# Patient Record
Sex: Female | Born: 1935 | Race: White | Hispanic: No | Smoking: Never smoker
Health system: Southern US, Community
[De-identification: ages and names within clinical notes are randomized; demographics above are authoritative.]

## PROBLEM LIST (undated history)

## (undated) DIAGNOSIS — K219 Gastro-esophageal reflux disease without esophagitis: Secondary | ICD-10-CM

## (undated) DIAGNOSIS — R112 Nausea with vomiting, unspecified: Secondary | ICD-10-CM

## (undated) DIAGNOSIS — E039 Hypothyroidism, unspecified: Secondary | ICD-10-CM

## (undated) DIAGNOSIS — Z9889 Other specified postprocedural states: Secondary | ICD-10-CM

## (undated) DIAGNOSIS — D509 Iron deficiency anemia, unspecified: Secondary | ICD-10-CM

## (undated) DIAGNOSIS — N183 Chronic kidney disease, stage 3 unspecified: Secondary | ICD-10-CM

## (undated) DIAGNOSIS — J9601 Acute respiratory failure with hypoxia: Secondary | ICD-10-CM

## (undated) DIAGNOSIS — Z9289 Personal history of other medical treatment: Secondary | ICD-10-CM

## (undated) DIAGNOSIS — I10 Essential (primary) hypertension: Secondary | ICD-10-CM

## (undated) DIAGNOSIS — M109 Gout, unspecified: Secondary | ICD-10-CM

## (undated) DIAGNOSIS — M1990 Unspecified osteoarthritis, unspecified site: Secondary | ICD-10-CM

## (undated) DIAGNOSIS — C801 Malignant (primary) neoplasm, unspecified: Secondary | ICD-10-CM

## (undated) DIAGNOSIS — H9190 Unspecified hearing loss, unspecified ear: Secondary | ICD-10-CM

## (undated) DIAGNOSIS — I5030 Unspecified diastolic (congestive) heart failure: Secondary | ICD-10-CM

## (undated) DIAGNOSIS — F329 Major depressive disorder, single episode, unspecified: Secondary | ICD-10-CM

## (undated) DIAGNOSIS — F32A Depression, unspecified: Secondary | ICD-10-CM

## (undated) DIAGNOSIS — K589 Irritable bowel syndrome without diarrhea: Secondary | ICD-10-CM

## (undated) HISTORY — PX: CATARACT EXTRACTION: SUR2

## (undated) HISTORY — PX: JOINT REPLACEMENT: SHX530

## (undated) HISTORY — PX: TOTAL KNEE ARTHROPLASTY: SHX125

## (undated) HISTORY — PX: BACK SURGERY: SHX140

## (undated) HISTORY — PX: VAGINAL HYSTERECTOMY: SUR661

---
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
## 1958-10-16 HISTORY — PX: TUBAL LIGATION: SHX77

## 1978-10-16 HISTORY — PX: LUMBAR DISC SURGERY: SHX700

## 1988-10-15 HISTORY — PX: CARDIAC CATHETERIZATION: SHX172

## 1997-10-24 ENCOUNTER — Observation Stay (HOSPITAL_COMMUNITY): Admission: RE | Admit: 1997-10-24 | Discharge: 1997-10-24 | Payer: Self-pay | Admitting: General Surgery

## 1998-02-14 DIAGNOSIS — Z9289 Personal history of other medical treatment: Secondary | ICD-10-CM

## 1998-02-14 HISTORY — DX: Personal history of other medical treatment: Z92.89

## 1998-11-03 ENCOUNTER — Encounter: Payer: Self-pay | Admitting: Specialist

## 1998-11-10 ENCOUNTER — Inpatient Hospital Stay (HOSPITAL_COMMUNITY): Admission: RE | Admit: 1998-11-10 | Discharge: 1998-11-14 | Payer: Self-pay | Admitting: Specialist

## 1999-03-30 ENCOUNTER — Encounter: Payer: Self-pay | Admitting: Emergency Medicine

## 1999-03-30 ENCOUNTER — Emergency Department (HOSPITAL_COMMUNITY): Admission: EM | Admit: 1999-03-30 | Discharge: 1999-03-30 | Payer: Self-pay | Admitting: Emergency Medicine

## 1999-10-05 ENCOUNTER — Encounter: Admission: RE | Admit: 1999-10-05 | Discharge: 1999-10-05 | Payer: Self-pay | Admitting: Family Medicine

## 1999-10-05 ENCOUNTER — Encounter: Payer: Self-pay | Admitting: Family Medicine

## 2000-10-05 ENCOUNTER — Encounter: Admission: RE | Admit: 2000-10-05 | Discharge: 2000-10-05 | Payer: Self-pay | Admitting: Obstetrics & Gynecology

## 2000-10-05 ENCOUNTER — Encounter: Payer: Self-pay | Admitting: Obstetrics & Gynecology

## 2001-09-18 ENCOUNTER — Encounter: Admission: RE | Admit: 2001-09-18 | Discharge: 2001-09-18 | Payer: Self-pay | Admitting: Gastroenterology

## 2001-09-18 ENCOUNTER — Encounter: Payer: Self-pay | Admitting: Gastroenterology

## 2001-12-12 ENCOUNTER — Encounter: Payer: Self-pay | Admitting: Family Medicine

## 2001-12-12 ENCOUNTER — Encounter: Admission: RE | Admit: 2001-12-12 | Discharge: 2001-12-12 | Payer: Self-pay | Admitting: Family Medicine

## 2002-06-19 ENCOUNTER — Ambulatory Visit (HOSPITAL_COMMUNITY): Admission: RE | Admit: 2002-06-19 | Discharge: 2002-06-19 | Payer: Self-pay | Admitting: Gastroenterology

## 2003-03-21 ENCOUNTER — Encounter: Admission: RE | Admit: 2003-03-21 | Discharge: 2003-03-21 | Payer: Self-pay | Admitting: Family Medicine

## 2004-05-06 ENCOUNTER — Encounter: Admission: RE | Admit: 2004-05-06 | Discharge: 2004-05-06 | Payer: Self-pay | Admitting: Family Medicine

## 2004-11-29 ENCOUNTER — Encounter: Admission: RE | Admit: 2004-11-29 | Discharge: 2004-11-29 | Payer: Self-pay | Admitting: Nephrology

## 2005-02-14 HISTORY — PX: TOTAL KNEE ARTHROPLASTY: SHX125

## 2005-04-29 ENCOUNTER — Ambulatory Visit: Payer: Self-pay

## 2005-09-07 ENCOUNTER — Encounter: Admission: RE | Admit: 2005-09-07 | Discharge: 2005-09-07 | Payer: Self-pay | Admitting: Family Medicine

## 2005-11-29 ENCOUNTER — Inpatient Hospital Stay (HOSPITAL_COMMUNITY): Admission: RE | Admit: 2005-11-29 | Discharge: 2005-12-02 | Payer: Self-pay | Admitting: Orthopedic Surgery

## 2006-03-09 ENCOUNTER — Ambulatory Visit: Payer: Self-pay | Admitting: Cardiovascular Disease

## 2006-03-17 ENCOUNTER — Ambulatory Visit: Payer: Self-pay

## 2006-03-31 ENCOUNTER — Ambulatory Visit: Payer: Self-pay | Admitting: Cardiovascular Disease

## 2006-04-05 ENCOUNTER — Inpatient Hospital Stay (HOSPITAL_BASED_OUTPATIENT_CLINIC_OR_DEPARTMENT_OTHER): Admission: RE | Admit: 2006-04-05 | Discharge: 2006-04-05 | Payer: Self-pay | Admitting: Cardiovascular Disease

## 2006-04-05 ENCOUNTER — Ambulatory Visit: Payer: Self-pay | Admitting: Cardiovascular Disease

## 2006-04-13 ENCOUNTER — Ambulatory Visit: Payer: Self-pay | Admitting: Cardiovascular Disease

## 2006-04-13 LAB — CONVERTED CEMR LAB
CO2: 29 meq/L (ref 19–32)
Calcium: 9.7 mg/dL (ref 8.4–10.5)
GFR calc Af Amer: 44 mL/min
GFR calc non Af Amer: 36 mL/min
Glucose, Bld: 91 mg/dL (ref 70–99)
Potassium: 4.5 meq/L (ref 3.5–5.1)

## 2006-09-28 ENCOUNTER — Encounter: Admission: RE | Admit: 2006-09-28 | Discharge: 2006-09-28 | Payer: Self-pay | Admitting: Family Medicine

## 2007-10-24 ENCOUNTER — Encounter: Admission: RE | Admit: 2007-10-24 | Discharge: 2007-10-24 | Payer: Self-pay | Admitting: Family Medicine

## 2008-05-22 ENCOUNTER — Inpatient Hospital Stay (HOSPITAL_COMMUNITY): Admission: RE | Admit: 2008-05-22 | Discharge: 2008-05-23 | Payer: Self-pay | Admitting: Neurological Surgery

## 2008-11-11 ENCOUNTER — Encounter: Admission: RE | Admit: 2008-11-11 | Discharge: 2008-11-11 | Payer: Self-pay | Admitting: Family Medicine

## 2009-11-13 ENCOUNTER — Encounter: Admission: RE | Admit: 2009-11-13 | Discharge: 2009-11-13 | Payer: Self-pay | Admitting: Family Medicine

## 2010-05-26 LAB — BASIC METABOLIC PANEL
BUN: 16 mg/dL (ref 6–23)
Calcium: 10.3 mg/dL (ref 8.4–10.5)
Creatinine, Ser: 1.38 mg/dL — ABNORMAL HIGH (ref 0.4–1.2)
GFR calc non Af Amer: 38 mL/min — ABNORMAL LOW (ref >60)
Glucose, Bld: 106 mg/dL — ABNORMAL HIGH (ref 70–99)
Potassium: 4.2 mEq/L (ref 3.5–5.1)

## 2010-05-26 LAB — CBC
Platelets: 217 10*3/uL (ref 150–400)
RDW: 14.3 % (ref 11.5–15.5)
WBC: 9.6 10*3/uL (ref 4.0–10.5)

## 2010-06-29 NOTE — Op Note (Signed)
NAMETAMAYA, FOARD NO.:  [id number]   MEDICAL RECORD NO.:  [id number]          PATIENT TYPE:  INP   LOCATION:  [id number]                         FACILITY:  [facility]   PHYSICIAN:  Earleen Newport, M.D.  DATE OF BIRTH:  December 01, 1933   DATE OF PROCEDURE:  05/22/2008  DATE OF DISCHARGE:                               OPERATIVE REPORT   PREOPERATIVE DIAGNOSES:  Lumbar spondylosis and lumbar stenosis, L3-L4  with bilateral lumbar radiculopathy.   POSTOPERATIVE DIAGNOSES:  Lumbar spondylosis and lumbar stenosis, L3-L4  with bilateral lumbar radiculopathy.   PROCEDURES:  Bilateral laminotomies and foraminotomies L3-4 with  operating microscope microdissection technique.   SURGEON:  Earleen Newport, MD   FIRST ASSISTANT:  Marchia Meiers. Vertell Limber, MD   ANESTHESIA:  General endotracheal.   INDICATIONS:  Stacy Morris is a 77 year old individual, who has had  progressive difficulty with walking because of symptoms of neurogenic  claudication combined with lumbar radiculopathy.  She has evidence of  advanced spondylitic stenosis at L3-L4 with retrolisthesis of  approximately 3 mm and severe hypertrophy of her posterior elements  causing compromise in the central canal, but also in the exiting L4  nerve roots.  She is now being taken to the operating room to undergo  surgical decompression via bilateral laminotomies and foraminotomies.   PROCEDURE:  The patient was brought to the operating room supine on the  stretcher.  After smooth induction of general endotracheal anesthesia,  she was turned prone.  The back was prepped with alcohol and DuraPrep  and draped in sterile fashion.  A vertical incision was created over the  L3-L4 area, which was localized with a radiograph.  Dissection was  carried down to the lumbodorsal fascia, which was opened on either side  of midline.  Subperiosteal dissection was performed to reveal the  interlaminar space at L3-L4.  Ligamentous material was  removed from the  posterior arch of the spine and then a high-speed bur was used with a 5-  mm acorn bit to remove the inferior margin of lamina of L3 out to the  medial wall of facet.  Partial facetectomy was also performed.  A 2.8-mm  dissecting tool was then used to remove the superior medial portion of  facet of L4.  Yellow ligament was thickened and redundant in this area,  was taken up with 2- and 3-mm Kerrison punch.  Ultimately, the common  dural tube was identified.  Dissection was then carefully performed  cephalad under the arch of L3 to remove ligamentous material and some  bony hypertrophy and also to widen, partial facetectomy had been  performed.  Ultimately, a common dural tube and the takeoff the L4 nerve  root was decompressed, and the decompression removed also ligamentous  material that was compromising the L3 nerve root superiorly.  This was  first done on the left side.  A similar procedure was then carried out  on the right side where similar findings were noted.  Ultimately, the  common dural tube and both L3 and L4 nerve roots were bilaterally  decompressed.  Hemostasis from epidural veins  was checked with some  bipolar cautery.  The disk space itself was then examined.  There was  noted to be the retrolisthesis of L3-L4 with some bulging centrally of  the disk; however, there was no tear in the ligament.  At this point,  the area was irrigated with antibiotic irrigating solution proximally  0.5 mL of Marcaine was injected into the  posterior longitudinal ligament and then the lumbodorsal fascia was  closed with #1 Vicryl in interrupted fashion, 2-0 Vicryl was used on  subcutaneous tissues, 3-0 Vicryl subcuticularly.  A 20 mL of Marcaine  was injected into the paraspinous musculature also.      Earleen Newport, M.D.  Electronically Signed     HJE/MEDQ  D:  05/22/2008  T:  05/22/2008  Job:  [id number]

## 2010-07-02 NOTE — Assessment & Plan Note (Signed)
[facility] OFFICE NOTE   NALAYSIA, [name]                      MRN:          [id number]  DATE:03/09/2006                            DOB:          1933-04-19    Stacy Morris is a delightful 77 year old patient I have known for a long  time.  I have taken care of her husband, Stacy Morris, for quite some time.  He  has significant heart problems with cardiomyopathy, coronary disease,  congestive heart failure and aortic valve replacement.   The patient has had coronary risk factors which include hypertension,  family history for coronary disease.   In June 2007 while waiting for her husband to get a PIC line, she had a  presyncopal episode with some nausea.  Workup for this was negative, and  she has not had any recurrences.  There was a question of whether this  was vasovagal event.  In talking to the patient, she has had over the  last month or so a couple of episodes of resting chest and jaw pain.  She actually does have a cavity in the right side of her jaw which needs  filling.  There has also been a question of some discharge from her  right ear.  The jaw pain can occur at night.  It has woken her up on 2  occasions.   It is not classically exertional.   The patient has not had a previous stress test or cardiac workup.  There  is no history of coronary disease or valvular disease.   She is a nonsmoker and has not had any chronic lung problems.   REVIEW OF SYSTEMS:  There is a question of previous problems with her  kidneys from her childhood, however, we did an ultrasound of her kidneys  here in March 2007, and there was no evidence of renal artery stenosis.  They kidneys were of normal size with the right kidney being 9.3 cm and  the left being 10.  She does follow up with a nephrologist here in town.  Her creatinine is usually normal, although I think this Monday it was up  to 1.7.   Her blood pressure  has been fluctuating recently, and her  hydrochlorothiazide was added back.   FAMILY HISTORY:  Remarkable for her mother dying of a heart problem, but  at age 26.  Father had heart problems in his 105s including strokes.   MEDICATIONS:  1. Lisinopril 20 a day.  2. Atenolol 50 a day.  3. Aspirin a day.  4. Synthroid 50 mcg a day.  5. Cholestyramine.  6. Hydrochlorothiazide 12.5 a day.  7. Prilosec.  8. Os-Cal.   She is retired.  Her husband's health is improving, but still fragile.  They just celebrated their 53rd anniversary this past Tuesday.   She has had previous gallbladder surgery, hysterectomy and 2 knee  replacements.  Despite this, she actually walks fairly well.   EXAMINATION:  Blood pressure is 138/70.  Pulse is 70 and regular.  HEENT:  Normal.  LUNGS:  Clear.  Her carotids  are normal.  There is no bruits.  There is an S1 and S2.  Normal heart sounds.  ABDOMEN:  Benign.  She has had a hysterectomy.  She is status post bilateral knee replacement.  Pulses are intact with  no edema.   Her EKG is essentially normal.   IMPRESSION:  Atypical chest and jaw pain.  I encouraged her to go see  the dentist and possibly an ears, nose and throat doctor.  She probably  needs to be on some antibiotics.  The pain is atypical for angina in  that it is nonexertional.  However, due to her risk factors and age, she  should have a stress Myoview.  I think she can walk for this.   Her hypertension seems reasonably well controlled.  She will continue  her Lisinopril.  She will have to follow up with [facility] in regards to possible prerenal azotemia.  It may be that her  hydrochlorothiazide should be stopped.  Again, I do not know all of the  details about her kidney problems, but I suspect she has had the 24-hour  urine protein in the past.  She clearly does not have an atrophic  kidney, and at least her renal Duplex here in the office would suggest  that she can  continue her ACE inhibitor for blood pressure control.   As long as her stress Myoview is normal, I will see her back only when I  see her husband, Stacy Morris, and she can follow up with [place] regarding  her general medical care.   Again, I encouraged her strongly to see a dentist and get some of her  ears, nose and throat issues resolved.  Also, follow up with the  nephrologist in terms of her kidneys.     Wallis Bamberg. Johnsie Cancel, MD, [facility]  Electronically Signed    PCN/MedQ  DD: 03/09/2006  DT: 03/09/2006  Job #: [id number]   cc:   Carolann Littler, M.D.  Mechele Dawley, MD

## 2010-07-02 NOTE — Assessment & Plan Note (Signed)
[facility] OFFICE NOTE   SREENIDHI, MARALDO                      MRN:          [id number]  DATE:03/31/2006                            DOB:          Jul 02, 1933    Emmajo returns today for followup. She is the wife of one of my long-  term patients. I saw her as a consult on January 24. She has been having  chest pain with coronary risk factors including hypertension and family  history.   She had had a presyncopal episode in June and then has been having chest  pain and jaw pain.   Her Myoview unfortunately showed anterior apical wall ischemia although  this may represent asymmetric breast attenuation. I had a long  discussion with Stefannie today regarding further workup given her  symptoms, risk factors, and abnormal Myoview. I gave her an option of  trying to get a cardiac CT approve through [facility] or  having an invasive heart cath and after a 10 or 15 minute discussion  about the risks and different scenarios, she felt comfortable having an  invasive heart cath.   I did discuss all of the risks with her including bleeding, risk of  stroke, need for emergency surgery, dye reaction and bleeding. She  understands and actually has been on the waiting end of this multiple  times with her husband [place].   PHYSICAL EXAMINATION:  GENERAL:  She looks well.  VITAL SIGNS:  Blood pressure is 130/60, pulse 70 and regular.  HEENT:  Normal. Carotids are without bruit.  LUNGS:  Clear.  HEART:  There is an S1, S2 without murmur, rub, gallop or click.  ABDOMEN:  Benign.  EXTREMITIES:  Lower extremities intact pulses, no edema.   MEDICATIONS:  1. Synthroid 50 mcg a day.  2. Lisinopril 20 a day.  3. Prilosec 20 a day.  4. Atenolol 50 a day.  5. Cholestyramine.  6. Baby aspirin a day.  7. Os-Cal.  8. Hydrochlorothiazide 12.5 a day.  9. Ferrex.   She is allergic to CODEINE.   IMPRESSION:  Chest  and jaw pain with presyncopal episode, Myoview  suggesting anterior apical wall ischemia, continue aspirin and beta  blocker. The patient will hold her diuretic prior to  the cath. We will get a chest x-ray and screening labs today. We will  try to arrange her diagnostic catheter next week. Further  recommendations will be based on the results of this.     Wallis Bamberg. Johnsie Cancel, MD, [facility]  Electronically Signed    PCN/MedQ  DD: 03/31/2006  DT: 03/31/2006  Job #: [id number]

## 2010-07-02 NOTE — Op Note (Signed)
   Stacy Morris, Stacy Morris                         ACCOUNT NO.:  [id number]   MEDICAL RECORD NO.:  [id number]                   PATIENT TYPE:  AMB   LOCATION:  ENDO                                 FACILITY:  [facility]   PHYSICIAN:  Nelwyn Salisbury, M.D.               DATE OF BIRTH:  October 12, 1933   DATE OF PROCEDURE:  06/19/2002  DATE OF DISCHARGE:                                 OPERATIVE REPORT   PROCEDURE:  Colonoscopy.   ENDOSCOPIST:  Nelwyn Salisbury, M.D.   INSTRUMENT USED:  Olympus video colonoscope.   INDICATION FOR PROCEDURE:  A 77 year old white female undergoing screening  colonoscopy.  The patient has a history of diarrhea that resolved with  Questran.  Rule out colonic polyps, masses, etc.   PREPROCEDURE PREPARATION:  Informed consent was procured from the patient.  The patient was fasted for eight hours prior to the procedure and prepped  with a bottle of magnesium citrate and a gallon of GoLYTELY the night prior  to the procedure.   PREPROCEDURE PHYSICAL:  VITAL SIGNS:  The patient had stable vital signs.  NECK:  Supple.  CHEST:  Clear to auscultation.  S1, S2 regular.  ABDOMEN:  Soft with normal bowel sounds.   DESCRIPTION OF PROCEDURE:  The patient was placed in the left lateral  decubitus position and sedated with 80 mg of Demerol and 8 mg of Versed  intravenously.  Once the patient was adequately sedate and maintained on low-  flow oxygen and continuous cardiac monitoring, the Olympus video colonoscope  was advanced from the rectum to the cecum with slight difficulty.  The  patient had a very tortuous colon and had scattered diverticulosis in the  left colon.  No other abnormalities were noted.  No masses or polyps were  present.  Retroflexion in the rectum revealed no abnormalities.   IMPRESSION:  Scattered left colonic diverticulosis, otherwise normal  colonoscopy.   RECOMMENDATIONS:  1. Continue Questran.  2.     Repeat CRC screening in the next 10 years  unless the patient develops any     abnormal symptoms in the interim.  3. A high-fiber diet with 20-25 g of fiber in the diet and liberal fluid     intake.  4. Outpatient follow-up as needed.                                               Nelwyn Salisbury, M.D.    JNM/MEDQ  D:  06/19/2002  T:  06/20/2002  Job:  [id number]   cc:   Juanita Craver, M.D.  [address]  [place]  [place] [address]  Fax: [phone]

## 2010-07-02 NOTE — Assessment & Plan Note (Signed)
[place] OFFICE NOTE   Stacy Morris, Stacy Morris                      MRN:          [id number]  DATE:04/13/2006                            DOB:          08/14/1933    Stacy Morris returns today for followup.  I did a heart cath on the 20th.  She had normal coronary arteries, good LV function.   She continues to have borderline blood pressure control.  Prior to the  cath, her creatinine was 1.7.  I was little bit concerned about this.  I  think for the time being we will stop her hydrochlorothiazide and  increase her lisinopril to 40 a day.  Since her cath, she did have some  significant pain in her right groin, however, it seems to be improving.  There was no visible ecchymosis.   I told Theia that so long as she continues to improve, we could watch  her.  However, if her pain were to return, or if she were to develop any  new hematoma, she will need a CT of the abdomen and pelvis.  She may  very well have had a small retroperitoneal bleed.   PHYSICAL EXAMINATION:  The blood pressure is 149/77, pulse 60 and  regular.  HEENT:  Normal.  Carotid is normal without bruit.  LUNGS:  Clear.  There is an S1, S2, normal heart sounds.  ABDOMEN:  Benign.  The right groin cath site is actually well healed.  There is no  ecchymosis.  There is some skin irritation from the prep, but there is  no bruit.  Distal pulses are intact with no edema.   IMPRESSION:  Stable.  Previous chest pain with cath showing widely  patent arteries.  Hypertension, continue atenolol at current dose,  increase lisinopril to 40 mg a day, and hold diuretic.  She will have a  followup BMET today, given her previously elevated creatinine and dye  that she received during her cath.  I will see her back in 3 months.  She will call us if her leg symptoms worsen or if she has any difficulty  with urination.     Wallis Bamberg. Johnsie Cancel, MD, [facility]  Electronically  Signed    PCN/MedQ  DD: 04/13/2006  DT: 04/13/2006  Job #: [id number]

## 2010-07-02 NOTE — Discharge Summary (Signed)
Stacy Morris, Stacy Morris NO.:  [id number]   MEDICAL RECORD NO.:  [id number]          PATIENT TYPE:  INP   LOCATION:  [place]                         FACILITY:  [facility]   PHYSICIAN:  Pietro Cassis. Alvan Dame, M.D.  DATE OF BIRTH:  1933-09-26   DATE OF ADMISSION:  11/29/2005  DATE OF DISCHARGE:  12/02/2005                                 DISCHARGE SUMMARY   ADMISSION DIAGNOSES:  1. Osteoarthritis.  2. Status post knee joint replacement.  3. Hypertension.  4. Hypothyroidism.  5. Gastroesophageal reflux.  6. Hypertriglyceridemia.   DISCHARGE DIAGNOSES:  1. Osteoarthritis.  2. Status post knee joint replacement.  3. Hypertension.  4. Hypothyroidism.  5. Gastroesophageal reflux.  6. Hypertriglyceridemia.   PROCEDURE:  The patient was admitted to the hospital for a right knee  replacement, surgeon Pietro Cassis. Alvan Dame, M.D., first [place].  Delorse Lek, P.A.-C, second assistant [place] Collene Mares, PA-C.  Components used  #3 femoral component, 38 mm patella, #3 tibial tray, 12.5 tibial insert.   CONSULTATIONS:  None.   HISTORY OF PRESENT ILLNESS:  Ms. Hashemi is a pleasant, 77 year old female  with a history of a left total knee arthroplasty in 2000, and problems x4  years in her right knee.  The pain has been unbearable and radiates down  into her leg.  She has been unable to walk without the use of a walker.  All  conservative measures have failed and has elected to proceed with a right  knee replacement.   LABORATORY DATA AND X-RAY FINDINGS:  Preadmission labs with complete blood  count showing her hemoglobin to be 12.4, hematocrit 35.9.  On November 30, 2005, her white blood cell count was 11.5, red blood cell 3.01, hemoglobin  9.6, hematocrit 27.9.  On October 18, predischarge, hemoglobin 9.7,  hematocrit 28.1.  Preadmission white cell differential within normal limits.  Preadmission coagulations within normal limits.  Preadmission chemistry  showed her BUN and  creatinine to be 26 and 1.5 respectively.  Upon  discharge, her glucose was 118, BUN 27, creatinine 1.7.  On October 18, her  glucose was 131 and creatinine 1.5.  Preadmission urinalysis negative.  Preadmission blood bank testing was A positive.  Preadmission EKG showed  sinus bradycardia.   HOSPITAL COURSE:  The patient tolerated the procedure well.  She was  admitted to the orthopedic floor.  The patient remained neuromuscularly and  vascularly intact throughout the course of her stay.  Her wound and dressing  and was checked on postop day #1 and found to be clean, dry and intact.  No  Hemovac was placed during the time of surgery.  Dressing was changed on  postop day #2.  The wound was intact without any signs of erythema or  oozing.  On day #3, prior to discharge, her knee continued to be dry and  intact.  The pain was controlled well with Talwin and she does have codeine  allergies.  She experienced no respiratory distress or no severe calf pain  during the course of her stay.  After her first day, she had very little  pain.  She stated that she might want to be discharged on the second day.  On the second day, she found that she was feeling a little bit more pain  than postop day #1, and felt that she could stay an extra day in order to  stabilize and be able to handle the activities and ambulatory requirements.  Physical therapy assessment was done on postop day #1.  Evaluation found  prior to this that she was completely independent in her activities and that  she had difficulty with walking and completing bed transfers.  Physical  therapy did progress well during the week as on October 18, she was able to  ambulate 100 feet with a rolling walker with supervision and then 150 feet  with a rolling walker with supervision.  By October 19, she was able to walk  with a rolling walker without a knee immobilizer 120 feet.  She was able to  complete bed mobility transfers.  She was able  to ambulate satisfactory to  take care of activities of daily living.  She was also instructed on how to  go up and down stairs.  They did recommend frequency of at least seven times  a week for the first week.  Physical therapy was continued with physical  therapy three to five times a week afterwards.  After her course and stay  and the physical therapy and rehabilitation, the patient was ready to be  discharged home after postop day #3.   CONDITION ON DISCHARGE:  Discharged home and was stable and improved.   DIET:  Regular as tolerated by patient.   DISCHARGE MEDICATIONS:  1. Lisinopril 20 mg p.o. one q.a.m.  2. Levothyroxine 50 mcg p.o. one q.a.m.  3. Atenolol 50 mg one p.o. q.a.m.  4. Prilosec OTC 20 mg p.o. one q.a.m.  5. Hydrochlorothiazide 12.5 p.o. one q.a.m.  6. Viactiv OTC p.o. one q.a.m.  7. Talwin one tablet p.o. q.4 h. p.r.n. pain.  8. Lovenox 30 mg one subcu q.12 h. x11 days.  9. Robaxin 500 mg one to two p.o. q.4-6h h. p.r.n. muscle spasm pain.  10.Colace 100 mg one p.o. b.i.d. p.r.n. constipation.  11.Iron 325 mg p.o. three times a day x4 weeks.  12.Enteric coated aspirin 325 x3 weeks after completion of her Lovenox      medications.   ACTIVITY:  She should continue to utilizing rolling walker for the 11 days  postdischarge.  She should increase her weightbearing as tolerated.  Goals  of physical therapy will be to maximize the range of motion, minimize pain,  improve muscle strength, promote ambulation and encourage independence in  performing the activities of daily living.  After 4 weeks with these goals  in mind, she should be able to proceed to the use of cane and then after 4  more weeks, hopefully after a total of 6 weeks, she will proceed to walking  independently.   WOUND CARE:  Please keep wound dry.  Change dressing daily as needed.   FOLLOW UP:  The patient should follow up with Dr. Alvan Dame in 8-10 days at [phone], for suture removal and postop  check.  If her condition worsens, she  should give [place] a call.  If she develops any acute respiratory distress or  severe calf pain, she should call emergency services immediately.  It has  been a pleasure to work with Ms. Bennette.     ______________________________  Carlean Jews. Collene Mares, [place]  Pietro Cassis Alvan Dame, M.D.  Electronically Signed    BLM/MEDQ  D:  12/15/2005  T:  12/15/2005  Job:  [id number]

## 2010-07-02 NOTE — Op Note (Signed)
Stacy Morris, Stacy Morris               ACCOUNT NO.:  [id number]   MEDICAL RECORD NO.:  [id number]          PATIENT TYPE:  INP   LOCATION:  0001                         FACILITY:  [facility]   PHYSICIAN:  Pietro Cassis. Alvan Dame, M.D.  DATE OF BIRTH:  1933/05/16   DATE OF PROCEDURE:  11/29/2005  DATE OF DISCHARGE:                                 OPERATIVE REPORT   PREOPERATIVE DIAGNOSIS:  Osteoarthritis right knee.   POSTOPERATIVE DIAGNOSIS:  Osteoarthritis right knee.   OPERATION/PROCEDURE:  Right total knee replacement.  Components used was the  DePuy rotating platform, posterior stabilized knee system, size 3 femur,  size 3 tibia, 12.5 posterior stabilized insert, 38 patella button.   SURGEON:  Pietro Cassis. Alvan Dame, M.D.   ASSISTANTElodia Florence. Delorse Lek, P.A.-C.  Marland Kitchen L. Collene Mares, PA-C.   ANESTHESIA:  Dermal spinal.   SPECIMENS:  None.   DRAINS:  One.   TOURNIQUET TIME:  45 minutes at 250 mmHg.   COMPLICATIONS:  None.   INDICATIONS FOR PROCEDURE:  Stacy Morris is a pleasant 77 year old female who  presented to the office for evaluation of her right knee.  She had a history  of left total knee replacement in 2000.  Her problems began approximately  four years ago.  She has steadily progressed and has had results of  unbearable pain radiating down into the leg.  She has had decreased quality  of life due to her discomfort and failed conservative measures and wants to  proceed with right knee replacement.  We did review the risks and benefits  of this procedure and she had been through it seven years ago.  The left  knee at this point is stable and no the bothering her.  Consent obtained.   DESCRIPTION OF PROCEDURE:  The patient was brought to the operative theater.  Once adequate anesthesia and preoperative antibiotics with 2g of Ancef were  administered, the patient was positioned supine with the proximal thigh  tourniquet placed.  The right lower extremity was then prepped and draped in  the sterile fashion.   A midline incision was made followed by median parapatellar arthrotomy.  The  patella was subluxated, not everted.  Knee exposure was obtained, rotating  it.  Following debridement of osteophytes and soft tissues for exposure, the  intramedullary canal was opened, irrigated to prevent fat emboli.   The intramedullary device was passed in 5 degrees of valgus and 10 mm of  bone was resected off the distal femur.  I sized the femur to be a size 3.  The size 3 cutting box was placed and anterior, posterior and chamfer cuts  were made with no notching.  The patient's femoral trochlears were noted to  be somewhat deficient relative to the final component.   Following this, the tochlear box cut was made based off the lateral aspect  of the distal femur.  Tibial exposure was then carried out including  meniscectomy.  I then resected 10 mm of bone, measured off the proximal mid  portion of the lateral tibial plateau surface.  This amounted to about a 3  mm cut off the medial plateau.  Osteophytes were debrided.  Further meniscus  removed.  Following this debridement, I checked my gap.  I was able to get a  10 mm spacing block in.  At this point pins were removed and alignment was  checked.  We placed the 3 mm tibial tray on the top of the tibia, and pinned  its location in the medial part of the tibial tubercle, then checked  alignment.  The alignment passed through the center of the ankle and it was  perpendicular in both planes.  Given this, I went ahead and drilled and keel  punched this rotation.  Trial reduction was carried out 3 femurs and 3 tibia  with 10 mm insert.  The knee came out to full extension.  I went ahead and  kept the components in place and went to the patella.  Patella exposure was  obtained including debridement across the suprapatellar region and the quad  tendon.   I then made a precut measurement of 24 mm and resected down to 14 to 15 mm  in that  range and then placed a 38 patella button.  I drilled these holes.  The patella tracked out.  Application of any thumb pressure.  Given this the  final components were brought into the field with the insert held.  Following removal of the components, I did finish up the debridement in the  posterior of the knee, removed any loose fragments of bone that I could see  or feel.   The knee was then copiously irrigated with normal saline solution, pulse  lavage into the knee.  I then injected the knee with 50 mL of Marcaine with  epinephrine and 1 mL of Toradol.  Final components were brought to the  field.  Cement mixed.  The components were cemented into position with the  tibia first, then the femur, then the patella.  I used a 10 mm insert  initially. Once the cement had cured, I trialed with a 12.5 insert and found  the knee came out of full extension but was more stable with extension then  flexion.   The knee was irrigated and further cement debrided and the posterior aspect  of the knee, then the final 12.5 insert placed.  The knee was irrigated  again with pulse lavage.  Medium Hemovac drain was placed deep.  The  tourniquet was let down at 45 minutes.  I reapproximated the extension  mechanism in flexion with #1 Vicryl.  The remainder of the wound was closed  in layers with 2-0 Vicryl and 4-0 running Monocryl.  The patient was brought  to the recovery room in stable condition.      Pietro Cassis Alvan Dame, M.D.  Electronically Signed     MDO/MEDQ  D:  11/29/2005  T:  11/30/2005  Job:  [id number]

## 2010-07-02 NOTE — Cardiovascular Report (Signed)
[name], Stacy Morris               ACCOUNT NO.:  [id number]   MEDICAL RECORD NO.:  [id number]          PATIENT TYPE:  OIB   LOCATION:  1961                         FACILITY:  [facility]   PHYSICIAN:  Peter C. Johnsie Cancel, MD, FACCDATE OF BIRTH:  08-28-1933   DATE OF PROCEDURE:  04/05/2006  DATE OF DISCHARGE:  04/05/2006                            CARDIAC CATHETERIZATION   PROCEDURES:  Coronary arteriography.   INDICATIONS:  Chest pain.   RISK FACTORS:  An abnormal Myoview.   Cine catheterization was done with 4-French catheters from right femoral  artery.   Left main coronary was normal.   Left anterior descending artery was normal.   There is an intermediate branch which was normal.  First diagonal branch  was normal.  The left circumflex was normal and codominant.   Right coronary artery was codominant and normal.   RAO ventriculography: RAO ventriculography was normal. Ejection fraction  was 65%.  There was no gradient across the aortic valve and no MR.  Aortic pressure was 130/46; LV pressure was 130/12.   IMPRESSION:  The patient's chest pain would appear to be noncardiac in  etiology.  She has had a false positive Myoview. Her arteries look quite  good for her age and risk factors.  She will continue with her blood  pressure medication. Her diuretics were held prior to the procedure.  They will be held for two more days since her creatinine is 1.7.   We will have to check her creatinine next week if she has any urinary  symptoms.   Overall she tolerated the procedure well.      Wallis Bamberg. Johnsie Cancel, MD, [facility]  Electronically Signed     PCN/MEDQ  D:  04/05/2006  T:  04/05/2006  Job:  [id number]

## 2010-07-02 NOTE — H&P (Signed)
Stacy Morris, Stacy Morris               ACCOUNT NO.:  [id number]   MEDICAL RECORD NO.:  [id number]          PATIENT TYPE:  INP   LOCATION:  NA                           FACILITY:  [facility]   PHYSICIAN:  Pietro Cassis. Alvan Dame, M.D.  DATE OF BIRTH:  [birth year]   DATE OF ADMISSION:  11/29/2005  DATE OF DISCHARGE:                                HISTORY & PHYSICAL   CHIEF COMPLAINT:  Right knee pain.   HISTORY OF PRESENT ILLNESS:  This is a very pleasant 77 year old female with  a history of left total knee arthroplasty in 2000 and problems for four  years in her right knee.  The pain has been unbearable and radiates down  into her leg.  She has been unable to walk without the use of a walker.  All  conservative measures have failed and has elected to proceed forth with a  right knee replacement.   PAST SURGICAL HISTORY:  1. Left total knee arthroplasty in 2000.  2. Tonsillectomy.  3. Appendectomy.  4. Hysterectomy.  5. Cholecystectomy.   PAST MEDICAL HISTORY:  1. Hypertension.  2. Hypothyroidism.  3. Some resolves renal issues.   FAMILY HISTORY:  Noncontributory.   SOCIAL HISTORY:  He is nonsmoking, retired, married.   ALLERGIES:  CODEINE causes extreme nausea.   MEDICATIONS:  1. Synthroid.  2. Lisinopril.  3. Atenolol.  4. Tylenol.  5. Prilosec.  6. HCTZ.  7. Os-Cal.   REVIEW OF SYSTEMS:  In the past two weeks, there has been no new onset of  any chest pain, shortness of breath, abdominal pain, diarrhea, constipation,  increase in urinary frequency or urinary pain.  No changes in vision, no  headaches, no new muscle pains or joint pains.   PHYSICAL EXAMINATION:  Temperature was 98.6, pulse was 64, respirations 18,  blood pressure 148/78 right arm sitting.  GENERAL:  The patient was awake, alert, oriented, well-developed, well-  nourished 77 year old female in no acute distress.  NECK:  Supple.  No lymphadenopathy, no carotid bruits noted.  CHEST:  Lungs were clear to  auscultation bilaterally.  No rales, rhonchi or  wheezes.  BREASTS:  Deferred.  HEART:  Regular rate and rhythm.  No gallops, clicks, rubs or murmurs.  S1-  S2 distinct.  No splitting noted.  ABDOMEN:  Soft, nontender, and nondistended.  Bowel sounds positive all four  quadrants.  GENITOURINARY:  Deferred.  EXTREMITIES:  She has a noted antalgic gait, a varus deformity, diffuse  tenderness over the right knee.  A dorsalis pedis pulse which is intact.  No  signs of lower extremity edema.  SKIN:  No rashes, open sores or skin breakdown noted.  NEUROLOGICAL:  She had intact distal sensibilities.   LABORATORY DATA:  Pending.  [facility] preoperative on November 24, 2005.  X-rays:  She has had AP, standing and lateral of her right knee  done.   IMPRESSION:  Shows advanced degenerative changes of her right knee with  osteoarthritis.   PLAN:  Plan of action is proceed with a right total knee arthroplasty on  November 29, 2005  at 7:15 a.m. by Dr. Mauri Pole.  All risks and  complications were discussed.  Questions were encouraged and answered to the  satisfaction of the patient.     ______________________________  Carlean Jews. Collene Mares, [place]      Pietro Cassis. Alvan Dame, M.D.  Electronically Signed    BLM/MEDQ  D:  11/21/2005  T:  11/22/2005  Job:  [id number]   cc:   [place]

## 2010-08-02 ENCOUNTER — Other Ambulatory Visit: Payer: Self-pay | Admitting: Family Medicine

## 2010-08-02 DIAGNOSIS — N6459 Other signs and symptoms in breast: Secondary | ICD-10-CM

## 2010-08-05 ENCOUNTER — Ambulatory Visit
Admission: RE | Admit: 2010-08-05 | Discharge: 2010-08-05 | Disposition: A | Discharge status: Home / Self Care | Payer: Medicare Other | Source: Ambulatory Visit | Attending: Family Medicine | Admitting: Family Medicine

## 2010-08-05 DIAGNOSIS — N6459 Other signs and symptoms in breast: Secondary | ICD-10-CM

## 2010-10-07 ENCOUNTER — Other Ambulatory Visit: Payer: Self-pay | Admitting: Family Medicine

## 2010-10-07 DIAGNOSIS — Z1231 Encounter for screening mammogram for malignant neoplasm of breast: Secondary | ICD-10-CM

## 2010-11-15 ENCOUNTER — Ambulatory Visit
Admission: RE | Admit: 2010-11-15 | Discharge: 2010-11-15 | Disposition: A | Discharge status: Home / Self Care | Payer: Medicare Other | Source: Ambulatory Visit | Attending: Family Medicine | Admitting: Family Medicine

## 2010-11-15 DIAGNOSIS — Z1231 Encounter for screening mammogram for malignant neoplasm of breast: Secondary | ICD-10-CM

## 2011-12-01 ENCOUNTER — Other Ambulatory Visit: Payer: Self-pay | Admitting: Family Medicine

## 2011-12-01 DIAGNOSIS — Z1231 Encounter for screening mammogram for malignant neoplasm of breast: Secondary | ICD-10-CM

## 2012-01-04 ENCOUNTER — Ambulatory Visit
Admission: RE | Admit: 2012-01-04 | Discharge: 2012-01-04 | Disposition: A | Discharge status: Home / Self Care | Payer: Medicare Other | Source: Ambulatory Visit | Attending: Family Medicine | Admitting: Family Medicine

## 2012-01-04 DIAGNOSIS — Z1231 Encounter for screening mammogram for malignant neoplasm of breast: Secondary | ICD-10-CM

## 2013-02-25 ENCOUNTER — Other Ambulatory Visit: Payer: Self-pay

## 2013-02-25 DIAGNOSIS — Z1231 Encounter for screening mammogram for malignant neoplasm of breast: Secondary | ICD-10-CM

## 2013-02-28 ENCOUNTER — Ambulatory Visit
Admission: RE | Admit: 2013-02-28 | Discharge: 2013-02-28 | Disposition: A | Discharge status: Home / Self Care | Payer: Medicare Other | Source: Ambulatory Visit

## 2013-02-28 DIAGNOSIS — Z1231 Encounter for screening mammogram for malignant neoplasm of breast: Secondary | ICD-10-CM

## 2014-02-25 ENCOUNTER — Other Ambulatory Visit: Payer: Self-pay

## 2014-02-25 DIAGNOSIS — Z1231 Encounter for screening mammogram for malignant neoplasm of breast: Secondary | ICD-10-CM

## 2014-03-04 ENCOUNTER — Ambulatory Visit
Admission: RE | Admit: 2014-03-04 | Discharge: 2014-03-04 | Disposition: A | Discharge status: Home / Self Care | Payer: Medicare PPO | Source: Ambulatory Visit

## 2014-03-04 DIAGNOSIS — Z1231 Encounter for screening mammogram for malignant neoplasm of breast: Secondary | ICD-10-CM

## 2014-04-09 ENCOUNTER — Inpatient Hospital Stay (HOSPITAL_COMMUNITY)
Admission: EM | Admit: 2014-04-09 | Discharge: 2014-04-11 | DRG: 683 | Disposition: A | Discharge status: Home / Self Care | Payer: Medicare PPO | Attending: Internal Medicine | Admitting: Internal Medicine

## 2014-04-09 ENCOUNTER — Emergency Department (HOSPITAL_COMMUNITY): Payer: Medicare PPO

## 2014-04-09 ENCOUNTER — Encounter (HOSPITAL_COMMUNITY): Payer: Self-pay | Admitting: Emergency Medicine

## 2014-04-09 DIAGNOSIS — R197 Diarrhea, unspecified: Secondary | ICD-10-CM

## 2014-04-09 DIAGNOSIS — M1990 Unspecified osteoarthritis, unspecified site: Secondary | ICD-10-CM | POA: Diagnosis present

## 2014-04-09 DIAGNOSIS — D696 Thrombocytopenia, unspecified: Secondary | ICD-10-CM | POA: Diagnosis present

## 2014-04-09 DIAGNOSIS — N179 Acute kidney failure, unspecified: Secondary | ICD-10-CM | POA: Diagnosis not present

## 2014-04-09 DIAGNOSIS — Z7982 Long term (current) use of aspirin: Secondary | ICD-10-CM

## 2014-04-09 DIAGNOSIS — N390 Urinary tract infection, site not specified: Secondary | ICD-10-CM | POA: Diagnosis present

## 2014-04-09 DIAGNOSIS — N189 Chronic kidney disease, unspecified: Secondary | ICD-10-CM

## 2014-04-09 DIAGNOSIS — I129 Hypertensive chronic kidney disease with stage 1 through stage 4 chronic kidney disease, or unspecified chronic kidney disease: Secondary | ICD-10-CM | POA: Diagnosis present

## 2014-04-09 DIAGNOSIS — E039 Hypothyroidism, unspecified: Secondary | ICD-10-CM | POA: Diagnosis present

## 2014-04-09 DIAGNOSIS — Z9049 Acquired absence of other specified parts of digestive tract: Secondary | ICD-10-CM | POA: Diagnosis present

## 2014-04-09 DIAGNOSIS — Z7952 Long term (current) use of systemic steroids: Secondary | ICD-10-CM

## 2014-04-09 DIAGNOSIS — R1110 Vomiting, unspecified: Secondary | ICD-10-CM | POA: Diagnosis not present

## 2014-04-09 DIAGNOSIS — M069 Rheumatoid arthritis, unspecified: Secondary | ICD-10-CM | POA: Diagnosis present

## 2014-04-09 DIAGNOSIS — I10 Essential (primary) hypertension: Secondary | ICD-10-CM | POA: Diagnosis present

## 2014-04-09 DIAGNOSIS — D899 Disorder involving the immune mechanism, unspecified: Secondary | ICD-10-CM | POA: Diagnosis present

## 2014-04-09 DIAGNOSIS — E860 Dehydration: Secondary | ICD-10-CM | POA: Diagnosis present

## 2014-04-09 DIAGNOSIS — R739 Hyperglycemia, unspecified: Secondary | ICD-10-CM | POA: Diagnosis present

## 2014-04-09 DIAGNOSIS — N184 Chronic kidney disease, stage 4 (severe): Secondary | ICD-10-CM | POA: Diagnosis present

## 2014-04-09 HISTORY — DX: Gastro-esophageal reflux disease without esophagitis: K21.9

## 2014-04-09 HISTORY — DX: Chronic kidney disease, stage 3 unspecified: N18.30

## 2014-04-09 HISTORY — DX: Essential (primary) hypertension: I10

## 2014-04-09 HISTORY — DX: Chronic kidney disease, stage 3 (moderate): N18.3

## 2014-04-09 HISTORY — DX: Major depressive disorder, single episode, unspecified: F32.9

## 2014-04-09 HISTORY — DX: Unspecified osteoarthritis, unspecified site: M19.90

## 2014-04-09 HISTORY — DX: Hypothyroidism, unspecified: E03.9

## 2014-04-09 HISTORY — DX: Gout, unspecified: M10.9

## 2014-04-09 HISTORY — DX: Malignant (primary) neoplasm, unspecified: C80.1

## 2014-04-09 HISTORY — DX: Iron deficiency anemia, unspecified: D50.9

## 2014-04-09 HISTORY — DX: Depression, unspecified: F32.A

## 2014-04-09 HISTORY — DX: Personal history of other medical treatment: Z92.89

## 2014-04-09 LAB — CBC WITH DIFFERENTIAL/PLATELET
BASOS ABS: 0 10*3/uL (ref 0.0–0.1)
BASOS PCT: 0 % (ref 0–1)
EOS PCT: 1 % (ref 0–5)
Eosinophils Absolute: 0.1 10*3/uL (ref 0.0–0.7)
HCT: 38.6 % (ref 36.0–46.0)
Hemoglobin: 12.5 g/dL (ref 12.0–15.0)
LYMPHS PCT: 4 % — AB (ref 12–46)
Lymphs Abs: 0.4 10*3/uL — ABNORMAL LOW (ref 0.7–4.0)
MCH: 30.6 pg (ref 26.0–34.0)
MCHC: 32.4 g/dL (ref 30.0–36.0)
MCV: 94.4 fL (ref 78.0–100.0)
Monocytes Absolute: 0.3 10*3/uL (ref 0.1–1.0)
Monocytes Relative: 4 % (ref 3–12)
NEUTROS ABS: 7.3 10*3/uL (ref 1.7–7.7)
NEUTROS PCT: 91 % — AB (ref 43–77)
PLATELETS: 181 10*3/uL (ref 150–400)
RBC: 4.09 MIL/uL (ref 3.87–5.11)
RDW: 14.3 % (ref 11.5–15.5)
WBC: 8.1 10*3/uL (ref 4.0–10.5)

## 2014-04-09 LAB — URINALYSIS, ROUTINE W REFLEX MICROSCOPIC
Bilirubin Urine: NEGATIVE
Glucose, UA: NEGATIVE mg/dL
Hgb urine dipstick: NEGATIVE
KETONES UR: NEGATIVE mg/dL
NITRITE: NEGATIVE
PH: 5.5 (ref 5.0–8.0)
Protein, ur: NEGATIVE mg/dL
Specific Gravity, Urine: 1.017 (ref 1.005–1.030)
Urobilinogen, UA: 0.2 mg/dL (ref 0.0–1.0)

## 2014-04-09 LAB — COMPREHENSIVE METABOLIC PANEL
ALBUMIN: 3.6 g/dL (ref 3.5–5.2)
ALT: 14 U/L (ref 0–35)
ANION GAP: 13 (ref 5–15)
AST: 23 U/L (ref 0–37)
Alkaline Phosphatase: 78 U/L (ref 39–117)
BILIRUBIN TOTAL: 1.1 mg/dL (ref 0.3–1.2)
BUN: 34 mg/dL — AB (ref 6–23)
CHLORIDE: 104 mmol/L (ref 96–112)
CO2: 24 mmol/L (ref 19–32)
CREATININE: 2.37 mg/dL — AB (ref 0.50–1.10)
Calcium: 9.8 mg/dL (ref 8.4–10.5)
GFR calc Af Amer: 21 mL/min — ABNORMAL LOW (ref >90)
GFR calc non Af Amer: 19 mL/min — ABNORMAL LOW (ref >90)
Glucose, Bld: 151 mg/dL — ABNORMAL HIGH (ref 70–99)
POTASSIUM: 3.8 mmol/L (ref 3.5–5.1)
SODIUM: 141 mmol/L (ref 135–145)
TOTAL PROTEIN: 7.6 g/dL (ref 6.0–8.3)

## 2014-04-09 LAB — CBC
HEMATOCRIT: 32.2 % — AB (ref 36.0–46.0)
Hemoglobin: 10.4 g/dL — ABNORMAL LOW (ref 12.0–15.0)
MCH: 30.6 pg (ref 26.0–34.0)
MCHC: 32.3 g/dL (ref 30.0–36.0)
MCV: 94.7 fL (ref 78.0–100.0)
PLATELETS: 147 10*3/uL — AB (ref 150–400)
RBC: 3.4 MIL/uL — ABNORMAL LOW (ref 3.87–5.11)
RDW: 14.5 % (ref 11.5–15.5)
WBC: 5.4 10*3/uL (ref 4.0–10.5)

## 2014-04-09 LAB — CREATININE, SERUM
CREATININE: 2.19 mg/dL — AB (ref 0.50–1.10)
GFR calc Af Amer: 24 mL/min — ABNORMAL LOW (ref >90)
GFR calc non Af Amer: 20 mL/min — ABNORMAL LOW (ref >90)

## 2014-04-09 LAB — I-STAT TROPONIN, ED: Troponin i, poc: 0 ng/mL (ref 0.00–0.08)

## 2014-04-09 LAB — URINE MICROSCOPIC-ADD ON

## 2014-04-09 LAB — TSH: TSH: 12.385 u[IU]/mL — AB (ref 0.350–4.500)

## 2014-04-09 LAB — LIPASE, BLOOD: Lipase: 42 U/L (ref 11–59)

## 2014-04-09 MED ORDER — ACETAMINOPHEN 325 MG PO TABS
650.0000 mg | ORAL_TABLET | Freq: Four times a day (QID) | ORAL | Status: DC | PRN
  Administered 2014-04-09 (×2): 650 mg via ORAL
  Filled 2014-04-09 (×2): qty 2

## 2014-04-09 MED ORDER — ATENOLOL 50 MG PO TABS
50.0000 mg | ORAL_TABLET | Freq: Every day | ORAL | Status: DC
  Administered 2014-04-09: 50 mg via ORAL
  Filled 2014-04-09: qty 2

## 2014-04-09 MED ORDER — SODIUM CHLORIDE 0.9 % IV SOLN
INTRAVENOUS | Status: AC
  Administered 2014-04-09: 13:00:00 via INTRAVENOUS

## 2014-04-09 MED ORDER — ONDANSETRON HCL 4 MG PO TABS: 4.0000 mg | ORAL_TABLET | Freq: Four times a day (QID) | ORAL | Status: DC | PRN

## 2014-04-09 MED ORDER — LEVOTHYROXINE SODIUM 50 MCG PO TABS
50.0000 ug | ORAL_TABLET | Freq: Every day | ORAL | Status: DC
  Administered 2014-04-10 – 2014-04-11 (×2): 50 ug via ORAL
  Filled 2014-04-09 (×3): qty 1

## 2014-04-09 MED ORDER — HYDRALAZINE HCL 20 MG/ML IJ SOLN: 10.0000 mg | Freq: Four times a day (QID) | INTRAMUSCULAR | Status: DC | PRN

## 2014-04-09 MED ORDER — SODIUM CHLORIDE 0.9 % IV BOLUS (SEPSIS)
1000.0000 mL | Freq: Once | INTRAVENOUS | Status: AC
  Administered 2014-04-09: 1000 mL via INTRAVENOUS

## 2014-04-09 MED ORDER — ACETAMINOPHEN 650 MG RE SUPP: 650.0000 mg | Freq: Four times a day (QID) | RECTAL | Status: DC | PRN

## 2014-04-09 MED ORDER — ASPIRIN 81 MG PO CHEW
81.0000 mg | CHEWABLE_TABLET | Freq: Every day | ORAL | Status: DC
  Administered 2014-04-09 – 2014-04-11 (×3): 81 mg via ORAL
  Filled 2014-04-09 (×3): qty 1

## 2014-04-09 MED ORDER — PREDNISONE 5 MG PO TABS
5.0000 mg | ORAL_TABLET | Freq: Every day | ORAL | Status: DC
  Administered 2014-04-10 – 2014-04-11 (×2): 5 mg via ORAL
  Filled 2014-04-09 (×4): qty 1

## 2014-04-09 MED ORDER — CITALOPRAM HYDROBROMIDE 20 MG PO TABS
40.0000 mg | ORAL_TABLET | Freq: Every day | ORAL | Status: DC
  Administered 2014-04-09 – 2014-04-11 (×3): 40 mg via ORAL
  Filled 2014-04-09: qty 2
  Filled 2014-04-09: qty 4
  Filled 2014-04-09: qty 2

## 2014-04-09 MED ORDER — ALUM & MAG HYDROXIDE-SIMETH 200-200-20 MG/5ML PO SUSP
30.0000 mL | Freq: Four times a day (QID) | ORAL | Status: DC | PRN
  Administered 2014-04-11: 30 mL via ORAL
  Filled 2014-04-09: qty 30

## 2014-04-09 MED ORDER — POTASSIUM CHLORIDE IN NACL 20-0.9 MEQ/L-% IV SOLN
INTRAVENOUS | Status: DC
  Administered 2014-04-09 – 2014-04-10 (×3): via INTRAVENOUS
  Filled 2014-04-09 (×5): qty 1000

## 2014-04-09 MED ORDER — ALPRAZOLAM 0.5 MG PO TABS
0.5000 mg | ORAL_TABLET | Freq: Three times a day (TID) | ORAL | Status: DC | PRN
  Administered 2014-04-09: 0.5 mg via ORAL
  Filled 2014-04-09: qty 1

## 2014-04-09 MED ORDER — CHOLESTYRAMINE 4 GM/DOSE PO POWD
4.0000 g | Freq: Two times a day (BID) | ORAL | Status: DC
  Administered 2014-04-09 – 2014-04-10 (×3): 4 g via ORAL
  Filled 2014-04-09 (×21): qty 1

## 2014-04-09 MED ORDER — ONDANSETRON HCL 4 MG/2ML IJ SOLN: 4.0000 mg | Freq: Four times a day (QID) | INTRAMUSCULAR | Status: DC | PRN

## 2014-04-09 MED ORDER — HEPARIN SODIUM (PORCINE) 5000 UNIT/ML IJ SOLN
5000.0000 [IU] | Freq: Three times a day (TID) | INTRAMUSCULAR | Status: DC
  Administered 2014-04-09 – 2014-04-10 (×2): 5000 [IU] via SUBCUTANEOUS
  Filled 2014-04-09: qty 1

## 2014-04-09 MED ORDER — ATENOLOL 50 MG PO TABS
50.0000 mg | ORAL_TABLET | Freq: Every day | ORAL | Status: DC
  Administered 2014-04-10 – 2014-04-11 (×2): 50 mg via ORAL
  Filled 2014-04-09 (×2): qty 1

## 2014-04-09 MED ORDER — PROMETHAZINE HCL 25 MG/ML IJ SOLN
12.5000 mg | Freq: Once | INTRAMUSCULAR | Status: AC
  Administered 2014-04-09: 12.5 mg via INTRAVENOUS
  Filled 2014-04-09: qty 1

## 2014-04-09 MED ORDER — PANTOPRAZOLE SODIUM 40 MG PO TBEC
40.0000 mg | DELAYED_RELEASE_TABLET | Freq: Every day | ORAL | Status: DC
  Administered 2014-04-09 – 2014-04-11 (×3): 40 mg via ORAL
  Filled 2014-04-09 (×3): qty 1

## 2014-04-09 MED ORDER — ONDANSETRON HCL 4 MG/2ML IJ SOLN
4.0000 mg | Freq: Once | INTRAMUSCULAR | Status: AC
  Administered 2014-04-09: 4 mg via INTRAVENOUS
  Filled 2014-04-09: qty 2

## 2014-04-09 MED ORDER — ETANERCEPT 50 MG/ML ~~LOC~~ SOSY: 50.0000 mg | PREFILLED_SYRINGE | SUBCUTANEOUS | Status: DC

## 2014-04-09 MED FILL — Cholestyramine Powder Packets 4 GM: ORAL | Qty: 1 | Status: AC

## 2014-04-09 NOTE — H&P (Signed)
[facility] History and Physical                                                                                    Stacy Morris, is a 81 y.o. female  MRN: [id number]   DOB - 1933/09/04  Admit Date - 04/09/2014  Outpatient Primary MD for the patient is Stacy Morris  Nephrologist:  Dr. Dana Allan  With History of -  Past Medical History  Diagnosis Date  . Hypertension   . Arthritis       Past Surgical History  Procedure Laterality Date  . Appendectomy    . Tonsillectomy    . Back surgery      in for   Chief Complaint  Patient presents with  . Emesis  . Nausea     HPI  Stacy Morris  is a 81 y.o. female, with a past medical history of hypertension and arthritis (for which she is on chronic prednisone 5 mg) presents to the emergency department with intractable vomiting and diarrhea. Per her daughter her symptoms started on Friday and lasted through the weekend. She improved and was feeling fine Tuesday (2/23), but awoke early this morning with vomiting. She describes being awakened from sleep to vomit yellow fluid. She denies hematemesis, hematochezia, melena. She is unable to keep down any solid food.  In the emergency department she appears drained and fatigued. Her creatinine baseline is 1.5 is currently elevated to 2.37. BUN is 34.  Review of Systems   In addition to the HPI above,  No problems swallowing food or Liquids, No Chest pain, Cough or Shortness of Breath, No Blood in stool or Urine, No dysuria, No new skin rashes or bruises, No new joints pains-aches,  No recent weight gain or loss, A full 10 point Review of Systems was done, except as stated above, all other Review of Systems were negative.  Social History History  Substance Use Topics  . Smoking status: Never Smoker   . Smokeless tobacco: Not on file  . Alcohol Use: No    Family History No family history on file.  Her father died of cardiac disease. Her  mother lived to be 50 the family is uncertain of what she passed with.  Prior to Admission medications   Medication Sig Start Date End Date Taking? Authorizing Provider  allopurinol (ZYLOPRIM) 100 MG tablet Take 100 mg by mouth daily.   Yes Historical Provider, MD  ALPRAZolam Duanne Moron) 0.5 MG tablet Take 0.5 mg by mouth 3 (three) times daily as needed for anxiety.   Yes Historical Provider, MD  aspirin 81 MG tablet Take 81 mg by mouth daily.   Yes Historical Provider, MD  atenolol (TENORMIN) 50 MG tablet Take 50 mg by mouth daily.   Yes Historical Provider, MD  Calcium Carbonate-Vitamin D (CALCIUM + D PO) Take 1 tablet by mouth daily. Take 1 tablet (600 mg-200 units) daily.   Yes Historical Provider, MD  cholestyramine Lucrezia Starch) 4 GM/DOSE powder Take 4 g by mouth as needed.   Yes Historical Provider, MD  citalopram (CELEXA) 40 MG tablet Take 40 mg by mouth daily.   Yes Historical Provider,  MD  etanercept (ENBREL) 50 MG/ML injection Inject 50 mg into the skin once a week.   Yes Historical Provider, MD  levothyroxine (SYNTHROID, LEVOTHROID) 50 MCG tablet Take 50 mcg by mouth daily before breakfast.   Yes Historical Provider, MD  loperamide (IMODIUM) 2 MG capsule Take 4 mg by mouth as needed for diarrhea or loose stools.   Yes Historical Provider, MD  losartan (COZAAR) 100 MG tablet Take 100 mg by mouth daily.   Yes Historical Provider, MD  omeprazole (PRILOSEC) 20 MG capsule Take 20 mg by mouth daily.   Yes Historical Provider, MD  predniSONE (DELTASONE) 5 MG tablet Take 5 mg by mouth daily with breakfast.   Yes Historical Provider, MD    Allergies  Allergen Reactions  . Codeine Nausea And Vomiting    Physical Exam  Vitals  Blood pressure 143/55, pulse 81, temperature 98.9 F (37.2 C), temperature source Oral, resp. rate 17, SpO2 92 %.   General:  Well-developed, obese, female lying in bed in NAD, appears fatigued. 2 sons and 1 daughter at bedside  Psych:  Normal affect and insight,  Not Suicidal or Homicidal, Awake Alert, Oriented X 3.  Neuro:   No F.N deficits, ALL C.Nerves Intact, Strength 5/5 all 4 extremities, Sensation intact all 4 extremities.  ENT:  Ears and Eyes appear Normal, Conjunctivae clear, PER. Moist oral mucosa without erythema or exudates.  Neck:  Supple, No lymphadenopathy appreciated  Respiratory:  Symmetrical chest wall movement, Good air movement bilaterally, CTAB.  Cardiac:  RRR, No Murmurs, no LE edema noted, no JVD.    Abdomen:  Positive bowel sounds, Soft, Non tender, Non distended,  No masses appreciated  Skin:  No Cyanosis, Normal Skin Turgor, No Skin Rash or Bruise.  Extremities:  Able to move all 4. 5/5 strength in each,  no effusions.  Data Review  CBC  Recent Labs Lab 04/09/14 0744  WBC 8.1  HGB 12.5  HCT 38.6  PLT 181  MCV 94.4  MCH 30.6  MCHC 32.4  RDW 14.3  LYMPHSABS 0.4*  MONOABS 0.3  EOSABS 0.1  BASOSABS 0.0    Chemistries   Recent Labs Lab 04/09/14 0744  NA 141  K 3.8  CL 104  CO2 24  GLUCOSE 151*  BUN 34*  CREATININE 2.37*  CALCIUM 9.8  AST 23  ALT 14  ALKPHOS 78  BILITOT 1.1    Imaging results:   Dg Abd Acute W/chest  04/09/2014   CLINICAL DATA:  Several day history of nausea and vomiting  EXAM: ACUTE ABDOMEN SERIES (ABDOMEN 2 VIEW & CHEST 1 VIEW)  COMPARISON:  Chest radiograph May 16, 2008  FINDINGS: PA chest: There is no edema or consolidation. The heart size and pulmonary vascularity are normal. No adenopathy.  Supine and upright abdomen: There is no bowel dilatation or air-fluid level suggesting obstruction. No free air. There is moderate stool in the colon. There are surgical clips in the right upper quadrant region.  IMPRESSION: Bowel gas pattern unremarkable. No obstruction or free air. No edema or consolidation.   Electronically Signed   By: Lowella Grip III M.D.   On: 04/09/2014 09:23    My personal review of EKG: NSR, No ST changes noted.   Assessment & Plan  Principal  Problem:   Renal failure (ARF), acute on chronic Active Problems:   Arthritis   HTN (hypertension)   Vomiting and diarrhea  Acute renal failure Secondary to dehydration from vomiting and diarrhea. At this point patient  is not urinating. Will give gentle IV fluids. Hold ARB. Monitor Is and Os to ensure she begins urinating. PT evaluation and orthostatic vitals requested for tomorrow.  Vomiting and diarrhea Appears to be a viral illness. Abdominal x-ray negative for obstruction. Have requested C. difficile PCR, and GI pathogen panel. Enteric precautions. I am concerned that she is immunocompromised as she takes daily prednisone. Supportive care with IV fluids, Zofran, Mylanta. Continue patient's home dose of cholestyramine. Clear liquid diet Will not start antibiotics at this point  Hypertension Continue atenolol. Hold ARB. Will add when necessary hydralazine.  Arthritis Continue chronic prednisone 5 mg daily.  Hyperglycemia No history of diabetes. Possibly due to chronic prednisone. Checking hemoglobin A1c.  DVT Prophylaxis: heparin  AM Labs Ordered, also please review Full Orders  Family Communication:   Daughter and sons at bedside  Code Status:  Full code  Condition:  Guarded but stable  Time spent in minutes : [address]   Imogene Burn,  PA-C on 04/09/2014 at 11:39 AM  Between 7am to 7pm - Pager - [phone]  After 7pm go to [email or web address] - password [email or web address]  And look for the night coverage person covering me after hours  [facility]

## 2014-04-09 NOTE — ED Notes (Signed)
Patient comes from Home, Patient states she started projectile  vomiting at 0300. Patient received 4mg  of zofran with EMS.

## 2014-04-09 NOTE — ED Provider Notes (Signed)
CSN: [id number]     Arrival date & time 04/09/14  [id number] History   First MD Initiated Contact with Patient 04/09/14 [phone]     Chief Complaint  Patient presents with  . Emesis  . Nausea     (Consider location/radiation/quality/duration/timing/severity/associated sxs/prior Treatment) The history is provided by the patient.  Stacy Morris is a 81 y.o. female hx of HTN, here with nausea, vomiting. She woke up at 3:30 AM, was unable to keep anything down since then. She has perfuse vomiting. Denies any diarrhea or abdominal pain. Denies any fevers or chills or urinary symptoms. Denies sick contacts.    Past Medical History  Diagnosis Date  . Hypertension   . Arthritis    Past Surgical History  Procedure Laterality Date  . Appendectomy    . Tonsillectomy    . Back surgery     No family history on file. History  Substance Use Topics  . Smoking status: Never Smoker   . Smokeless tobacco: Not on file  . Alcohol Use: No   OB History    No data available     Review of Systems  Gastrointestinal: Positive for vomiting.  All other systems reviewed and are negative.     Allergies  Codeine  Home Medications   Prior to Admission medications   Medication Sig Start Date End Date Taking? Authorizing Provider  allopurinol (ZYLOPRIM) 100 MG tablet Take 100 mg by mouth daily.   Yes Historical Provider, MD  ALPRAZolam Duanne Moron) 0.5 MG tablet Take 0.5 mg by mouth 3 (three) times daily as needed for anxiety.   Yes Historical Provider, MD  aspirin 81 MG tablet Take 81 mg by mouth daily.   Yes Historical Provider, MD  atenolol (TENORMIN) 50 MG tablet Take 50 mg by mouth daily.   Yes Historical Provider, MD  Calcium Carbonate-Vitamin D (CALCIUM + D PO) Take 1 tablet by mouth daily. Take 1 tablet (600 mg-200 units) daily.   Yes Historical Provider, MD  cholestyramine Lucrezia Starch) 4 GM/DOSE powder Take 4 g by mouth as needed.   Yes Historical Provider, MD  citalopram (CELEXA) 40 MG tablet Take  40 mg by mouth daily.   Yes Historical Provider, MD  etanercept (ENBREL) 50 MG/ML injection Inject 50 mg into the skin once a week.   Yes Historical Provider, MD  levothyroxine (SYNTHROID, LEVOTHROID) 50 MCG tablet Take 50 mcg by mouth daily before breakfast.   Yes Historical Provider, MD  loperamide (IMODIUM) 2 MG capsule Take 4 mg by mouth as needed for diarrhea or loose stools.   Yes Historical Provider, MD  losartan (COZAAR) 100 MG tablet Take 100 mg by mouth daily.   Yes Historical Provider, MD  omeprazole (PRILOSEC) 20 MG capsule Take 20 mg by mouth daily.   Yes Historical Provider, MD  predniSONE (DELTASONE) 5 MG tablet Take 5 mg by mouth daily with breakfast.   Yes Historical Provider, MD   Pulse 75  Temp(Src) 98.9 F (37.2 C) (Oral)  Resp 19  SpO2 95% Physical Exam  Constitutional: She is oriented to person, place, and time.  Dehydrated   HENT:  Head: Normocephalic.  MM dry   Eyes: Conjunctivae are normal. Pupils are equal, round, and reactive to light.  Neck: Normal range of motion. Neck supple.  Cardiovascular: Regular rhythm and normal heart sounds.   Slightly tachy   Pulmonary/Chest: Effort normal and breath sounds normal. No respiratory distress. She has no wheezes. She has no rales.  Abdominal: Soft. Bowel sounds  are normal. She exhibits no distension. There is no tenderness. There is no rebound and no guarding.  Musculoskeletal: Normal range of motion. She exhibits no edema or tenderness.  Neurological: She is alert and oriented to person, place, and time. No cranial nerve deficit. Coordination normal.  Skin: Skin is warm and dry.  Psychiatric: She has a normal mood and affect. Her behavior is normal. Judgment and thought content normal.  Nursing note and vitals reviewed.   ED Course  Procedures (including critical care time) Labs Review Labs Reviewed  CBC WITH DIFFERENTIAL/PLATELET - Abnormal; Notable for the following:    Neutrophils Relative % 91 (*)     Lymphocytes Relative 4 (*)    Lymphs Abs 0.4 (*)    All other components within normal limits  COMPREHENSIVE METABOLIC PANEL - Abnormal; Notable for the following:    Glucose, Bld 151 (*)    BUN 34 (*)    Creatinine, Ser 2.37 (*)    GFR calc non Af Amer 19 (*)    GFR calc Af Amer 21 (*)    All other components within normal limits  LIPASE, BLOOD  URINALYSIS, ROUTINE W REFLEX MICROSCOPIC  I-STAT TROPOININ, ED    Imaging Review Dg Abd Acute W/chest  04/09/2014   CLINICAL DATA:  Several day history of nausea and vomiting  EXAM: ACUTE ABDOMEN SERIES (ABDOMEN 2 VIEW & CHEST 1 VIEW)  COMPARISON:  Chest radiograph May 16, 2008  FINDINGS: PA chest: There is no edema or consolidation. The heart size and pulmonary vascularity are normal. No adenopathy.  Supine and upright abdomen: There is no bowel dilatation or air-fluid level suggesting obstruction. No free air. There is moderate stool in the colon. There are surgical clips in the right upper quadrant region.  IMPRESSION: Bowel gas pattern unremarkable. No obstruction or free air. No edema or consolidation.   Electronically Signed   By: Lowella Grip III M.D.   On: 04/09/2014 09:23     EKG Interpretation   Date/Time:  Wednesday April 09 2014 07:45:21 EST Ventricular Rate:  75 PR Interval:  170 QRS Duration: 102 QT Interval:  427 QTC Calculation: 477 R Axis:   38 Text Interpretation:  Sinus rhythm Borderline T abnormalities, anterior  leads Baseline wander in lead(s) V6 No significant change since last  tracing Confirmed by Sierrah Luevano  MD, Grace Haggart ([id number]) on 04/09/2014 8:08:50 AM      MDM   Final diagnoses:  Vomiting    Stacy Morris is a 81 y.o. female here with vomiting. Likely gastro. Will get labs, hydrate. Will reassess.   10:54 AM Has acute renal failure. Hasn't been able to keep things down much. Initially didn't want to get admitted. But hasn't able to urinate and sedated after one dose of phenergan. Unable to urinate  after 2 L NS. Will admit for dehydration.     Wandra Arthurs, MD 04/09/14 1055

## 2014-04-10 ENCOUNTER — Encounter (HOSPITAL_COMMUNITY): Payer: Self-pay | Admitting: General Practice

## 2014-04-10 DIAGNOSIS — N390 Urinary tract infection, site not specified: Secondary | ICD-10-CM

## 2014-04-10 DIAGNOSIS — A419 Sepsis, unspecified organism: Secondary | ICD-10-CM

## 2014-04-10 DIAGNOSIS — M1990 Unspecified osteoarthritis, unspecified site: Secondary | ICD-10-CM

## 2014-04-10 LAB — URINE CULTURE

## 2014-04-10 LAB — HEMOGLOBIN A1C
HEMOGLOBIN A1C: 5.6 % (ref 4.8–5.6)
Mean Plasma Glucose: 114 mg/dL

## 2014-04-10 LAB — CLOSTRIDIUM DIFFICILE BY PCR: CDIFFPCR: NEGATIVE

## 2014-04-10 MED ORDER — CETYLPYRIDINIUM CHLORIDE 0.05 % MT LIQD: 7.0000 mL | Freq: Two times a day (BID) | OROMUCOSAL | Status: DC

## 2014-04-10 MED ORDER — ATENOLOL 50 MG PO TABS: 50.0000 mg | ORAL_TABLET | Freq: Every day | ORAL | Status: DC

## 2014-04-10 MED ORDER — CEFTRIAXONE SODIUM IN DEXTROSE 20 MG/ML IV SOLN
1.0000 g | INTRAVENOUS | Status: DC
  Administered 2014-04-10 – 2014-04-11 (×2): 1 g via INTRAVENOUS
  Filled 2014-04-10 (×3): qty 50

## 2014-04-10 MED ORDER — CHLORHEXIDINE GLUCONATE 0.12 % MT SOLN: 15.0000 mL | Freq: Two times a day (BID) | OROMUCOSAL | Status: DC

## 2014-04-10 MED FILL — Cholestyramine Powder Packets 4 GM: ORAL | Qty: 1 | Status: AC

## 2014-04-10 NOTE — Evaluation (Signed)
Physical Therapy Evaluation Patient Details Name: Stacy Morris MRN: [id number] DOB: [birth year] Today's Date: 04/10/2014   History of Present Illness  Patient is a 81 y/o female admitted with acute kidney injury likely secondary to dehydration from vomiting and diarrhea. PMH of HTN, depression and CKD.    Clinical Impression  Patient presents with mild balance deficits, generalized weakness and deconditioning from hospitalization. May benefit from AD (rollator) for ambulation due to falls history and fatigability. Tolerated ambulation with Min guard assist for safety with dyspnea. Would benefit from skilled PT to improve higher level balance, maximize independence and minimize fall risk prior to return home.    Follow Up Recommendations Supervision - Intermittent    Equipment Recommendations  None recommended by PT    Recommendations for Other Services OT consult     Precautions / Restrictions Precautions Precautions: Fall Restrictions Weight Bearing Restrictions: No      Mobility  Bed Mobility Overal bed mobility: Needs Assistance Bed Mobility: Supine to Sit;Sit to Supine     Supine to sit: Modified independent (Device/Increase time);HOB elevated Sit to supine: Modified independent (Device/Increase time);HOB elevated      Transfers Overall transfer level: Needs assistance Equipment used: None Transfers: Sit to/from Stand Sit to Stand: Supervision         General transfer comment: Supervision for safety. Stood from [employer], from toilet x1.  Ambulation/Gait Ambulation/Gait assistance: Min guard Ambulation Distance (Feet): 200 Feet Assistive device: None Gait Pattern/deviations: Step-through pattern;Decreased stride length;Narrow base of support   Gait velocity interpretation: Below normal speed for age/gender General Gait Details: Pt with mildly unsteady gait. Dyspnea present. No overt LOB. May benefit from AD for community ambulation esp due to fall  hx.  Stairs            Wheelchair Mobility    Modified Rankin (Stroke Patients Only)       Balance Overall balance assessment: Needs assistance Sitting-balance support: Feet supported;No upper extremity supported Sitting balance-Leahy Scale: Good Sitting balance - Comments: Able to perform pericare and reach outside BoS without LOB or difficulty.    Standing balance support: During functional activity Standing balance-Leahy Scale: Fair Standing balance comment: Cannot tolerate challenges to balance.                              Pertinent Vitals/Pain Pain Assessment: No/denies pain    Home Living Family/patient expects to be discharged to:: Private residence Living Arrangements: Alone Available Help at Discharge: Family;Available PRN/intermittently Type of Home: House Home Access: Stairs to enter Entrance Stairs-Rails: Right Entrance Stairs-Number of Steps: 2 Home Layout: One level Home Equipment: Walker - 2 wheels;Cane - single point;Bedside commode      Prior Function Level of Independence: Independent         Comments: Pt reports driving, mowing her lawn and cooking. Reports 1 fall every 2-3 months due to being "clumsy"     Hand Dominance        Extremity/Trunk Assessment   Upper Extremity Assessment: Defer to OT evaluation;Overall WFL for tasks assessed           Lower Extremity Assessment: Generalized weakness         Communication   Communication: No difficulties  Cognition Arousal/Alertness: Awake/alert Behavior During Therapy: WFL for tasks assessed/performed Overall Cognitive Status: Within Functional Limits for tasks assessed  General Comments      Exercises        Assessment/Plan    PT Assessment Patient needs continued PT services  PT Diagnosis Generalized weakness;Difficulty walking   PT Problem List Decreased strength;Cardiopulmonary status limiting activity;Decreased activity  tolerance;Decreased balance;Decreased mobility  PT Treatment Interventions Balance training;Patient/family education;Functional mobility training;Therapeutic activities;Therapeutic exercise;Stair training;Gait training;DME instruction   PT Goals (Current goals can be found in the Care Plan section) Acute Rehab PT Goals Patient Stated Goal: to return home PT Goal Formulation: With patient Time For Goal Achievement: 04/24/14 Potential to Achieve Goals: Good    Frequency Min 3X/week   Barriers to discharge Decreased caregiver support Pt lives alone    Co-evaluation               End of Session Equipment Utilized During Treatment: Gait belt Activity Tolerance: Patient limited by fatigue Patient left: in bed;with call bell/phone within reach;with bed alarm set           Time: 1550-1605 PT Time Calculation (min) (ACUTE ONLY): 15 min   Charges:   PT Evaluation $Initial PT Evaluation Tier I: 1 Procedure     PT G CodesCandy Sledge A 05/01/14, 4:17 PM Candy Sledge, PT, DPT [phone]

## 2014-04-10 NOTE — Progress Notes (Addendum)
Patient ID: Stacy Morris, female   DOB: 1933-12-13, 81 y.o.   MRN: [id number] [facility] PROGRESS NOTE  KRYSTALYNN RIDGEWAY [id number] DOB: January 08, 1934 DOA: 2014/05/02 PCP: [place]  Brief narrative:    81 y.o. female, with a past medical history of hypertension and arthritis (for which she is on chronic prednisone 5 mg) who presented to [facility] ED with complaints of intractable vomiting and diarrhea for past few days prior to this admission. Patient was hemodynamically stable on admission. She was found to have acutely elevated creatinine at 2.37, per patient report 1.4 seems to be her baseline. She was started on IV fluids and admitted for further evaluation and management of nausea, vomiting and diarrhea.   Assessment/Plan:    Principal Problem: Vomiting and diarrhea - Probably a viral illness, gastroenteritis. She did not have a fever on the admission but spiked fever this morning. - Stool for C. difficile is pending. GI pathogen panel is pending. - Continue to provide supportive care with IV fluids, analgesia as needed, antiemetics as needed.  Active Problems: Acute on chronic renal failure, chronic kidney disease stage IV - GFR on this admission 19. Creatinine is 2.37 but has trended down to 2.1 with IV fluids. Baseline creatinine is 1.4. - Continue to monitor renal function  Sepsis secondary to urinary tract infection - Please note sepsis criteria are not met on the admission but 1 day after the admission. Patient spiked fever this morning and vital signs this morning included slight tachycardia, tachypnea. Urinalysis on the admission showed small leukocytes. It would be reasonable to start empiric Rocephin until urine culture is back.  Essential Hypertension - Continue atenolol  Arthritis - Continue chronic prednisone 5 mg daily.  Hypothyroidism - Continue Synthroid 50 g daily  Thrombocytopenia - Unclear etiology. Will stop subcutaneous  heparin did use SCDs bilaterally instead. - No reports of bleeding.    DVT Prophylaxis  - Heparin subcutaneous ordered but will switch to SCDs because of thrombocytopenia.   Code Status: Full.  Family Communication:  plan of care discussed with the patient and her family at the bedside Disposition Plan: Home when creatinine improves closer to baseline of 1.4.  IV access:  Peripheral IV  Procedures and diagnostic studies:    Dg Abd Acute W/chest 2014/05/02  Bowel gas pattern unremarkable. No obstruction or free air. No edema or consolidation.     Medical Consultants:  None   Other Consultants:  None  IAnti-Infectives:   None    Leisa Lenz, MD  [facility] Pager [phone]  If 7PM-7AM, please contact night-coverage [email or web address] Password [email or web address] 04/10/2014, 10:30 AM   LOS: 1 day    HPI/Subjective: No acute overnight events.  Objective: Filed Vitals:   05-02-2014 1430 05/02/14 1512 05-02-14 May 02, 2140 04/10/14 0500  BP: 105/33 127/54 130/46 152/58  Pulse: 73 72 66 60  Temp:  98.9 F (37.2 C) 99.4 F (37.4 C) 98.2 F (36.8 C)  TempSrc:  Oral Oral Oral  Resp: 18 18 18 18   SpO2: 92% 97% 91% 96%    Intake/Output Summary (Last 24 hours) at 04/10/14 1030 Last data filed at 04/10/14 5053  Gross per 24 hour  Intake   1221 ml  Output      0 ml  Net   1221 ml    Exam:   General:  Pt is alert, follows commands appropriately, not in acute distress  Cardiovascular: Regular rate and rhythm, S1/S2 (+)  Respiratory: Clear to auscultation bilaterally, no wheezing,  no crackles, no rhonchi  Abdomen: Soft, non tender, non distended, bowel sounds present  Extremities: No edema, pulses DP and PT palpable bilaterally  Neuro: Grossly nonfocal  Data Reviewed: Basic Metabolic Panel:  Recent Labs Lab 04/09/14 0744 04/09/14 1214  NA 141  --   K 3.8  --   CL 104  --   CO2 24  --   GLUCOSE 151*  --   BUN 34*  --   CREATININE 2.37* 2.19*  CALCIUM 9.8  --     Liver Function Tests:  Recent Labs Lab 04/09/14 0744  AST 23  ALT 14  ALKPHOS 78  BILITOT 1.1  PROT 7.6  ALBUMIN 3.6    Recent Labs Lab 04/09/14 0744  LIPASE 42   No results for input(s): AMMONIA in the last 168 hours. CBC:  Recent Labs Lab 04/09/14 0744 04/09/14 1214  WBC 8.1 5.4  NEUTROABS 7.3  --   HGB 12.5 10.4*  HCT 38.6 32.2*  MCV 94.4 94.7  PLT 181 147*   Cardiac Enzymes: No results for input(s): CKTOTAL, CKMB, CKMBINDEX, TROPONINI in the last 168 hours. BNP: Invalid input(s): POCBNP CBG: No results for input(s): GLUCAP in the last 168 hours.  No results found for this or any previous visit (from the past 240 hour(s)).   Scheduled Meds: . aspirin  81 mg Oral Daily  . atenolol  50 mg Oral Daily  . cholestyramine  4 g Oral BID  . citalopram  40 mg Oral Daily  . heparin  5,000 Units Subcutaneous 3 times per day  . levothyroxine  50 mcg Oral QAC breakfast  . pantoprazole  40 mg Oral Daily  . predniSONE  5 mg Oral Q breakfast   Continuous Infusions: . 0.9 % NaCl with KCl 20 mEq / L 75 mL/hr at 04/10/14 [id number]

## 2014-04-11 LAB — BASIC METABOLIC PANEL
ANION GAP: 4 — AB (ref 5–15)
BUN: 20 mg/dL (ref 6–23)
CHLORIDE: 109 mmol/L (ref 96–112)
CO2: 24 mmol/L (ref 19–32)
Calcium: 8.9 mg/dL (ref 8.4–10.5)
Creatinine, Ser: 1.64 mg/dL — ABNORMAL HIGH (ref 0.50–1.10)
GFR calc non Af Amer: 29 mL/min — ABNORMAL LOW (ref >90)
GFR, EST AFRICAN AMERICAN: 33 mL/min — AB (ref >90)
Glucose, Bld: 102 mg/dL — ABNORMAL HIGH (ref 70–99)
POTASSIUM: 4.1 mmol/L (ref 3.5–5.1)
Sodium: 137 mmol/L (ref 135–145)

## 2014-04-11 MED ORDER — CHOLESTYRAMINE 4 G PO PACK
4.0000 g | PACK | Freq: Two times a day (BID) | ORAL | Status: DC
  Administered 2014-04-11: 4 g via ORAL
  Filled 2014-04-11 (×2): qty 1

## 2014-04-11 MED ORDER — ALUM & MAG HYDROXIDE-SIMETH 200-200-20 MG/5ML PO SUSP
30.0000 mL | ORAL | Status: AC
  Administered 2014-04-11: 30 mL via ORAL
  Filled 2014-04-11: qty 30

## 2014-04-11 MED ORDER — LOPERAMIDE HCL 2 MG PO CAPS
2.0000 mg | ORAL_CAPSULE | ORAL | Status: AC
  Administered 2014-04-11: 2 mg via ORAL
  Filled 2014-04-11: qty 1

## 2014-04-11 NOTE — Discharge Instructions (Signed)
Chronic Kidney Disease °Chronic kidney disease occurs when the kidneys are damaged over a long period. The kidneys are two organs that lie on either side of the spine between the middle of the back and the front of the abdomen. The kidneys:  °· Remove wastes and extra water from the blood.   °· Produce important hormones. These help keep bones strong, regulate blood pressure, and help create red blood cells.   °· Balance the fluids and chemicals in the blood and tissues. °A small amount of kidney damage may not cause problems, but a large amount of damage may make it difficult or impossible for the kidneys to work the way they should. If steps are not taken to slow down the kidney damage or stop it from getting worse, the kidneys may stop working permanently. Most of the time, chronic kidney disease does not go away. However, it can often be controlled, and those with the disease can usually live normal lives. °CAUSES  °The most common causes of chronic kidney disease are diabetes and high blood pressure (hypertension). Chronic kidney disease may also be caused by:  °· Diseases that cause the kidneys' filters to become inflamed.   °· Diseases that affect the immune system.   °· Genetic diseases.   °· Medicines that damage the kidneys, such as anti-inflammatory medicines.   °· Poisoning or exposure to toxic substances.   °· A reoccurring kidney or urinary infection.   °· A problem with urine flow. This may be caused by:   °¨ Cancer.   °¨ Kidney stones.   °¨ An enlarged prostate in males. °SIGNS AND SYMPTOMS  °Because the kidney damage in chronic kidney disease occurs slowly, symptoms develop slowly and may not be obvious until the kidney damage becomes severe. A person may have a kidney disease for years without showing any symptoms. Symptoms can include:  °· Swelling (edema) of the legs, ankles, or feet.   °· Tiredness (lethargy).   °· Nausea or vomiting.   °· Confusion.   °· Problems with urination, such as:    °¨ Decreased urine production.   °¨ Frequent urination, especially at night.   °¨ Frequent accidents in children who are potty trained.   °· Muscle twitches and cramps.   °· Shortness of breath.  °· Weakness.   °· Persistent itchiness.   °· Loss of appetite. °· Metallic taste in the mouth. °· Trouble sleeping. °· Slowed development in children. °· Short stature in children. °DIAGNOSIS  °Chronic kidney disease may be detected and diagnosed by tests, including blood, urine, imaging, or kidney biopsy tests.  °TREATMENT  °Most chronic kidney diseases cannot be cured. Treatment usually involves relieving symptoms and preventing or slowing the progression of the disease. Treatment may include:  °· A special diet. You may need to avoid alcohol and foods that are salty and high in potassium.   °· Medicines. These may:   °¨ Lower blood pressure.   °¨ Relieve anemia.   °¨ Relieve swelling.   °¨ Protect the bones. °HOME CARE INSTRUCTIONS  °· Follow your prescribed diet.   °· Take medicines only as directed by your health care provider. Do not take any new medicines (prescription, over-the-counter, or nutritional supplements) unless approved by your health care provider. Many medicines can worsen your kidney damage or need to have the dose adjusted.   °· Quit smoking if you smoke. Talk to your health care provider about a smoking cessation program.   °· Keep all follow-up visits as directed by your health care provider. °SEEK IMMEDIATE MEDICAL CARE IF: °· Your symptoms get worse or you develop new symptoms.   °· You develop symptoms of end-stage kidney disease. These   include:   °¨ Headaches.   °¨ Abnormally dark or light skin.   °¨ Numbness in the hands or feet.   °¨ Easy bruising.   °¨ Frequent hiccups.   °¨ Menstruation stops.   °· You have a fever.   °· You have decreased urine production.   °· You have pain or bleeding when urinating. °MAKE SURE YOU: °· Understand these instructions. °· Will watch your condition. °· Will  get help right away if you are not doing well or get worse. °FOR MORE INFORMATION  °· American Association of Kidney Patients: www.aakp.org °· National Kidney Foundation: www.kidney.org °· American Kid[employer] Fund: www.akfinc.org °· [employer] [facility]: www.lifeoptions.org and www.kidneyschool.org °Document Released: 11/10/2007 Document Revised: 06/17/2013 Document Reviewed: 09/30/2011 °ExitCare® Patient Information ©2015 [employer]. This information is not intended to replace advice given to you by your health care provider. Make sure you discuss any questions you have with your health care provider. ° °

## 2014-04-11 NOTE — Progress Notes (Signed)
Physical Therapy Treatment Patient Details Name: Stacy Morris MRN: [id number] DOB: 11-Nov-1933 Today's Date: 04/11/2014    History of Present Illness Patient is a 81 y/o female admitted with acute kidney injury likely secondary to dehydration from vomiting and diarrhea. PMH of HTN, depression and CKD.    PT Comments    Patient progressing slowly with mobility. Requires encouragement. Improved ambulation distance however continues to exhibit dyspnea and fatigue. Declines use of RW to improve balance at this time. Tolerated stair negotiation with Min guard assist for safety. Encourage ambulation a few times/day with supervision to improve mobility/endurannce. Tech notified. Pt limited session and ambulation today due to concerns for diarrhea. Will continue to follow acutely.   Follow Up Recommendations  Supervision - Intermittent     Equipment Recommendations  None recommended by PT    Recommendations for Other Services       Precautions / Restrictions Precautions Precautions: Fall Restrictions Weight Bearing Restrictions: No    Mobility  Bed Mobility Overal bed mobility: Needs Assistance Bed Mobility: Supine to Sit     Supine to sit: Modified independent (Device/Increase time);HOB elevated        Transfers Overall transfer level: Needs assistance Equipment used: None Transfers: Sit to/from Stand Sit to Stand: Supervision         General transfer comment: Supervision for safety. Stood from [employer], from toilet x1.  Ambulation/Gait Ambulation/Gait assistance: Supervision Ambulation Distance (Feet): 300 Feet Assistive device: None Gait Pattern/deviations: Step-through pattern;Decreased stride length;Narrow base of support   Gait velocity interpretation: Below normal speed for age/gender General Gait Details: Pt with steady gait when not performing higher level balance challenges. Staggering to the right with head turns/movements. Dyspnea present. No overt LOB.  Declined using AD.   Stairs Stairs: Yes Stairs assistance: Min guard Stair Management: Step to pattern;One rail Right Number of Stairs: 3 General stair comments: Min guard for safety. Cues for technique.  Wheelchair Mobility    Modified Rankin (Stroke Patients Only)       Balance Overall balance assessment: Needs assistance Sitting-balance support: Feet supported;No upper extremity supported Sitting balance-Leahy Scale: Good Sitting balance - Comments: Able to perform pericare and reach outside BoS without LOB or difficulty.    Standing balance support: During functional activity Standing balance-Leahy Scale: Fair Standing balance comment: Able to reach outside BoS and wash hands at sink, brush hair without difficulty.              High level balance activites: Head turns;Turns High Level Balance Comments: Tolerated above activities with LOB however pt able to recover by using rail.    Cognition Arousal/Alertness: Awake/alert Behavior During Therapy: WFL for tasks assessed/performed Overall Cognitive Status: Within Functional Limits for tasks assessed                      Exercises      General Comments        Pertinent Vitals/Pain Pain Assessment: No/denies pain    Home Living                      Prior Function            PT Goals (current goals can now be found in the care plan section) Progress towards PT goals: Progressing toward goals    Frequency  Min 3X/week    PT Plan Current plan remains appropriate    Co-evaluation  End of Session Equipment Utilized During Treatment: Gait belt Activity Tolerance: Patient limited by fatigue Patient left: in chair;with call bell/phone within reach;with family/visitor present     Time: 1000-1020 PT Time Calculation (min) (ACUTE ONLY): 20 min  Charges:  $Gait Training: 8-22 mins                    G CodesCandy Sledge A 2014/04/14, 10:30 AM Candy Sledge,  [place], DPT [phone]

## 2014-04-11 NOTE — Progress Notes (Signed)
DC home with sons,verbally understood DC instructions. No questions asked

## 2014-04-11 NOTE — Discharge Summary (Signed)
Physician Discharge Summary  Stacy Morris:[id number] DOB: April 05, 1933 DOA: 04/09/2014  PCP: Stephens Shire, MD  Admit date: 04/09/2014 Discharge date: 04/11/2014  Recommendations for Outpatient Follow-up:  1. Pt instructed to continue her meds as pre prior to admission 2. May use imodium as needed to control diarrhea  3. Pt instructed to follow up with kidney MD per sch appt to recheck kidney function.  Discharge Diagnoses:  Principal Problem:   Renal failure (ARF), acute on chronic Active Problems:   Arthritis   HTN (hypertension)   Vomiting and diarrhea   Acute on chronic renal failure    Discharge Condition: stable   Diet recommendation: as tolerated   History of present illness:  81 y.o. female, with a past medical history of hypertension and arthritis (for which she is on chronic prednisone 5 mg) who presented to [facility] ED with complaints of intractable vomiting and diarrhea for past few days prior to this admission. Patient was hemodynamically stable on admission. She was found to have acutely elevated creatinine at 2.37, per patient report 1.4 seems to be her baseline. She was started on IV fluids and admitted for further evaluation and management of nausea, vomiting and diarrhea.  Assessment/Plan:    Principal Problem: Vomiting and diarrhea - Probably a viral illness, gastroenteritis.  - Stool for C. difficile is negative. GI pathogen panel is negative. - pt insists on going home today, stable for discharge.   Active Problems: Acute on chronic renal failure, chronic kidney disease stage IV - GFR on this admission 19. Creatinine is 2.37 but has trended down to 1.6 with IV fluids. Baseline creatinine is 1.4.  Sepsis secondary to urinary tract infection - Please note that sepsis criteria are not met on the admission but 1 day after the admission. Patient spiked fever 04/10/2014. She also had slight tachycardia, tachypnea. Urinalysis on the admission showed small  leukocytes. We started empiric rocephin. She had it for 3 days so no need to continue beyond discharge.  - Urine culture multiple species none predominant   Essential Hypertension - Continue atenolol  Arthritis - Continue prednisone 5 mg daily. Chronic dose.  Hypothyroidism - Continue Synthroid 50 g daily  Thrombocytopenia - Unclear etiology. We stopped subcutaneous heparin. - No reports of bleeding.    DVT Prophylaxis  - Heparin subcutaneous ordered but made switch to SCDs because of thrombocytopenia.   Code Status: Full.  Family Communication: plan of care discussed with the patient and her family at the bedside   IV access:  Peripheral IV  Procedures and diagnostic studies:   Dg Abd Acute W/chest 04/09/2014 Bowel gas pattern unremarkable. No obstruction or free air. No edema or consolidation.   Medical Consultants:  None   Other Consultants:  None  IAnti-Infectives:   None     Signed:  Leisa Lenz, MD  [facility] 04/11/2014, 1:38 PM  Pager #: [phone]  Discharge Exam: Filed Vitals:   04/11/14 1310  BP: 158/72  Pulse: 63  Temp:   Resp: 18   Filed Vitals:   04/11/14 0623 04/11/14 0917 04/11/14 1054 04/11/14 1310  BP: 189/66 180/68 160/61 158/72  Pulse: 64 63 64 63  Temp: 97.9 F (36.6 C)     TempSrc: Oral     Resp: 16 18 18 18   Height:      Weight:      SpO2: 95%   97%    General: Pt is alert, follows commands appropriately, not in acute distress Cardiovascular: Regular rate and rhythm,  S1/S2 appreciated  Respiratory: no wheezing, no crackles, no rhonchi Abdominal: non distended, bowel sounds +, no guarding Extremities: no edema, no cyanosis, pulses palpable bilaterally DP and PT Neuro: Grossly nonfocal  Discharge Instructions  Discharge Instructions    Call MD for:  difficulty breathing, headache or visual disturbances    Complete by:  As directed      Call MD for:  persistant nausea and vomiting     Complete by:  As directed      Call MD for:  severe uncontrolled pain    Complete by:  As directed      Diet - low sodium heart healthy    Complete by:  As directed      Increase activity slowly    Complete by:  As directed             Medication List    STOP taking these medications        atenolol 50 MG tablet  Commonly known as:  TENORMIN      TAKE these medications        allopurinol 100 MG tablet  Commonly known as:  ZYLOPRIM  Take 100 mg by mouth daily.     ALPRAZolam 0.5 MG tablet  Commonly known as:  XANAX  Take 0.5 mg by mouth 3 (three) times daily as needed for anxiety.     aspirin 81 MG tablet  Take 81 mg by mouth daily.     CALCIUM + D PO  Take 1 tablet by mouth daily. Take 1 tablet (600 mg-200 units) daily.     cholestyramine 4 GM/DOSE powder  Commonly known as:  QUESTRAN  Take 4 g by mouth as needed.     citalopram 40 MG tablet  Commonly known as:  CELEXA  Take 40 mg by mouth daily.     etanercept 50 MG/ML injection  Commonly known as:  ENBREL  Inject 50 mg into the skin once a week.     levothyroxine 50 MCG tablet  Commonly known as:  SYNTHROID, LEVOTHROID  Take 50 mcg by mouth daily before breakfast.     loperamide 2 MG capsule  Commonly known as:  IMODIUM  Take 4 mg by mouth as needed for diarrhea or loose stools.     losartan 100 MG tablet  Commonly known as:  COZAAR  Take 100 mg by mouth daily.     omeprazole 20 MG capsule  Commonly known as:  PRILOSEC  Take 20 mg by mouth daily.     predniSONE 5 MG tablet  Commonly known as:  DELTASONE  Take 5 mg by mouth daily with breakfast.           Follow-up Information    Follow up with BURNETT,BRENT A, MD. Schedule an appointment as soon as possible for a visit in 1 week.   Specialty:  Family Medicine   Why:  Follow up appt after recent hospitalization   Contact information:   [address] [place] [address]        The results of significant diagnostics  from this hospitalization (including imaging, microbiology, ancillary and laboratory) are listed below for reference.    Significant Diagnostic Studies: Dg Abd Acute W/chest  04/09/2014   CLINICAL DATA:  Several day history of nausea and vomiting  EXAM: ACUTE ABDOMEN SERIES (ABDOMEN 2 VIEW & CHEST 1 VIEW)  COMPARISON:  Chest radiograph May 16, 2008  FINDINGS: PA chest: There is no edema or  consolidation. The heart size and pulmonary vascularity are normal. No adenopathy.  Supine and upright abdomen: There is no bowel dilatation or air-fluid level suggesting obstruction. No free air. There is moderate stool in the colon. There are surgical clips in the right upper quadrant region.  IMPRESSION: Bowel gas pattern unremarkable. No obstruction or free air. No edema or consolidation.   Electronically Signed   By: Lowella Grip III M.D.   On: 04/09/2014 09:23    Microbiology: Recent Results (from the past 240 hour(s))  Clostridium Difficile by PCR     Status: None   Collection Time: 04/09/14  8:29 PM  Result Value Ref Range Status   C difficile by pcr NEGATIVE NEGATIVE Final  Urine culture     Status: None   Collection Time: 04/09/14  8:30 PM  Result Value Ref Range Status   Specimen Description URINE, RANDOM  Final   Special Requests NONE  Final   Colony Count   Final    50,000 COLONIES/ML Performed at [facility]    Culture   Final    Multiple bacterial morphotypes present, none predominant. Suggest appropriate recollection if clinically indicated. Performed at [facility]    Report Status 04/10/2014 FINAL  Final     Labs: Basic Metabolic Panel:  Recent Labs Lab 04/09/14 0744 04/09/14 1214 04/11/14 1145  NA 141  --  137  K 3.8  --  4.1  CL 104  --  109  CO2 24  --  24  GLUCOSE 151*  --  102*  BUN 34*  --  20  CREATININE 2.37* 2.19* 1.64*  CALCIUM 9.8  --  8.9   Liver Function Tests:  Recent Labs Lab 04/09/14 0744  AST 23  ALT 14  ALKPHOS 78   BILITOT 1.1  PROT 7.6  ALBUMIN 3.6    Recent Labs Lab 04/09/14 0744  LIPASE 42   No results for input(s): AMMONIA in the last 168 hours. CBC:  Recent Labs Lab 04/09/14 0744 04/09/14 1214  WBC 8.1 5.4  NEUTROABS 7.3  --   HGB 12.5 10.4*  HCT 38.6 32.2*  MCV 94.4 94.7  PLT 181 147*   Cardiac Enzymes: No results for input(s): CKTOTAL, CKMB, CKMBINDEX, TROPONINI in the last 168 hours. BNP: BNP (last 3 results) No results for input(s): BNP in the last 8760 hours.  ProBNP (last 3 results) No results for input(s): PROBNP in the last 8760 hours.  CBG: No results for input(s): GLUCAP in the last 168 hours.  Time coordinating discharge: Over 30 minutes

## 2014-04-12 LAB — GI PATHOGEN PANEL BY PCR, STOOL
C difficile toxin A/B: NOT DETECTED
Campylobacter by PCR: NOT DETECTED
Cryptosporidium by PCR: NOT DETECTED
E COLI (STEC): NOT DETECTED
E COLI 0157 BY PCR: NOT DETECTED
E coli (ETEC) LT/ST: NOT DETECTED
G lamblia by PCR: NOT DETECTED
Rotavirus A by PCR: NOT DETECTED
Salmonella by PCR: NOT DETECTED
Shigella by PCR: NOT DETECTED

## 2014-04-14 MED FILL — Cholestyramine Powder Packets 4 GM: ORAL | Qty: 1 | Status: AC

## 2015-04-13 ENCOUNTER — Other Ambulatory Visit: Payer: Self-pay

## 2015-04-13 DIAGNOSIS — Z1231 Encounter for screening mammogram for malignant neoplasm of breast: Secondary | ICD-10-CM

## 2015-04-17 ENCOUNTER — Ambulatory Visit
Admission: RE | Admit: 2015-04-17 | Discharge: 2015-04-17 | Disposition: A | Discharge status: Home / Self Care | Payer: Medicare Other | Source: Ambulatory Visit

## 2015-04-17 DIAGNOSIS — Z1231 Encounter for screening mammogram for malignant neoplasm of breast: Secondary | ICD-10-CM

## 2015-10-27 ENCOUNTER — Other Ambulatory Visit: Payer: Self-pay | Admitting: Family Medicine

## 2015-10-27 DIAGNOSIS — R0989 Other specified symptoms and signs involving the circulatory and respiratory systems: Secondary | ICD-10-CM

## 2015-11-02 ENCOUNTER — Ambulatory Visit
Admission: RE | Admit: 2015-11-02 | Discharge: 2015-11-02 | Disposition: A | Discharge status: Home / Self Care | Payer: Medicare Other | Source: Ambulatory Visit | Attending: Family Medicine | Admitting: Family Medicine

## 2015-11-02 DIAGNOSIS — R0989 Other specified symptoms and signs involving the circulatory and respiratory systems: Secondary | ICD-10-CM

## 2016-05-09 ENCOUNTER — Other Ambulatory Visit: Payer: Self-pay | Admitting: Physician Assistant

## 2016-05-09 DIAGNOSIS — Z1231 Encounter for screening mammogram for malignant neoplasm of breast: Secondary | ICD-10-CM

## 2016-06-01 ENCOUNTER — Other Ambulatory Visit: Payer: Self-pay | Admitting: Physician Assistant

## 2016-06-01 ENCOUNTER — Ambulatory Visit
Admission: RE | Admit: 2016-06-01 | Discharge: 2016-06-01 | Disposition: A | Discharge status: Home / Self Care | Payer: Medicare Other | Source: Ambulatory Visit | Attending: Physician Assistant | Admitting: Physician Assistant

## 2016-06-01 DIAGNOSIS — Z1231 Encounter for screening mammogram for malignant neoplasm of breast: Secondary | ICD-10-CM

## 2016-12-18 ENCOUNTER — Other Ambulatory Visit: Payer: Self-pay

## 2016-12-18 ENCOUNTER — Emergency Department (HOSPITAL_COMMUNITY): Payer: Medicare Other

## 2016-12-18 ENCOUNTER — Inpatient Hospital Stay (HOSPITAL_COMMUNITY)
Admission: EM | Admit: 2016-12-18 | Discharge: 2016-12-23 | DRG: 481 | Disposition: A | Discharge status: Rehabilitation Facility | Payer: Medicare Other | Attending: Internal Medicine | Admitting: Internal Medicine

## 2016-12-18 ENCOUNTER — Encounter (HOSPITAL_COMMUNITY): Payer: Self-pay | Admitting: Emergency Medicine

## 2016-12-18 DIAGNOSIS — M109 Gout, unspecified: Secondary | ICD-10-CM | POA: Diagnosis present

## 2016-12-18 DIAGNOSIS — Z7952 Long term (current) use of systemic steroids: Secondary | ICD-10-CM

## 2016-12-18 DIAGNOSIS — D62 Acute posthemorrhagic anemia: Secondary | ICD-10-CM | POA: Diagnosis not present

## 2016-12-18 DIAGNOSIS — Z96653 Presence of artificial knee joint, bilateral: Secondary | ICD-10-CM | POA: Diagnosis present

## 2016-12-18 DIAGNOSIS — F329 Major depressive disorder, single episode, unspecified: Secondary | ICD-10-CM | POA: Diagnosis present

## 2016-12-18 DIAGNOSIS — M9712XA Periprosthetic fracture around internal prosthetic left knee joint, initial encounter: Principal | ICD-10-CM | POA: Diagnosis present

## 2016-12-18 DIAGNOSIS — W19XXXA Unspecified fall, initial encounter: Secondary | ICD-10-CM | POA: Diagnosis present

## 2016-12-18 DIAGNOSIS — N183 Chronic kidney disease, stage 3 unspecified: Secondary | ICD-10-CM | POA: Diagnosis present

## 2016-12-18 DIAGNOSIS — S72492A Other fracture of lower end of left femur, initial encounter for closed fracture: Secondary | ICD-10-CM | POA: Diagnosis not present

## 2016-12-18 DIAGNOSIS — Z79899 Other long term (current) drug therapy: Secondary | ICD-10-CM | POA: Diagnosis not present

## 2016-12-18 DIAGNOSIS — Z419 Encounter for procedure for purposes other than remedying health state, unspecified: Secondary | ICD-10-CM | POA: Diagnosis not present

## 2016-12-18 DIAGNOSIS — T148XXA Other injury of unspecified body region, initial encounter: Secondary | ICD-10-CM

## 2016-12-18 DIAGNOSIS — E039 Hypothyroidism, unspecified: Secondary | ICD-10-CM | POA: Diagnosis not present

## 2016-12-18 DIAGNOSIS — D72829 Elevated white blood cell count, unspecified: Secondary | ICD-10-CM | POA: Diagnosis not present

## 2016-12-18 DIAGNOSIS — N179 Acute kidney failure, unspecified: Secondary | ICD-10-CM | POA: Diagnosis present

## 2016-12-18 DIAGNOSIS — I129 Hypertensive chronic kidney disease with stage 1 through stage 4 chronic kidney disease, or unspecified chronic kidney disease: Secondary | ICD-10-CM | POA: Diagnosis present

## 2016-12-18 DIAGNOSIS — R8290 Unspecified abnormal findings in urine: Secondary | ICD-10-CM | POA: Diagnosis not present

## 2016-12-18 DIAGNOSIS — N184 Chronic kidney disease, stage 4 (severe): Secondary | ICD-10-CM | POA: Diagnosis not present

## 2016-12-18 DIAGNOSIS — W19XXXS Unspecified fall, sequela: Secondary | ICD-10-CM | POA: Diagnosis not present

## 2016-12-18 DIAGNOSIS — K219 Gastro-esophageal reflux disease without esophagitis: Secondary | ICD-10-CM | POA: Diagnosis present

## 2016-12-18 DIAGNOSIS — Z7982 Long term (current) use of aspirin: Secondary | ICD-10-CM

## 2016-12-18 DIAGNOSIS — Z8739 Personal history of other diseases of the musculoskeletal system and connective tissue: Secondary | ICD-10-CM | POA: Diagnosis not present

## 2016-12-18 DIAGNOSIS — D631 Anemia in chronic kidney disease: Secondary | ICD-10-CM | POA: Diagnosis present

## 2016-12-18 DIAGNOSIS — S72492S Other fracture of lower end of left femur, sequela: Secondary | ICD-10-CM | POA: Diagnosis not present

## 2016-12-18 DIAGNOSIS — K5903 Drug induced constipation: Secondary | ICD-10-CM

## 2016-12-18 DIAGNOSIS — M25562 Pain in left knee: Secondary | ICD-10-CM | POA: Diagnosis present

## 2016-12-18 DIAGNOSIS — M069 Rheumatoid arthritis, unspecified: Secondary | ICD-10-CM | POA: Diagnosis present

## 2016-12-18 DIAGNOSIS — I150 Renovascular hypertension: Secondary | ICD-10-CM | POA: Diagnosis not present

## 2016-12-18 DIAGNOSIS — R2689 Other abnormalities of gait and mobility: Secondary | ICD-10-CM | POA: Diagnosis not present

## 2016-12-18 DIAGNOSIS — I10 Essential (primary) hypertension: Secondary | ICD-10-CM | POA: Diagnosis present

## 2016-12-18 DIAGNOSIS — Z885 Allergy status to narcotic agent status: Secondary | ICD-10-CM | POA: Diagnosis not present

## 2016-12-18 DIAGNOSIS — W19XXXD Unspecified fall, subsequent encounter: Secondary | ICD-10-CM | POA: Diagnosis not present

## 2016-12-18 DIAGNOSIS — S72462S Displaced supracondylar fracture with intracondylar extension of lower end of left femur, sequela: Secondary | ICD-10-CM | POA: Diagnosis not present

## 2016-12-18 DIAGNOSIS — R0989 Other specified symptoms and signs involving the circulatory and respiratory systems: Secondary | ICD-10-CM | POA: Diagnosis not present

## 2016-12-18 DIAGNOSIS — Y9222 Religious institution as the place of occurrence of the external cause: Secondary | ICD-10-CM | POA: Diagnosis not present

## 2016-12-18 DIAGNOSIS — S72462A Displaced supracondylar fracture with intracondylar extension of lower end of left femur, initial encounter for closed fracture: Secondary | ICD-10-CM | POA: Diagnosis not present

## 2016-12-18 DIAGNOSIS — G8918 Other acute postprocedural pain: Secondary | ICD-10-CM | POA: Diagnosis not present

## 2016-12-18 DIAGNOSIS — S8290XS Unspecified fracture of unspecified lower leg, sequela: Secondary | ICD-10-CM | POA: Diagnosis not present

## 2016-12-18 LAB — CBC WITH DIFFERENTIAL/PLATELET
Basophils Absolute: 0 10*3/uL (ref 0.0–0.1)
Basophils Relative: 0 %
EOS ABS: 0 10*3/uL (ref 0.0–0.7)
Eosinophils Relative: 1 %
HEMATOCRIT: 31.9 % — AB (ref 36.0–46.0)
HEMOGLOBIN: 10.2 g/dL — AB (ref 12.0–15.0)
LYMPHS ABS: 1.7 10*3/uL (ref 0.7–4.0)
Lymphocytes Relative: 19 %
MCH: 31.6 pg (ref 26.0–34.0)
MCHC: 32 g/dL (ref 30.0–36.0)
MCV: 98.8 fL (ref 78.0–100.0)
Monocytes Absolute: 0.9 10*3/uL (ref 0.1–1.0)
Monocytes Relative: 10 %
NEUTROS ABS: 6.2 10*3/uL (ref 1.7–7.7)
NEUTROS PCT: 70 %
Platelets: 185 10*3/uL (ref 150–400)
RBC: 3.23 MIL/uL — AB (ref 3.87–5.11)
RDW: 13.6 % (ref 11.5–15.5)
WBC: 8.9 10*3/uL (ref 4.0–10.5)

## 2016-12-18 LAB — COMPREHENSIVE METABOLIC PANEL
ALT: 16 U/L (ref 14–54)
ANION GAP: 7 (ref 5–15)
AST: 19 U/L (ref 15–41)
Albumin: 3.5 g/dL (ref 3.5–5.0)
Alkaline Phosphatase: 71 U/L (ref 38–126)
BUN: 40 mg/dL — ABNORMAL HIGH (ref 6–20)
CHLORIDE: 106 mmol/L (ref 101–111)
CO2: 24 mmol/L (ref 22–32)
CREATININE: 2.27 mg/dL — AB (ref 0.44–1.00)
Calcium: 9.6 mg/dL (ref 8.9–10.3)
GFR, EST AFRICAN AMERICAN: 22 mL/min — AB (ref >60)
GFR, EST NON AFRICAN AMERICAN: 19 mL/min — AB (ref >60)
Glucose, Bld: 111 mg/dL — ABNORMAL HIGH (ref 65–99)
POTASSIUM: 5 mmol/L (ref 3.5–5.1)
SODIUM: 137 mmol/L (ref 135–145)
Total Bilirubin: 1.1 mg/dL (ref 0.3–1.2)
Total Protein: 6.8 g/dL (ref 6.5–8.1)

## 2016-12-18 MED ORDER — POVIDONE-IODINE 10 % EX SWAB: 2.0000 "application " | Freq: Once | CUTANEOUS | Status: DC

## 2016-12-18 MED ORDER — PREDNISONE 5 MG PO TABS
5.0000 mg | ORAL_TABLET | Freq: Every day | ORAL | Status: DC
  Administered 2016-12-19 – 2016-12-23 (×4): 5 mg via ORAL
  Filled 2016-12-18 (×4): qty 1

## 2016-12-18 MED ORDER — LEVOTHYROXINE SODIUM 50 MCG PO TABS
50.0000 ug | ORAL_TABLET | Freq: Every day | ORAL | Status: DC
  Administered 2016-12-19 – 2016-12-23 (×4): 50 ug via ORAL
  Filled 2016-12-18 (×4): qty 1

## 2016-12-18 MED ORDER — ALPRAZOLAM 0.5 MG PO TABS: 0.5000 mg | ORAL_TABLET | Freq: Three times a day (TID) | ORAL | Status: DC | PRN

## 2016-12-18 MED ORDER — ONDANSETRON 4 MG PO TBDP
4.0000 mg | ORAL_TABLET | Freq: Once | ORAL | Status: AC
  Administered 2016-12-18: 4 mg via ORAL
  Filled 2016-12-18: qty 1

## 2016-12-18 MED ORDER — CEFAZOLIN SODIUM-DEXTROSE 2-4 GM/100ML-% IV SOLN
2.0000 g | INTRAVENOUS | Status: DC
  Filled 2016-12-18: qty 100

## 2016-12-18 MED ORDER — CHOLESTYRAMINE 4 G PO PACK
4.0000 g | PACK | ORAL | Status: DC | PRN
  Filled 2016-12-18: qty 1

## 2016-12-18 MED ORDER — ENOXAPARIN SODIUM 30 MG/0.3ML ~~LOC~~ SOLN
30.0000 mg | SUBCUTANEOUS | Status: DC
  Administered 2016-12-18: 30 mg via SUBCUTANEOUS

## 2016-12-18 MED ORDER — LOSARTAN POTASSIUM 50 MG PO TABS
100.0000 mg | ORAL_TABLET | Freq: Every day | ORAL | Status: DC
  Administered 2016-12-19: 100 mg via ORAL
  Filled 2016-12-18: qty 2

## 2016-12-18 MED ORDER — HYDROMORPHONE HCL 1 MG/ML IJ SOLN: 0.5000 mg | INTRAMUSCULAR | Status: DC | PRN

## 2016-12-18 MED ORDER — SENNA 8.6 MG PO TABS
1.0000 | ORAL_TABLET | Freq: Two times a day (BID) | ORAL | Status: DC
  Administered 2016-12-21 – 2016-12-23 (×4): 8.6 mg via ORAL
  Filled 2016-12-18 (×5): qty 1

## 2016-12-18 MED ORDER — PANTOPRAZOLE SODIUM 40 MG PO TBEC
40.0000 mg | DELAYED_RELEASE_TABLET | Freq: Every day | ORAL | Status: DC
  Administered 2016-12-21 – 2016-12-23 (×3): 40 mg via ORAL
  Filled 2016-12-18 (×3): qty 1

## 2016-12-18 MED ORDER — BISACODYL 5 MG PO TBEC: 5.0000 mg | DELAYED_RELEASE_TABLET | Freq: Every day | ORAL | Status: DC | PRN

## 2016-12-18 MED ORDER — HYDROMORPHONE HCL 1 MG/ML IJ SOLN
0.5000 mg | INTRAMUSCULAR | Status: DC | PRN
  Administered 2016-12-18 – 2016-12-20 (×6): 0.5 mg via INTRAVENOUS
  Filled 2016-12-18 (×6): qty 1

## 2016-12-18 MED ORDER — ASPIRIN 81 MG PO CHEW
81.0000 mg | CHEWABLE_TABLET | Freq: Every day | ORAL | Status: DC
  Administered 2016-12-21 – 2016-12-23 (×3): 81 mg via ORAL
  Filled 2016-12-18 (×3): qty 1

## 2016-12-18 MED ORDER — CALCIUM CARBONATE-VITAMIN D 500-200 MG-UNIT PO TABS
1.0000 | ORAL_TABLET | Freq: Every day | ORAL | Status: DC
  Administered 2016-12-21 – 2016-12-23 (×3): 1 via ORAL
  Filled 2016-12-18 (×3): qty 1

## 2016-12-18 MED ORDER — LOPERAMIDE HCL 2 MG PO CAPS: 4.0000 mg | ORAL_CAPSULE | ORAL | Status: DC | PRN

## 2016-12-18 MED ORDER — OXYCODONE HCL 5 MG PO TABS
15.0000 mg | ORAL_TABLET | ORAL | Status: DC | PRN
  Administered 2016-12-21 – 2016-12-22 (×3): 15 mg via ORAL
  Filled 2016-12-18 (×4): qty 3

## 2016-12-18 MED ORDER — FENTANYL CITRATE (PF) 100 MCG/2ML IJ SOLN
25.0000 ug | Freq: Once | INTRAMUSCULAR | Status: AC
  Administered 2016-12-18: 25 ug via INTRAVENOUS
  Filled 2016-12-18: qty 2

## 2016-12-18 MED ORDER — ALLOPURINOL 100 MG PO TABS
100.0000 mg | ORAL_TABLET | Freq: Every day | ORAL | Status: DC
  Administered 2016-12-21 – 2016-12-23 (×3): 100 mg via ORAL
  Filled 2016-12-18 (×3): qty 1

## 2016-12-18 MED ORDER — OXYCODONE-ACETAMINOPHEN 5-325 MG PO TABS
1.0000 | ORAL_TABLET | Freq: Once | ORAL | Status: AC
  Administered 2016-12-18: 1 via ORAL
  Filled 2016-12-18: qty 1

## 2016-12-18 MED ORDER — CHLORHEXIDINE GLUCONATE 4 % EX LIQD
60.0000 mL | Freq: Once | CUTANEOUS | Status: AC
  Administered 2016-12-19: 4 via TOPICAL
  Filled 2016-12-18: qty 60

## 2016-12-18 MED ORDER — POLYETHYLENE GLYCOL 3350 17 G PO PACK: 17.0000 g | PACK | Freq: Every day | ORAL | Status: DC | PRN

## 2016-12-18 MED ORDER — CITALOPRAM HYDROBROMIDE 40 MG PO TABS
40.0000 mg | ORAL_TABLET | Freq: Every day | ORAL | Status: DC
  Administered 2016-12-19 – 2016-12-23 (×4): 40 mg via ORAL
  Filled 2016-12-18: qty 1
  Filled 2016-12-18: qty 4
  Filled 2016-12-18 (×2): qty 1
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
## 2016-12-18 NOTE — ED Notes (Signed)
Admitting MD aware pt requesting pain relief.

## 2016-12-18 NOTE — ED Triage Notes (Signed)
Received pt via EMS with c/o trip and fall while at church causing pain to left knee. Pt denies + LOC or dizziness.

## 2016-12-18 NOTE — Progress Notes (Signed)
Orthopedic Tech Progress Note Patient Details:  Stacy Morris 25-Jul-1933 [id number]  Ortho Devices Type of Ortho Device: Knee Immobilizer Ortho Device/Splint Interventions: Application   [name] 12/18/2016, 2:39 PM

## 2016-12-18 NOTE — Progress Notes (Signed)
Consult acknowledged.  Patient has periprosthetic Left distal femur fracture.  Plan for surgical fixation tomorrow.  Nothing by mouth after midnight.  Hold chemical DVT prophylaxis.  Full consult note to follow.

## 2016-12-18 NOTE — Progress Notes (Signed)
Stacy Morris, [place] informed RN that patient will sign surgical consent when she goes to pre-op for surgery. Thanks.
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
## 2016-12-18 NOTE — Progress Notes (Signed)
Patient admitted to room 5N25 from Emergency room. She fell at church and received fracture. She is alert and oriented. Family at bedside. Stable vital signs surgery planned for tomorrow 12/19/16.

## 2016-12-19 ENCOUNTER — Encounter (HOSPITAL_COMMUNITY)
Admission: EM | Disposition: A | Discharge status: Rehabilitation Facility | Payer: Self-pay | Source: Home / Self Care | Attending: Internal Medicine

## 2016-12-19 LAB — BASIC METABOLIC PANEL
ANION GAP: 6 (ref 5–15)
BUN: 38 mg/dL — AB (ref 6–20)
CHLORIDE: 105 mmol/L (ref 101–111)
CO2: 25 mmol/L (ref 22–32)
Calcium: 9.5 mg/dL (ref 8.9–10.3)
Creatinine, Ser: 2.29 mg/dL — ABNORMAL HIGH (ref 0.44–1.00)
GFR calc Af Amer: 22 mL/min — ABNORMAL LOW (ref >60)
GFR, EST NON AFRICAN AMERICAN: 19 mL/min — AB (ref >60)
GLUCOSE: 92 mg/dL (ref 65–99)
POTASSIUM: 4.5 mmol/L (ref 3.5–5.1)
Sodium: 136 mmol/L (ref 135–145)

## 2016-12-19 LAB — CBC
HEMATOCRIT: 30.5 % — AB (ref 36.0–46.0)
HEMOGLOBIN: 9.6 g/dL — AB (ref 12.0–15.0)
MCH: 31.6 pg (ref 26.0–34.0)
MCHC: 31.5 g/dL (ref 30.0–36.0)
MCV: 100.3 fL — AB (ref 78.0–100.0)
Platelets: 190 10*3/uL (ref 150–400)
RBC: 3.04 MIL/uL — ABNORMAL LOW (ref 3.87–5.11)
RDW: 14.2 % (ref 11.5–15.5)
WBC: 9.4 10*3/uL (ref 4.0–10.5)

## 2016-12-19 LAB — MRSA PCR SCREENING: MRSA by PCR: NEGATIVE

## 2016-12-19 SURGERY — INSERTION, INTRAMEDULLARY ROD, FEMUR, RETROGRADE
Anesthesia: General | Laterality: Left

## 2016-12-19 MED ORDER — DEXTROSE-NACL 5-0.45 % IV SOLN
INTRAVENOUS | Status: DC
  Filled 2016-12-19 (×5): qty 1000
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
## 2016-12-19 NOTE — Progress Notes (Signed)
PROGRESS NOTE    Stacy Morris  TOI:[id number] DOB: 08/31/1933 DOA: 12/18/2016 PCP: Stephens Shire, MD   Brief Narrative: Stacy Morris is a 83 y.o. female with a history of bilateral total knee replacements, chronic kidney disease stage III, rheumatoid arthritis, hypertension, and hypothyroidism.  Patient presented after falling in church and was found to have a left distal femur fracture.  Orthopedic surgery was consulted and is planning surgery today.   Assessment & Plan:   Principal Problem:   Closed comminuted intra-articular fracture of distal end of left femur ([place]) Active Problems:   Rheumatoid arthritis ([place])   Essential hypertension   CKD (chronic kidney disease), stage III (HCC)   Hypothyroidism   Left distal femur fracture Pain is managed with analgesia. -Orthopedic surgery recommendations: Surgery today  Rheumatoid arthritis Stable.  Patient is on chronic daily prednisone and Enbrel once a week. -Continue prednisone  Hypothyroidism Last TSH of 26.  Patient follows outpatient.  No symptoms of hypothyroidism. -Continue Synthroid  Essential hypertension Mildly low diastolic pressures with slightly elevated systolic pressures -Continue continue losartan  CKD stage III Stable Avoid nephrotoxic medications  GERD Stable -Continue PPI  Depression Stable -Continue citalopram   DVT prophylaxis: SCDs Code Status: Full code Family Communication: Son at bedside Disposition Plan: Discharge pending orthopedic workup   Consultants:   Orthopedic surgery  Procedures:   None  Antimicrobials:  None   Subjective: Patient reports left leg pain.  No other concerns.  Objective: Vitals:   12/18/16 1633 12/18/16 1644 12/18/16 1932 12/19/16 0557  BP:   (!) 142/64 (!) 124/45  Pulse: (!) 56 (!) 56 (!) 54 (!) 58  Resp: 20 14 12    Temp:   98.7 F (37.1 C) 99 F (37.2 C)  TempSrc:   Oral Oral  SpO2: 93% 92% 92% 96%   No intake or output data in  the 24 hours ending 12/19/16 1056 There were no vitals filed for this visit.  Examination:  General exam: Appears calm and comfortable Respiratory system: Clear to auscultation. Respiratory effort normal. Cardiovascular system: S1 & S2 heard, RRR. No murmurs, rubs, gallops or clicks. Gastrointestinal system: Abdomen is nondistended, soft and nontender. Normal bowel sounds heard. Central nervous system: Alert and oriented. No focal neurological deficits. Extremities: No edema. No calf tenderness. Brace over left knee Skin: No cyanosis. No rashes Psychiatry: Judgement and insight appear normal. Mood & affect appropriate.     Data Reviewed: I have personally reviewed following labs and imaging studies  CBC: Recent Labs  Lab 12/18/16 1402 12/19/16 0541  WBC 8.9 9.4  NEUTROABS 6.2  --   HGB 10.2* 9.6*  HCT 31.9* 30.5*  MCV 98.8 100.3*  PLT 185 833   Basic Metabolic Panel: Recent Labs  Lab 12/18/16 1402  NA 137  K 5.0  CL 106  CO2 24  GLUCOSE 111*  BUN 40*  CREATININE 2.27*  CALCIUM 9.6   GFR: CrCl cannot be calculated (Unknown ideal weight.). Liver Function Tests: Recent Labs  Lab 12/18/16 1402  AST 19  ALT 16  ALKPHOS 71  BILITOT 1.1  PROT 6.8  ALBUMIN 3.5   No results for input(s): LIPASE, AMYLASE in the last 168 hours. No results for input(s): AMMONIA in the last 168 hours. Coagulation Profile: No results for input(s): INR, PROTIME in the last 168 hours. Cardiac Enzymes: No results for input(s): CKTOTAL, CKMB, CKMBINDEX, TROPONINI in the last 168 hours. BNP (last 3 results) No results for input(s): PROBNP in the last  8760 hours. HbA1C: No results for input(s): HGBA1C in the last 72 hours. CBG: No results for input(s): GLUCAP in the last 168 hours. Lipid Profile: No results for input(s): CHOL, HDL, LDLCALC, TRIG, CHOLHDL, LDLDIRECT in the last 72 hours. Thyroid Function Tests: No results for input(s): TSH, T4TOTAL, FREET4, T3FREE, THYROIDAB in the  last 72 hours. Anemia Panel: No results for input(s): VITAMINB12, FOLATE, FERRITIN, TIBC, IRON, RETICCTPCT in the last 72 hours. Sepsis Labs: No results for input(s): PROCALCITON, LATICACIDVEN in the last 168 hours.  Recent Results (from the past 240 hour(s))  MRSA PCR Screening     Status: None   Collection Time: 12/19/16  6:50 AM  Result Value Ref Range Status   MRSA by PCR NEGATIVE NEGATIVE Final    Comment:        The GeneXpert MRSA Assay (FDA approved for NASAL specimens only), is one component of a comprehensive MRSA colonization surveillance program. It is not intended to diagnose MRSA infection nor to guide or monitor treatment for MRSA infections.          Radiology Studies: Dg Chest 1 View  Result Date: 12/18/2016 CLINICAL DATA:  Fall EXAM: CHEST 1 VIEW COMPARISON:  04/09/2014 FINDINGS: Lungs are under aerated. Bibasilar atelectasis. Stable interstitial prominence. Mild cardiomegaly. No pneumothorax. IMPRESSION: Cardiomegaly without decompensation. Low volumes and bibasilar atelectasis. Electronically Signed   By: [name] M.D.   On: 12/18/2016 13:38   Ct Knee Left Wo Contrast  Result Date: 12/18/2016 CLINICAL DATA:  Fall, landing on the left knee, with distal femur fracture. EXAM: CT OF THE left KNEE WITHOUT CONTRAST TECHNIQUE: Multidetector CT imaging of the left knee was performed according to the standard protocol. Multiplanar CT image reconstructions were also generated. COMPARISON:  Radiographs from 12/18/2016 FINDINGS: Streak artifact from the total knee prosthesis obscures bony detail. Bones/Joint/Cartilage Periprosthetic fracture of the distal femur noted, with a oblique fracture of the distal femoral metadiaphysis noted extending to the loop of the anterior margin of the femoral component of the total knee prosthesis. The distal fragment is displaced 1.3 cm posteriorly, 2.4 cm laterally, and overlapped by 1.0 cm. In addition, there is some mild comminution  including a 2.0 by 1.3 cm cortical fragment imbedded along the distal fracture plane margin to the proximal fragment, a 1.6 by 0.7 cm fragment along the posterolateral margin of the dominant distal fracture fragment, and a slightly longitudinal nondisplaced fracture of the shaft of the femur extending proximally from the dominant fracture site medially, becoming indistinct about 6 cm proximal to the dominant oblique fracture site. I do not observe the patella, tibia, or fibula to be fractured in their visualized portions. Overall the articulation of the distal fragment with the tibia appears normal. Ligaments Suboptimally assessed by CT. Muscles and Tendons As expected, there is abnormal edema and some hematoma along the regional musculature and tissue planes. Detail obscured by streak artifact. There is some thickening of the distal quadriceps tendon which could be chronic. Soft tissues Mild subcutaneous edema as expected. IMPRESSION: 1. Displaced and mildly overlapped periprosthetic fracture of the distal femur. Mild comminution as detailed above. No fracture of the patella, proximal tibia, or proximal fibula identified. The distal femur fracture fragment and the tibia appear to retain expected alignment. 2. There is some thickening of the distal quadriceps tendon, uncertain chronicity. Electronically Signed   By: Van Clines M.D.   On: 12/18/2016 15:30   Dg Knee Complete 4 Views Left  Result Date: 12/18/2016 CLINICAL DATA:  Tripped  and fall at church. Knee pain. Initial encounter. EXAM: LEFT KNEE - COMPLETE 4+ VIEW COMPARISON:  None. FINDINGS: A comminuted fracture of the distal fibula is present just above the femoral component of the total knee arthroplasty. The patella is intact. Tibia is intact. Mild osteopenia is noted. Greater than 40 degrees angulation is present. Fracture is displaced posteriorly and laterally. IMPRESSION: 1. Comminuted fracture of the distal femur with severe ventral  angulation. The fracture is just above the femoral component of the total knee arthroplasty. Electronically Signed   By: San Morelle M.D.   On: 12/18/2016 13:45   Dg Femur Min 2 Views Left  Result Date: 12/18/2016 CLINICAL DATA:  Fall at church.  Left knee pain.  Initial encounter. EXAM: LEFT FEMUR 2 VIEWS COMPARISON:  Knee radiographs of the same day. FINDINGS: A comminuted fracture of the distal femur air is present just above the femoral component of the total knee arthroplasty. Fracture is displaced posteriorly and laterally with greater than 40 degrees ventral angulation. IMPRESSION: Comminuted distal femur fracture just above the femoral component of the total knee arthroplasty with posterolateral displacement and severe angulation. Electronically Signed   By: San Morelle M.D.   On: 12/18/2016 13:46        Scheduled Meds: . allopurinol  100 mg Oral Daily  . aspirin  81 mg Oral Daily  . calcium-vitamin D  1 tablet Oral Q breakfast  . citalopram  40 mg Oral Daily  . levothyroxine  50 mcg Oral QAC breakfast  . losartan  100 mg Oral Daily  . pantoprazole  40 mg Oral Daily  . povidone-iodine  2 application Topical Once  . predniSONE  5 mg Oral Q breakfast  . senna  1 tablet Oral BID   Continuous Infusions: .  ceFAZolin (ANCEF) IV    . dextrose 5 % and 0.45% NaCl 1,000 mL infusion       LOS: 1 day     Cordelia Poche, MD [facility] 12/19/2016, 10:56 AM Pager: [phone]  If 7PM-7AM, please contact night-coverage [email or web address] Password [email or web address] 12/19/2016, 10:56 AM

## 2016-12-20 ENCOUNTER — Inpatient Hospital Stay (HOSPITAL_COMMUNITY): Payer: Medicare Other | Admitting: Anesthesiology

## 2016-12-20 ENCOUNTER — Encounter (HOSPITAL_COMMUNITY): Payer: Self-pay | Admitting: Anesthesiology

## 2016-12-20 ENCOUNTER — Inpatient Hospital Stay (HOSPITAL_COMMUNITY): Payer: Medicare Other

## 2016-12-20 ENCOUNTER — Encounter (HOSPITAL_COMMUNITY)
Admission: EM | Disposition: A | Discharge status: Rehabilitation Facility | Payer: Self-pay | Source: Home / Self Care | Attending: Internal Medicine

## 2016-12-20 HISTORY — PX: ORIF FEMUR FRACTURE: SHX2119

## 2016-12-20 LAB — CBC
HEMATOCRIT: 29.2 % — AB (ref 36.0–46.0)
HEMOGLOBIN: 9.3 g/dL — AB (ref 12.0–15.0)
MCH: 31.8 pg (ref 26.0–34.0)
MCHC: 31.8 g/dL (ref 30.0–36.0)
MCV: 100 fL (ref 78.0–100.0)
Platelets: 172 10*3/uL (ref 150–400)
RBC: 2.92 MIL/uL — AB (ref 3.87–5.11)
RDW: 13.9 % (ref 11.5–15.5)
WBC: 11.5 10*3/uL — ABNORMAL HIGH (ref 4.0–10.5)

## 2016-12-20 LAB — SURGICAL PCR SCREEN
MRSA, PCR: NEGATIVE
STAPHYLOCOCCUS AUREUS: NEGATIVE

## 2016-12-20 LAB — CREATININE, SERUM
Creatinine, Ser: 1.95 mg/dL — ABNORMAL HIGH (ref 0.44–1.00)
GFR calc non Af Amer: 23 mL/min — ABNORMAL LOW (ref >60)
GFR, EST AFRICAN AMERICAN: 27 mL/min — AB (ref >60)

## 2016-12-20 SURGERY — OPEN REDUCTION INTERNAL FIXATION (ORIF) DISTAL FEMUR FRACTURE
Anesthesia: Spinal | Site: Knee | Laterality: Left

## 2016-12-20 MED ORDER — LIDOCAINE 2% (20 MG/ML) 5 ML SYRINGE
INTRAMUSCULAR | Status: AC
  Filled 2016-12-20: qty 5

## 2016-12-20 MED ORDER — ONDANSETRON HCL 4 MG/2ML IJ SOLN
INTRAMUSCULAR | Status: DC | PRN
  Administered 2016-12-20: 4 mg via INTRAVENOUS

## 2016-12-20 MED ORDER — PROPOFOL 500 MG/50ML IV EMUL
INTRAVENOUS | Status: DC | PRN
  Administered 2016-12-20: 25 ug/kg/min via INTRAVENOUS

## 2016-12-20 MED ORDER — FENTANYL CITRATE (PF) 100 MCG/2ML IJ SOLN: 25.0000 ug | INTRAMUSCULAR | Status: DC | PRN

## 2016-12-20 MED ORDER — PROPOFOL 10 MG/ML IV BOLUS
INTRAVENOUS | Status: AC
  Filled 2016-12-20: qty 20

## 2016-12-20 MED ORDER — MIDAZOLAM HCL 2 MG/2ML IJ SOLN
INTRAMUSCULAR | Status: AC
  Filled 2016-12-20: qty 2

## 2016-12-20 MED ORDER — ENOXAPARIN SODIUM 40 MG/0.4ML ~~LOC~~ SOLN
40.0000 mg | SUBCUTANEOUS | Status: DC
  Administered 2016-12-21 – 2016-12-23 (×3): 40 mg via SUBCUTANEOUS
  Filled 2016-12-20 (×3): qty 0.4

## 2016-12-20 MED ORDER — DEXAMETHASONE SODIUM PHOSPHATE 10 MG/ML IJ SOLN
INTRAMUSCULAR | Status: DC | PRN
  Administered 2016-12-20: 10 mg via INTRAVENOUS

## 2016-12-20 MED ORDER — CEFAZOLIN SODIUM-DEXTROSE 1-4 GM/50ML-% IV SOLN
1.0000 g | Freq: Four times a day (QID) | INTRAVENOUS | Status: AC
  Administered 2016-12-20 – 2016-12-21 (×3): 1 g via INTRAVENOUS
  Filled 2016-12-20 (×3): qty 50

## 2016-12-20 MED ORDER — METOCLOPRAMIDE HCL 5 MG PO TABS: 5.0000 mg | ORAL_TABLET | Freq: Three times a day (TID) | ORAL | Status: DC | PRN

## 2016-12-20 MED ORDER — FENTANYL CITRATE (PF) 100 MCG/2ML IJ SOLN
INTRAMUSCULAR | Status: DC | PRN
  Administered 2016-12-20 (×2): 50 ug via INTRAVENOUS
  Administered 2016-12-20 (×4): 25 ug via INTRAVENOUS

## 2016-12-20 MED ORDER — LIDOCAINE 2% (20 MG/ML) 5 ML SYRINGE
INTRAMUSCULAR | Status: DC | PRN
  Administered 2016-12-20: 60 mg via INTRAVENOUS

## 2016-12-20 MED ORDER — ONDANSETRON HCL 4 MG/2ML IJ SOLN: 4.0000 mg | Freq: Four times a day (QID) | INTRAMUSCULAR | Status: DC | PRN

## 2016-12-20 MED ORDER — SODIUM CHLORIDE 0.9 % IV BOLUS (SEPSIS)
500.0000 mL | Freq: Once | INTRAVENOUS | Status: AC
  Administered 2016-12-20: 500 mL via INTRAVENOUS

## 2016-12-20 MED ORDER — PROPOFOL 10 MG/ML IV BOLUS
INTRAVENOUS | Status: DC | PRN
  Administered 2016-12-20: 30 mg via INTRAVENOUS

## 2016-12-20 MED ORDER — FENTANYL CITRATE (PF) 250 MCG/5ML IJ SOLN
INTRAMUSCULAR | Status: AC
  Filled 2016-12-20: qty 5

## 2016-12-20 MED ORDER — METOCLOPRAMIDE HCL 5 MG/ML IJ SOLN: 5.0000 mg | Freq: Three times a day (TID) | INTRAMUSCULAR | Status: DC | PRN

## 2016-12-20 MED ORDER — VANCOMYCIN HCL 1000 MG IV SOLR
INTRAVENOUS | Status: AC
  Filled 2016-12-20: qty 1000

## 2016-12-20 MED ORDER — MORPHINE SULFATE (PF) 4 MG/ML IV SOLN: 1.0000 mg | INTRAVENOUS | Status: DC | PRN

## 2016-12-20 MED ORDER — PROPOFOL 1000 MG/100ML IV EMUL
INTRAVENOUS | Status: AC
  Filled 2016-12-20: qty 100

## 2016-12-20 MED ORDER — LACTATED RINGERS IV SOLN
INTRAVENOUS | Status: DC | PRN
  Administered 2016-12-20: 16:00:00 via INTRAVENOUS

## 2016-12-20 MED ORDER — VANCOMYCIN HCL 1000 MG IV SOLR
INTRAVENOUS | Status: DC | PRN
Start: 2016-12-20 — End: 2016-12-20
  Administered 2016-12-20: 1000 mg via TOPICAL

## 2016-12-20 MED ORDER — ONDANSETRON HCL 4 MG PO TABS: 4.0000 mg | ORAL_TABLET | Freq: Four times a day (QID) | ORAL | Status: DC | PRN

## 2016-12-20 MED ORDER — CEFAZOLIN SODIUM-DEXTROSE 2-4 GM/100ML-% IV SOLN
INTRAVENOUS | Status: AC
  Filled 2016-12-20: qty 100

## 2016-12-20 MED ORDER — 0.9 % SODIUM CHLORIDE (POUR BTL) OPTIME
TOPICAL | Status: DC | PRN
  Administered 2016-12-20: 1000 mL

## 2016-12-20 MED ORDER — POTASSIUM CHLORIDE IN NACL 20-0.9 MEQ/L-% IV SOLN
INTRAVENOUS | Status: DC
  Administered 2016-12-20: 20:00:00 via INTRAVENOUS
  Filled 2016-12-20: qty 1000

## 2016-12-20 MED ORDER — BISACODYL 5 MG PO TBEC: 5.0000 mg | DELAYED_RELEASE_TABLET | Freq: Every day | ORAL | Status: DC | PRN

## 2016-12-20 MED ORDER — ACETAMINOPHEN 500 MG PO TABS
1000.0000 mg | ORAL_TABLET | Freq: Four times a day (QID) | ORAL | Status: AC
  Administered 2016-12-20 – 2016-12-21 (×4): 1000 mg via ORAL
  Filled 2016-12-20 (×4): qty 2

## 2016-12-20 MED ORDER — SODIUM CHLORIDE 0.9 % IV SOLN
INTRAVENOUS | Status: DC
  Administered 2016-12-20 (×3): via INTRAVENOUS

## 2016-12-20 MED ORDER — CEFAZOLIN SODIUM-DEXTROSE 2-4 GM/100ML-% IV SOLN
2.0000 g | INTRAVENOUS | Status: AC
  Administered 2016-12-20: 2 g via INTRAVENOUS

## 2016-12-20 MED ORDER — ONDANSETRON HCL 4 MG/2ML IJ SOLN: 4.0000 mg | Freq: Once | INTRAMUSCULAR | Status: DC | PRN

## 2016-12-20 MED ORDER — DOCUSATE SODIUM 100 MG PO CAPS
100.0000 mg | ORAL_CAPSULE | Freq: Two times a day (BID) | ORAL | Status: DC
  Administered 2016-12-20 – 2016-12-23 (×5): 100 mg via ORAL
  Filled 2016-12-20 (×5): qty 1

## 2016-12-20 MED ORDER — POLYETHYLENE GLYCOL 3350 17 G PO PACK
17.0000 g | PACK | Freq: Every day | ORAL | Status: DC
  Administered 2016-12-22 – 2016-12-23 (×2): 17 g via ORAL
  Filled 2016-12-20 (×3): qty 1

## 2016-12-20 MED ORDER — EPHEDRINE SULFATE 50 MG/ML IJ SOLN
INTRAMUSCULAR | Status: DC | PRN
  Administered 2016-12-20 (×2): 10 mg via INTRAVENOUS
  Administered 2016-12-20: 5 mg via INTRAVENOUS

## 2016-12-20 MED ORDER — EPHEDRINE 5 MG/ML INJ
INTRAVENOUS | Status: AC
  Filled 2016-12-20: qty 10

## 2016-12-20 SURGICAL SUPPLY — 75 items
BANDAGE ACE 4X5 VEL STRL LF (GAUZE/BANDAGES/DRESSINGS) ×3 IMPLANT
BANDAGE ACE 6X5 VEL STRL LF (GAUZE/BANDAGES/DRESSINGS) ×3 IMPLANT
BIT DRILL LONG 3.3 (BIT) ×2 IMPLANT
BIT DRILL LONG 3.3MM (BIT) ×1
BIT DRILL QC 3.3X195 (BIT) ×3 IMPLANT
BLADE CLIPPER SURG (BLADE) IMPLANT
BNDG GAUZE ELAST 4 BULKY (GAUZE/BANDAGES/DRESSINGS) ×3 IMPLANT
BRUSH SCRUB SURG 4.25 DISP (MISCELLANEOUS) ×6 IMPLANT
CANISTER SUCT 3000ML PPV (MISCELLANEOUS) ×3 IMPLANT
CAP NCB LOCKING (Cap) ×27 IMPLANT
CHLORAPREP W/TINT 26ML (MISCELLANEOUS) ×3 IMPLANT
COVER SURGICAL LIGHT HANDLE (MISCELLANEOUS) ×3 IMPLANT
DERMABOND ADVANCED (GAUZE/BANDAGES/DRESSINGS) ×4
DERMABOND ADVANCED .7 DNX12 (GAUZE/BANDAGES/DRESSINGS) ×2 IMPLANT
DRAPE C-ARM 42X72 X-RAY (DRAPES) ×3 IMPLANT
DRAPE C-ARMOR (DRAPES) ×3 IMPLANT
DRAPE IMP U-DRAPE 54X76 (DRAPES) ×3 IMPLANT
DRAPE ORTHO SPLIT 77X108 STRL (DRAPES) ×4
DRAPE SURG 17X23 STRL (DRAPES) ×3 IMPLANT
DRAPE SURG ORHT 6 SPLT 77X108 (DRAPES) ×2 IMPLANT
DRAPE U-SHAPE 47X51 STRL (DRAPES) ×3 IMPLANT
DRSG ADAPTIC 3X8 NADH LF (GAUZE/BANDAGES/DRESSINGS) ×3 IMPLANT
DRSG MEPILEX BORDER 4X12 (GAUZE/BANDAGES/DRESSINGS) ×3 IMPLANT
DRSG MEPILEX BORDER 4X4 (GAUZE/BANDAGES/DRESSINGS) ×3 IMPLANT
DRSG MEPILEX BORDER 4X8 (GAUZE/BANDAGES/DRESSINGS) ×3 IMPLANT
DRSG PAD ABDOMINAL 8X10 ST (GAUZE/BANDAGES/DRESSINGS) ×12 IMPLANT
ELECT REM PT RETURN 9FT ADLT (ELECTROSURGICAL) ×3
ELECTRODE REM PT RTRN 9FT ADLT (ELECTROSURGICAL) ×1 IMPLANT
EVACUATOR 1/8 PVC DRAIN (DRAIN) IMPLANT
EVACUATOR 3/16  PVC DRAIN (DRAIN)
EVACUATOR 3/16 PVC DRAIN (DRAIN) IMPLANT
GAUZE SPONGE 4X4 12PLY STRL (GAUZE/BANDAGES/DRESSINGS) ×3 IMPLANT
GLOVE BIO SURGEON STRL SZ7.5 (GLOVE) ×12 IMPLANT
GLOVE BIOGEL PI IND STRL 7.5 (GLOVE) ×1 IMPLANT
GLOVE BIOGEL PI INDICATOR 7.5 (GLOVE) ×2
GOWN STRL REUS W/ TWL LRG LVL3 (GOWN DISPOSABLE) ×2 IMPLANT
GOWN STRL REUS W/TWL LRG LVL3 (GOWN DISPOSABLE) ×4
K-WIRE 2.0 (WIRE) ×4
K-WIRE 2.4X300 SHARP (WIRE) ×6
K-WIRE FXSTD 280X2XNS SS (WIRE) ×2
KIT BASIN OR (CUSTOM PROCEDURE TRAY) ×3 IMPLANT
KIT ROOM TURNOVER OR (KITS) ×3 IMPLANT
KWIRE 2.4X300 SHARP (WIRE) ×2 IMPLANT
KWIRE FXSTD 280X2XNS SS (WIRE) ×2 IMPLANT
NEEDLE 22X1 1/2 (OR ONLY) (NEEDLE) IMPLANT
NS IRRIG 1000ML POUR BTL (IV SOLUTION) ×3 IMPLANT
PACK GENERAL/GYN (CUSTOM PROCEDURE TRAY) ×3 IMPLANT
PAD ARMBOARD 7.5X6 YLW CONV (MISCELLANEOUS) ×6 IMPLANT
PAD CAST 4YDX4 CTTN HI CHSV (CAST SUPPLIES) ×1 IMPLANT
PADDING CAST COTTON 4X4 STRL (CAST SUPPLIES) ×2
PADDING CAST COTTON 6X4 STRL (CAST SUPPLIES) ×3 IMPLANT
PLATE DIST FEM 12H (Plate) ×3 IMPLANT
SCREW 5.0 70MM (Screw) ×6 IMPLANT
SCREW 5.0 80MM (Screw) ×3 IMPLANT
SCREW CORTICAL NCB 5.0X65 (Screw) ×3 IMPLANT
SCREW NCB 3.5X75X5X6.2XST (Screw) ×1 IMPLANT
SCREW NCB 4.0MX38M (Screw) ×3 IMPLANT
SCREW NCB 5.0X36MM (Screw) ×6 IMPLANT
SCREW NCB 5.0X38 (Screw) ×6 IMPLANT
SCREW NCB 5.0X75MM (Screw) ×2 IMPLANT
SCREW NCB 5.0X85MM (Screw) ×3 IMPLANT
SPONGE LAP 18X18 X RAY DECT (DISPOSABLE) ×3 IMPLANT
STAPLER VISISTAT 35W (STAPLE) ×3 IMPLANT
SUCTION FRAZIER HANDLE 10FR (MISCELLANEOUS) ×2
SUCTION TUBE FRAZIER 10FR DISP (MISCELLANEOUS) ×1 IMPLANT
SUT MNCRL AB 3-0 PS2 18 (SUTURE) ×3 IMPLANT
SUT VIC AB 0 CT1 27 (SUTURE) ×4
SUT VIC AB 0 CT1 27XBRD ANBCTR (SUTURE) ×2 IMPLANT
SUT VIC AB 1 CT1 27 (SUTURE) ×4
SUT VIC AB 1 CT1 27XBRD ANBCTR (SUTURE) ×2 IMPLANT
SUT VIC AB 2-0 CT1 27 (SUTURE) ×4
SUT VIC AB 2-0 CT1 TAPERPNT 27 (SUTURE) ×2 IMPLANT
TOWEL OR 17X26 10 PK STRL BLUE (TOWEL DISPOSABLE) ×6 IMPLANT
TRAY FOLEY W/METER SILVER 16FR (SET/KITS/TRAYS/PACK) IMPLANT
WATER STERILE IRR 1000ML POUR (IV SOLUTION) IMPLANT

## 2016-12-20 NOTE — OR Nursing (Signed)
1800: post-op in&out cath=300cc cyu, per protocol, no trauma.

## 2016-12-20 NOTE — Progress Notes (Signed)
Betadine swab complete to nares.

## 2016-12-20 NOTE — Transfer of Care (Signed)
Immediate Anesthesia Transfer of Care Note  Patient: Stacy Morris  Procedure(s) Performed: OPEN REDUCTION INTERNAL FIXATION (ORIF) DISTAL FEMUR FRACTURE (Left Knee)  Patient Location: PACU  Anesthesia Type:MAC and Spinal  Level of Consciousness: awake and alert   Airway & Oxygen Therapy: Patient Spontanous Breathing and Patient connected to face mask oxygen  Post-op Assessment: Report given to RN and Post -op Vital signs reviewed and stable  Post vital signs: Reviewed and stable  Last Vitals:  Vitals:   12/20/16 0611 12/20/16 1822  BP: (!) 148/54   Pulse: 62   Resp: 16   Temp: 36.6 C (!) (P) 36.4 C  SpO2: 96%     Last Pain:  Vitals:   12/20/16 1822  TempSrc:   PainSc: (P) 0-No pain      Patients Stated Pain Goal: 2 (40/98/11 9147)  Complications: No apparent anesthesia complications

## 2016-12-20 NOTE — Anesthesia Procedure Notes (Signed)
Spinal  Start time: 12/20/2016 3:50 PM End time: 12/20/2016 3:55 PM Staffing Anesthesiologist: Roberts Gaudy, MD Preanesthetic Checklist Completed: patient identified, site marked, surgical consent, pre-op evaluation, timeout performed, IV checked, risks and benefits discussed and monitors and equipment checked Spinal Block Patient position: left lateral decubitus Prep: ChloraPrep Patient monitoring: cardiac monitor, continuous pulse ox and blood pressure Approach: right paramedian Location: L3-4 Injection technique: single-shot Needle Needle type: Tuohy  Needle gauge: 22 G Needle length: 9 cm Assessment Sensory level: T6 Additional Notes 14 mg 0.75% Bupivacaine injected easily

## 2016-12-20 NOTE — Interval H&P Note (Signed)
History and Physical Interval Note:  12/20/2016 2:39 PM  Stacy Morris  has presented today for surgery, with the diagnosis of LEFT FEMUR FRACTURE  The various methods of treatment have been discussed with the patient and family. After consideration of risks, benefits and other options for treatment, the patient has consented to  Procedure(s): OPEN REDUCTION INTERNAL FIXATION (ORIF) DISTAL FEMUR FRACTURE (Left) as a surgical intervention .  The patient's history has been reviewed, patient examined, no change in status, stable for surgery.  I have reviewed the patient's chart and labs.  Questions were answered to the patient's satisfaction.     Llewellyn Schoenberger, Thomasene Lot

## 2016-12-20 NOTE — Op Note (Signed)
OrthopaedicSurgeryOperativeNote (WUJ:[id number]) Date of Surgery: 12/20/2016  Admit Date: 12/18/2016   Diagnoses: Pre-Op Diagnoses: Left periprosthetic distal femur fracture  Post-Op Diagnosis: Same  Procedures: CPT 27511-ORIF of left supracondylar distal femur fracture  Surgeons: Primary: Shona Needles, MD   Location:[facility] OR ROOM 06   AnesthesiaSpinal   Antibiotics:Ancef 2g preop   Tourniquettime:None  NFAOZHYQMVHQIONGEX:528 mL   Complications:None  Specimens:None  Implants: Implant Name Type Inv. Item Serial No. Manufacturer Lot No. LRB No. Used Action  PLATE DIST FEM 41L - [id number] Plate PLATE DIST FEM 53G  [employer]  Left 1 Implanted  SCREW NCB 5.0X36MM - [id number] Screw SCREW NCB 5.0X36MM  [employer]  Left 2 Implanted  SCREW NCB 5.0X38 - [id number] Screw SCREW NCB 5.0X38  [employer]  Left 2 Implanted  SCREW 5.0 70MM - [id number] Screw SCREW 5.0 70MM  [employer]  Left 2 Implanted  SCREW NCB 5.0X75MM - [id number] Screw SCREW NCB 5.0X75MM  [employer]  Left 1 Implanted  SCREW 5.0 80MM - [id number] Screw SCREW 5.0 80MM  [employer]  Left 1 Implanted  SCREW NCB 5.0X85MM - [id number] Screw SCREW NCB 5.0X85MM  [employer]  Left 1 Implanted  SCREW CORTICAL NCB 5.0X65 - [id number] Screw SCREW CORTICAL NCB 5.0X65  [employer]  Left 1 Implanted  CAP NCB LOCKING - [id number] Cap CAP NCB LOCKING  [employer]  Left 9 Implanted    IndicationsforSurgery: Stacy Morris is an 83 year old female who presents to the emergency room after a ground-level fall while she was at church.  She was found to have a total knee replacement and a periprosthetic distal femur fracture.  It was felt that it could be fixed as there was a good amount of bone stock in the distal fragment.  I was asked by Dr. Lyla Glassing to take over her care and perform her surgery.  In this active lady I did not feel that nonoperative treatment is an option.  Risk for  surgery were discussed.  Risks discussed included bleeding requiring blood transfusion, bleeding causing a hematoma, infection, malunion, nonunion, damage to surrounding nerves and blood vessels, pain, hardware prominence or irritation, hardware failure, stiffness, post-traumatic arthritis, DVT/PE, compartment syndrome, and even death. Risks and benefits were extensively discussed as noted above and the patient and their family agreed to proceed with surgery and consent was obtained.  Operative Findings: Open reduction internal fixation of left periprosthetic distal femur fracture performed with [employer] 12-hole NCB plate  Procedure: The patient was identified in the preoperative holding area. Consent was confirmed with the patient and their family and all questions were answered. The operative extremity was marked after confirmation with the patient. The patientwas then brought back to the operating room by our anesthesia colleagues. The patient was placed under general anesthesia and was carefully transferred over to a radiolucent flattop table. They were secured to the bed and all bony prominences were padded.  A bump was placed under the operative hip. The operative extremity was then prepped and draped in usual sterile fashion. A timeout was performed to verify the patient, the procedure and extremity. Preoperative antibiotics were dosed.  Fluoroscopy was used to identify the fracture. A lateral approach was made to the distal femur. It was taken down through the skin and the IT band was incised in line with the incision. The distal portion of the vastus lateralis was reflected off the IT band and the distal articular block of the lateral femoral condyle was exposed. Subperiosteal dissection was carried  to the edges of the articular surface of the knee prosthesis. The fracture was was visualized and palpated. A reduction maneuver was performed to align the fracture in the AP and lateral planes.  This was done with a bone hook and a 399 clamp. Once the fracture was reduced, I provisionally held it in place with two 2.65mm K-wires in crossing fashion from the medial and lateral condyles.  I used fluoroscopy to identify the correct length plate to bridge the whole femoral shaft and proximal to the tip of the hip prothesis. I chose a 12-hole plate. The aiming arm was attached to the plate and slide submuscularly under the vastus to the proximal femur. A K-wire was used to appropriately align the distal portion of the plate. A drill sleeve was used to provisionally fix the proximal portion of the plate with a unicortical K-wire. A perfect lateral was obtained to show appropriate positioning of the plate.  The distal segment was drilled and 5.5mm cortical screws were placed in the distal fracture segment. A total of 3 screws were placed. Bicortical 5.24mm cortical screws were placed in the femoral shaft to walk the plate down to bone. These were all placed through the targeting guide with percutaneous incisions. Three more screws were placed in the distal segment. Locking caps were placed in 3 of the 4 shaft screws, leaving the proximal screw as a nonlocking screw to diminished the stress riser. The targeting guide was removed. Locking caps were placed on all of the distal screws. Fluoroscopy was used to confirm adequate reduction of the fracture and appropriate position of the plate and screws.  The incisions were irrigated with normal saline. A gram of vancomycin powder was placed in the incision around the plate. The IT band was closed with 0-vicryl. The skin was closed with 2-vicryl, 3-0 monocryl and steristrips. The incisions were dressed with Mepilex dressings and the leg was wrapped with ACE wraps. The patient was then transferred to the regular floor bed and taken to the PACU in stable condition.  Post Op Plan/Instructions: The patient will be touchdown weightbearing to the left lower extremity.   She received postoperative Ancef.  She received Lovenox for DVT prophylaxis.  She will likely need skilled nursing facility placement following her hospitalization.  I was present and performed the entire surgery.  Katha Hamming, MD [facility]

## 2016-12-20 NOTE — Anesthesia Procedure Notes (Signed)
Procedure Name: MAC Date/Time: 12/20/2016 3:55 PM Performed by: Lieutenant Diego, CRNA Pre-anesthesia Checklist: Patient identified, Emergency Drugs available, Suction available, Patient being monitored and Timeout performed Patient Re-evaluated:Patient Re-evaluated prior to induction Oxygen Delivery Method: Simple face mask Preoxygenation: Pre-oxygenation with 100% oxygen Induction Type: IV induction

## 2016-12-20 NOTE — Progress Notes (Signed)
PROGRESS NOTE    Stacy Morris  HER:[id number] DOB: November 09, 1933 DOA: 12/18/2016 PCP: Stephens Shire, MD   Brief Narrative: Stacy Morris is a 83 y.o. female with a history of bilateral total knee replacements, chronic kidney disease stage III, rheumatoid arthritis, hypertension, and hypothyroidism.  Patient presented after falling in church and was found to have a left distal femur fracture.  Orthopedic surgery was consulted and is planning surgery today.   Assessment & Plan:   Principal Problem:   Closed comminuted intra-articular fracture of distal end of left femur ([place]) Active Problems:   Rheumatoid arthritis ([place])   Essential hypertension   CKD (chronic kidney disease), stage III ([facility])   Hypothyroidism   Left distal femur fracture Pain is managed with analgesia. -Orthopedic surgery recommendations: Surgery today  Rheumatoid arthritis Stable.  Patient is on chronic daily prednisone and Enbrel once a week. -Continue prednisone  Hypothyroidism Last TSH of 12 in 2016.  Patient follows outpatient.  No symptoms of hypothyroidism. -Continue Synthroid  Essential hypertension Controlled with low diastolic pressure -hold losartan secondary to kidney disease   CKD stage III Stable although, baseline may be lower -discontinue losartan -Avoid nephrotoxic medications  GERD Stable -Continue PPI  Depression Stable -Continue citalopram   DVT prophylaxis: SCDs Code Status: Full code Family Communication: Son at bedside Disposition Plan: Discharge pending orthopedic workup   Consultants:   Orthopedic surgery  Procedures:   None  Antimicrobials:  None   Subjective: Leg pain manageable.  Objective: Vitals:   12/19/16 0557 12/19/16 2133 12/20/16 0033 12/20/16 0611  BP: (!) 124/45 (!) 131/50 (!) 135/51 (!) 148/54  Pulse: (!) 58 62 63 62  Resp:  12 12 16   Temp: 99 F (37.2 C) 98.1 F (36.7 C) 97.9 F (36.6 C) 97.9 F (36.6 C)  TempSrc: Oral Oral  Oral Oral  SpO2: 96% 90% 95% 96%    Intake/Output Summary (Last 24 hours) at 12/20/2016 1013 Last data filed at 12/20/2016 0943 Gross per 24 hour  Intake 0 ml  Output 1500 ml  Net -1500 ml   There were no vitals filed for this visit.  Examination:  General exam: Appears calm and comfortable Respiratory system: Clear to auscultation. Respiratory effort normal. Cardiovascular system: S1 & S2 heard, RRR. No murmurs. Gastrointestinal system: Abdomen is nondistended, soft and nontender. Normal bowel sounds heard. Central nervous system: Alert and oriented. No focal neurological deficits. Extremities: No edema. No calf tenderness. Brace over left knee Skin: No cyanosis. No rashes Psychiatry: Judgement and insight appear normal. Mood & affect appropriate.     Data Reviewed: I have personally reviewed following labs and imaging studies  CBC: Recent Labs  Lab 12/18/16 1402 12/19/16 0541  WBC 8.9 9.4  NEUTROABS 6.2  --   HGB 10.2* 9.6*  HCT 31.9* 30.5*  MCV 98.8 100.3*  PLT 185 856   Basic Metabolic Panel: Recent Labs  Lab 12/18/16 1402 12/19/16 0541  NA 137 136  K 5.0 4.5  CL 106 105  CO2 24 25  GLUCOSE 111* 92  BUN 40* 38*  CREATININE 2.27* 2.29*  CALCIUM 9.6 9.5   GFR: CrCl cannot be calculated (Unknown ideal weight.). Liver Function Tests: Recent Labs  Lab 12/18/16 1402  AST 19  ALT 16  ALKPHOS 71  BILITOT 1.1  PROT 6.8  ALBUMIN 3.5   No results for input(s): LIPASE, AMYLASE in the last 168 hours. No results for input(s): AMMONIA in the last 168 hours. Coagulation Profile: No results for  input(s): INR, PROTIME in the last 168 hours. Cardiac Enzymes: No results for input(s): CKTOTAL, CKMB, CKMBINDEX, TROPONINI in the last 168 hours. BNP (last 3 results) No results for input(s): PROBNP in the last 8760 hours. HbA1C: No results for input(s): HGBA1C in the last 72 hours. CBG: No results for input(s): GLUCAP in the last 168 hours. Lipid Profile: No  results for input(s): CHOL, HDL, LDLCALC, TRIG, CHOLHDL, LDLDIRECT in the last 72 hours. Thyroid Function Tests: No results for input(s): TSH, T4TOTAL, FREET4, T3FREE, THYROIDAB in the last 72 hours. Anemia Panel: No results for input(s): VITAMINB12, FOLATE, FERRITIN, TIBC, IRON, RETICCTPCT in the last 72 hours. Sepsis Labs: No results for input(s): PROCALCITON, LATICACIDVEN in the last 168 hours.  Recent Results (from the past 240 hour(s))  MRSA PCR Screening     Status: None   Collection Time: 12/19/16  6:50 AM  Result Value Ref Range Status   MRSA by PCR NEGATIVE NEGATIVE Final    Comment:        The GeneXpert MRSA Assay (FDA approved for NASAL specimens only), is one component of a comprehensive MRSA colonization surveillance program. It is not intended to diagnose MRSA infection nor to guide or monitor treatment for MRSA infections.          Radiology Studies: Dg Chest 1 View  Result Date: 12/18/2016 CLINICAL DATA:  Fall EXAM: CHEST 1 VIEW COMPARISON:  04/09/2014 FINDINGS: Lungs are under aerated. Bibasilar atelectasis. Stable interstitial prominence. Mild cardiomegaly. No pneumothorax. IMPRESSION: Cardiomegaly without decompensation. Low volumes and bibasilar atelectasis. Electronically Signed   By: [name] M.D.   On: 12/18/2016 13:38   Ct Knee Left Wo Contrast  Result Date: 12/18/2016 CLINICAL DATA:  Fall, landing on the left knee, with distal femur fracture. EXAM: CT OF THE left KNEE WITHOUT CONTRAST TECHNIQUE: Multidetector CT imaging of the left knee was performed according to the standard protocol. Multiplanar CT image reconstructions were also generated. COMPARISON:  Radiographs from 12/18/2016 FINDINGS: Streak artifact from the total knee prosthesis obscures bony detail. Bones/Joint/Cartilage Periprosthetic fracture of the distal femur noted, with a oblique fracture of the distal femoral metadiaphysis noted extending to the loop of the anterior margin of the  femoral component of the total knee prosthesis. The distal fragment is displaced 1.3 cm posteriorly, 2.4 cm laterally, and overlapped by 1.0 cm. In addition, there is some mild comminution including a 2.0 by 1.3 cm cortical fragment imbedded along the distal fracture plane margin to the proximal fragment, a 1.6 by 0.7 cm fragment along the posterolateral margin of the dominant distal fracture fragment, and a slightly longitudinal nondisplaced fracture of the shaft of the femur extending proximally from the dominant fracture site medially, becoming indistinct about 6 cm proximal to the dominant oblique fracture site. I do not observe the patella, tibia, or fibula to be fractured in their visualized portions. Overall the articulation of the distal fragment with the tibia appears normal. Ligaments Suboptimally assessed by CT. Muscles and Tendons As expected, there is abnormal edema and some hematoma along the regional musculature and tissue planes. Detail obscured by streak artifact. There is some thickening of the distal quadriceps tendon which could be chronic. Soft tissues Mild subcutaneous edema as expected. IMPRESSION: 1. Displaced and mildly overlapped periprosthetic fracture of the distal femur. Mild comminution as detailed above. No fracture of the patella, proximal tibia, or proximal fibula identified. The distal femur fracture fragment and the tibia appear to retain expected alignment. 2. There is some thickening of the  distal quadriceps tendon, uncertain chronicity. Electronically Signed   By: Van Clines M.D.   On: 12/18/2016 15:30   Dg Knee Complete 4 Views Left  Result Date: 12/18/2016 CLINICAL DATA:  Tripped and fall at church. Knee pain. Initial encounter. EXAM: LEFT KNEE - COMPLETE 4+ VIEW COMPARISON:  None. FINDINGS: A comminuted fracture of the distal fibula is present just above the femoral component of the total knee arthroplasty. The patella is intact. Tibia is intact. Mild osteopenia  is noted. Greater than 40 degrees angulation is present. Fracture is displaced posteriorly and laterally. IMPRESSION: 1. Comminuted fracture of the distal femur with severe ventral angulation. The fracture is just above the femoral component of the total knee arthroplasty. Electronically Signed   By: San Morelle M.D.   On: 12/18/2016 13:45   Dg Femur Min 2 Views Left  Result Date: 12/18/2016 CLINICAL DATA:  Fall at church.  Left knee pain.  Initial encounter. EXAM: LEFT FEMUR 2 VIEWS COMPARISON:  Knee radiographs of the same day. FINDINGS: A comminuted fracture of the distal femur air is present just above the femoral component of the total knee arthroplasty. Fracture is displaced posteriorly and laterally with greater than 40 degrees ventral angulation. IMPRESSION: Comminuted distal femur fracture just above the femoral component of the total knee arthroplasty with posterolateral displacement and severe angulation. Electronically Signed   By: San Morelle M.D.   On: 12/18/2016 13:46        Scheduled Meds: . allopurinol  100 mg Oral Daily  . aspirin  81 mg Oral Daily  . calcium-vitamin D  1 tablet Oral Q breakfast  . citalopram  40 mg Oral Daily  . levothyroxine  50 mcg Oral QAC breakfast  . losartan  100 mg Oral Daily  . pantoprazole  40 mg Oral Daily  . predniSONE  5 mg Oral Q breakfast  . senna  1 tablet Oral BID   Continuous Infusions: . dextrose 5 % and 0.45% NaCl 1,000 mL infusion 75 mL/hr at 12/19/16 1704     LOS: 2 days     Cordelia Poche, MD [facility] 12/20/2016, 10:13 AM Pager: [phone]  If 7PM-7AM, please contact night-coverage [email or web address] Password [email or web address] 12/20/2016, 10:13 AM

## 2016-12-20 NOTE — Anesthesia Preprocedure Evaluation (Signed)
Anesthesia Evaluation  Patient identified by MRN, date of birth, ID band Patient awake    Reviewed: Allergy & Precautions, NPO status , Patient's Chart, lab work & pertinent test results  Airway Mallampati: II  TM Distance: >3 FB     Dental  (+) Teeth Intact, Dental Advisory Given   Pulmonary    breath sounds clear to auscultation       Cardiovascular hypertension,  Rhythm:Regular Rate:Normal     Neuro/Psych    GI/Hepatic   Endo/Other    Renal/GU      Musculoskeletal   Abdominal   Peds  Hematology   Anesthesia Other Findings   Reproductive/Obstetrics                             Anesthesia Physical Anesthesia Plan  ASA: III  Anesthesia Plan: Spinal   Post-op Pain Management:    Induction:   PONV Risk Score and Plan: 2 and Ondansetron, Dexamethasone and Propofol infusion  Airway Management Planned: Natural Airway and Simple Face Mask  Additional Equipment:   Intra-op Plan:   Post-operative Plan:   Informed Consent: I have reviewed the patients History and Physical, chart, labs and discussed the procedure including the risks, benefits and alternatives for the proposed anesthesia with the patient or authorized representative who has indicated his/her understanding and acceptance.   Dental advisory given  Plan Discussed with: CRNA and Anesthesiologist  Anesthesia Plan Comments:         Anesthesia Quick Evaluation

## 2016-12-20 NOTE — Anesthesia Postprocedure Evaluation (Signed)
Anesthesia Post Note  Patient: Stacy Morris  Procedure(s) Performed: OPEN REDUCTION INTERNAL FIXATION (ORIF) DISTAL FEMUR FRACTURE (Left Knee)     Patient location during evaluation: PACU Anesthesia Type: Spinal Level of consciousness: awake, awake and alert and oriented Pain management: pain level controlled Vital Signs Assessment: post-procedure vital signs reviewed and stable Respiratory status: spontaneous breathing, nonlabored ventilation and respiratory function stable Cardiovascular status: blood pressure returned to baseline Postop Assessment: spinal receding Anesthetic complications: no    Last Vitals:  Vitals:   12/20/16 1920 12/20/16 1931  BP:  (!) 154/72  Pulse:  66  Resp:  19  Temp: 36.5 C   SpO2:  93%    Last Pain:  Vitals:   12/20/16 1920  TempSrc:   PainSc: 0-No pain                 Dilyn Smiles COKER

## 2016-12-20 NOTE — Progress Notes (Signed)
Patient ID: Stacy Morris, female   DOB: 09-02-1933, 83 y.o.   MRN: [id number]   LOS: 2 days   Subjective: Doing well. Surgery cancelled last night for scheduling reasons, planned for this afternoon with Dr. Doreatha Martin.   Objective: Vital signs in last 24 hours: Temp:  [97.9 F (36.6 C)-98.1 F (36.7 C)] 97.9 F (36.6 C) (11/06 0611) Pulse Rate:  [62-63] 62 (11/06 0611) Resp:  [12-16] 16 (11/06 0611) BP: (131-148)/(50-54) 148/54 (11/06 0611) SpO2:  [90 %-96 %] 96 % (11/06 0611) Last BM Date: 12/18/16   Laboratory  CBC Recent Labs    12/18/16 1402 12/19/16 0541  WBC 8.9 9.4  HGB 10.2* 9.6*  HCT 31.9* 30.5*  PLT 185 190   BMET Recent Labs    12/18/16 1402 12/19/16 0541  NA 137 136  K 5.0 4.5  CL 106 105  CO2 24 25  GLUCOSE 111* 92  BUN 40* 38*  CREATININE 2.27* 2.29*  CALCIUM 9.6 9.5     Physical Exam General appearance: alert and no distress  LLE: KI in place, leg externally rotated and short, 2+ DP, 0 PT with 1+ NP edema   Assessment/Plan: Left periprosthetic distal femur fx -- NPO for now, surgery this afternoon, PT/OT to start tomorrow.    Lisette Abu, PA-C Orthopedic Surgery [phone] 12/20/2016

## 2016-12-21 ENCOUNTER — Encounter (HOSPITAL_COMMUNITY): Payer: Self-pay | Admitting: Student

## 2016-12-21 DIAGNOSIS — W19XXXA Unspecified fall, initial encounter: Secondary | ICD-10-CM

## 2016-12-21 LAB — BASIC METABOLIC PANEL
Anion gap: 7 (ref 5–15)
BUN: 33 mg/dL — AB (ref 6–20)
CHLORIDE: 108 mmol/L (ref 101–111)
CO2: 23 mmol/L (ref 22–32)
CREATININE: 2.15 mg/dL — AB (ref 0.44–1.00)
Calcium: 9.3 mg/dL (ref 8.9–10.3)
GFR calc Af Amer: 24 mL/min — ABNORMAL LOW (ref >60)
GFR calc non Af Amer: 20 mL/min — ABNORMAL LOW (ref >60)
GLUCOSE: 156 mg/dL — AB (ref 65–99)
Potassium: 4.5 mmol/L (ref 3.5–5.1)
SODIUM: 138 mmol/L (ref 135–145)

## 2016-12-21 LAB — CBC
HCT: 28.1 % — ABNORMAL LOW (ref 36.0–46.0)
Hemoglobin: 8.9 g/dL — ABNORMAL LOW (ref 12.0–15.0)
MCH: 31.3 pg (ref 26.0–34.0)
MCHC: 31.7 g/dL (ref 30.0–36.0)
MCV: 98.9 fL (ref 78.0–100.0)
PLATELETS: 185 10*3/uL (ref 150–400)
RBC: 2.84 MIL/uL — ABNORMAL LOW (ref 3.87–5.11)
RDW: 13.6 % (ref 11.5–15.5)
WBC: 8 10*3/uL (ref 4.0–10.5)

## 2016-12-21 LAB — URINE CULTURE

## 2016-12-21 MED ORDER — DEXTROSE-NACL 5-0.45 % IV SOLN
INTRAVENOUS | Status: DC
  Administered 2016-12-21: 11:00:00 via INTRAVENOUS

## 2016-12-21 NOTE — Evaluation (Signed)
Physical Therapy Evaluation Patient Details Name: Stacy Morris MRN: [id number] DOB: Oct 28, 1933 Today's Date: 12/21/2016   History of Present Illness  83 y.o. female admitted on 12/18/16 [place] with left distal femur fx s/p ORIF on 12/20/16.  Pt postoperatively is TDWB left leg.  She has significant PMH of Fe deficient anemia, HTN, gout, CDK III, bil TKA, and back surgery.   Clinical Impression  Pt mobilized better than I anticipated OOB to recliner chair with RW, however, she does struggle to keep the TDWB on her left leg when up.  She will need SNF level rehab at discharge and is requesting [facility] in [place].   PT to follow acutely for deficits listed below.       Follow Up Recommendations SNF    Equipment Recommendations  Rolling walker with 5" wheels    Recommendations for Other Services   NA    Precautions / Restrictions Precautions Precautions: Fall Restrictions Weight Bearing Restrictions: Yes LLE Weight Bearing: Touchdown weight bearing(per op note)      Mobility  Bed Mobility Overal bed mobility: Needs Assistance Bed Mobility: Supine to Sit     Supine to sit: HOB elevated;Min assist     General bed mobility comments: Min assist to help progress left leg over EOB and then support trunk.  HOB elevated and pt using bed raill for leverage at trunk, but helping PT quite a bit.   Transfers Overall transfer level: Needs assistance Equipment used: Rolling walker (2 wheeled) Transfers: Sit to/from [facility] Sit to Stand: +2 physical assistance;Mod assist Stand pivot transfers: +2 physical assistance;Mod assist       General transfer comment: Two person mod assist to get to standing from bed pt unable to push up from bed and had to have both hands on RW, so therapist stabilized RW and supported pt's trunk for transition to stand. Verbal cues for TDWB on left leg and to pivot on her right with weight through her hands.  Pt had  difficulty keeping TDWB on her right foot initially and with the turn.   Ambulation/Gait             General Gait Details: Pt is not strong enough and cannot keep enough weight off of her foot at this tiem to attempt.          Balance Overall balance assessment: Needs assistance Sitting-balance support: Feet supported;Bilateral upper extremity supported Sitting balance-Leahy Scale: Fair     Standing balance support: Bilateral upper extremity supported Standing balance-Leahy Scale: Poor                               Pertinent Vitals/Pain Pain Assessment: Faces Faces Pain Scale: Hurts even more Pain Location: left leg Pain Descriptors / Indicators: Aching;Burning;Grimacing;Guarding Pain Intervention(s): Limited activity within patient's tolerance;Monitored during session;Repositioned    Home Living Family/patient expects to be discharged to:: Skilled nursing facility Living Arrangements: Alone Available Help at Discharge: Family;Available PRN/intermittently Type of Home: House Home Access: Stairs to enter Entrance Stairs-Rails: Right Entrance Stairs-Number of Steps: 2 Home Layout: One level Home Equipment: Cane - single point;Bedside commode;Shower seat;Grab bars - tub/shower      Prior Function Level of Independence: Independent with assistive device(s)         Comments: used cane for community ambulation, otherwise mows the grass, cooks, cleans, drives and is completely independent.      Hand Dominance   Dominant  Hand: Right    Extremity/Trunk Assessment   Upper Extremity Assessment Upper Extremity Assessment: Defer to OT evaluation    Lower Extremity Assessment Lower Extremity Assessment: LLE deficits/detail LLE Deficits / Details: left leg with normal post op pain and weakness, ankle at least 3/5, knee 2/5, hip flexion 2+/5    Cervical / Trunk Assessment Cervical / Trunk Assessment: Other exceptions Cervical / Trunk Exceptions: h/o  lumbar surgery  Communication   Communication: HOH  Cognition Arousal/Alertness: Awake/alert Behavior During Therapy: WFL for tasks assessed/performed Overall Cognitive Status: Within Functional Limits for tasks assessed                                           Exercises Total Joint Exercises Ankle Circles/Pumps: AROM;Both;20 reps Quad Sets: AROM;Left;10 reps Hip ABduction/ADduction: AAROM;Left;10 reps Straight Leg Raises: AROM;Left;10 reps   Assessment/Plan    PT Assessment Patient needs continued PT services  PT Problem List Decreased strength;Decreased range of motion;Decreased activity tolerance;Decreased balance;Decreased mobility;Decreased knowledge of use of DME;Decreased knowledge of precautions;Pain       PT Treatment Interventions DME instruction;Functional mobility training;Therapeutic exercise;Therapeutic activities;Balance training;Patient/family education;Manual techniques;Modalities    PT Goals (Current goals can be found in the Care Plan section)  Acute Rehab PT Goals Patient Stated Goal: to get back home and back to her independence PT Goal Formulation: With patient/family Time For Goal Achievement: 12/28/16 Potential to Achieve Goals: Good    Frequency Min 3X/week   Barriers to discharge Decreased caregiver support;Inaccessible home environment Pt lives alone       AM-PAC PT "6 Clicks" Daily Activity  Outcome Measure Difficulty turning over in bed (including adjusting bedclothes, sheets and blankets)?: Unable Difficulty moving from lying on back to sitting on the side of the bed? : Unable Difficulty sitting down on and standing up from a chair with arms (e.g., wheelchair, bedside commode, etc,.)?: Unable Help needed moving to and from a bed to chair (including a wheelchair)?: A Lot Help needed walking in hospital room?: Total Help needed climbing 3-5 steps with a railing? : Total 6 Click Score: 7    End of Session Equipment  Utilized During Treatment: Gait belt;Left knee immobilizer Activity Tolerance: Patient limited by fatigue;Patient limited by pain Patient left: in chair;with call bell/phone within reach;with family/visitor present   PT Visit Diagnosis: Muscle weakness (generalized) (M62.81);Difficulty in walking, not elsewhere classified (R26.2);Pain Pain - Right/Left: Left Pain - part of body: Leg    Time: 4098-1191 PT Time Calculation (min) (ACUTE ONLY): 34 min   Charges:         Wells Guiles B. Won Kreuzer, PT, DPT [phone]   PT Evaluation $PT Eval Moderate Complexity: 1 Mod PT Treatments $Therapeutic Activity: 8-22 mins   12/21/2016, 10:56 AM

## 2016-12-21 NOTE — Plan of Care (Signed)
Patient is able to rate pain on a scale of 1 to 10 and request pain medication when needed.

## 2016-12-21 NOTE — Progress Notes (Addendum)
PROGRESS NOTE    Stacy Morris  AVW:[id number] DOB: 02/22/1933 DOA: 12/18/2016 PCP: Stephens Shire, MD   Brief Narrative:  Stacy Morris is a 83 y.o. female with a history of bilateral total knee replacements, chronic kidney disease stage III, rheumatoid arthritis, hypertension, and hypothyroidism as well as other comorbids who presented after falling in church and was found to have a left distal femur fracture.  Orthopedic surgery was consulted and took the patient to the OR yesterday for ORIF of left supracondylar distal femur fracture. She is POD 1 and PT evaluated and recommended SNF.   Assessment & Plan:   Principal Problem:   Closed comminuted intra-articular fracture of distal end of left femur ([place]) Active Problems:   Rheumatoid arthritis ([place])   Essential hypertension   CKD (chronic kidney disease), stage III (H[facility])   Hypothyroidism  Left distal femur fracture s/p ORIF POD 1 -S/p ORIF of left supracondylar distal femur fracture. -Pain is managed with analgesia per Ortho and C/w Bowel Regimen  -Orthopedic surgery recommendations appreciated -C/w TDWB LLE with No restrictions -Dressing to be taken down tomorrow -PT/OT Recommending SNF -C/w Lovenox VTE Prophylaxis per Ortho  Rheumatoid arthritis -Stable.Patient is on chronic daily prednisone and Enbrel once a week. -Continue Prednisone 5 mg daily  Hypothyroidism -Last TSH of 12 in 2016. Check TSH this visit -Patient follows outpatient.  No symptoms of hypothyroidism. -Continue Synthroid 50 mcg po daily   Essential hypertension -Controlled with low diastolic pressure -hold losartan secondary to kidney disease for now  CKD stage III now likely progressed to Stage 4 -Stable although, baseline may be lower -Held losartan for now -BUN/Cr went from 38/2.29 -> 33/2.15 -C/w IVF -Avoid nephrotoxic medications -Follow up with Dr. Servando Salina as an outpatient  GERD -Stable -Continue  PPI  Depression -Stable -Continue Citalopram 40 mg po Daily  Normocytic Anemia/Anemia of Chronic Kidney Disease -Likely slight Drop post Op -Hb/Hct went from 10.2/31.9 -> 9.6/30.5 -> 9.3/29.2 -> 8.9/28.1 -Continue to Monitor for S/Sx of Bleeding  -Repeat CBC in AM   DVT prophylaxis: Enoxaparin 40 mg sq q24h Code Status: FULL CODE Family Communication: Discussed with Daughter at bedside Disposition Plan: SNF when medically stable  Consultants:   Orthopedic Surgery    Procedures:  ORIF of left supracondylar distal femur fracture on 11/6 done by Dr. Doreatha Martin   Antimicrobials: Anti-infectives (From admission, onward)   Start     Dose/Rate Route Frequency Ordered Stop   12/21/16 0600  ceFAZolin (ANCEF) IVPB 2g/100 mL premix     2 g 200 mL/hr over 30 Minutes Intravenous On call to O.R. 12/20/16 1348 12/20/16 1600   12/20/16 2100  ceFAZolin (ANCEF) IVPB 1 g/50 mL premix     1 g 100 mL/hr over 30 Minutes Intravenous Every 6 hours 12/20/16 1947 12/21/16 1012   12/20/16 1753  vancomycin (VANCOCIN) powder  Status:  Discontinued       As needed 12/20/16 1754 12/20/16 1812   12/20/16 1355  ceFAZolin (ANCEF) 2-4 GM/100ML-% IVPB    Comments:  Grace Blight   : cabinet override      12/20/16 1355 12/20/16 1530   12/19/16 0700  ceFAZolin (ANCEF) IVPB 2g/100 mL premix  Status:  Discontinued     2 g 200 mL/hr over 30 Minutes Intravenous To [facility] 12/18/16 1946 12/19/16 1140     Subjective: Seen and examined and is Hard of Hearing. No complaints and states she feels ok. Wants to get up with Therapy.   Objective:  Vitals:   12/21/16 0021 12/21/16 0532 12/21/16 1200 12/21/16 1419  BP: (!) 145/66 (!) 151/57  137/88  Pulse: 73 67  80  Resp: 16 15  16   Temp: 98.6 F (37 C) 97.8 F (36.6 C)  98.2 F (36.8 C)  TempSrc: Oral Oral  Oral  SpO2: 96% 96%  98%  Weight:   90.7 kg (200 lb)   Height:   5\' 6"  (1.676 m)     Intake/Output Summary (Last 24 hours) at 12/21/2016  1851 Last data filed at 12/21/2016 1700 Gross per 24 hour  Intake 2059.42 ml  Output 750 ml  Net 1309.42 ml   Filed Weights   12/21/16 1200  Weight: 90.7 kg (200 lb)   Examination: Physical Exam:  Constitutional: WN/WD obese Caucasian female in NAD and appears calm and comfortable Eyes: Lids and conjunctivae normal, sclerae anicteric  ENMT: External Ears, Nose appear normal. Grossly normal hearing. Mucous membranes are moist. Neck: Appears normal, supple, no cervical masses, normal ROM, no appreciable thyromegaly, no JVD Respiratory: Clear to auscultation bilaterally, no wheezing, rales, rhonchi or crackles. Normal respiratory effort and patient is not tachypenic. No accessory muscle use.  Cardiovascular: RRR, no murmurs / rubs / gallops. S1 and S2 auscultated. No appreciable  extremity edema.  Abdomen: Soft, non-tender, non-distended. No masses palpated. No appreciable hepatosplenomegaly. Bowel sounds positive.  GU: Deferred. Musculoskeletal: No clubbing / cyanosis of digits/nails. No joint deformity upper and lower extremities. Left Leg covered in ortho brace.  Skin: No rashes, lesions, ulcers on a limited skin eval. No induration; Warm and dry.  Neurologic: CN 2-12 grossly intact with no focal deficits. Sensation intact in all 4 Extremities. Romberg sign and cerebellar reflexes not assessed.  Psychiatric: Normal judgment and insight. Alert and oriented x 3. Normal mood and appropriate affect.   Data Reviewed: I have personally reviewed following labs and imaging studies  CBC: Recent Labs  Lab 12/18/16 1402 12/19/16 0541 12/20/16 1954 12/21/16 0440  WBC 8.9 9.4 11.5* 8.0  NEUTROABS 6.2  --   --   --   HGB 10.2* 9.6* 9.3* 8.9*  HCT 31.9* 30.5* 29.2* 28.1*  MCV 98.8 100.3* 100.0 98.9  PLT 185 190 172 834   Basic Metabolic Panel: Recent Labs  Lab 12/18/16 1402 12/19/16 0541 12/20/16 1954 12/21/16 0440  NA 137 136  --  138  K 5.0 4.5  --  4.5  CL 106 105  --  108   CO2 24 25  --  23  GLUCOSE 111* 92  --  156*  BUN 40* 38*  --  33*  CREATININE 2.27* 2.29* 1.95* 2.15*  CALCIUM 9.6 9.5  --  9.3   GFR: Estimated Creatinine Clearance: 23.3 mL/min (A) (by C-G formula based on SCr of 2.15 mg/dL (H)). Liver Function Tests: Recent Labs  Lab 12/18/16 1402  AST 19  ALT 16  ALKPHOS 71  BILITOT 1.1  PROT 6.8  ALBUMIN 3.5   No results for input(s): LIPASE, AMYLASE in the last 168 hours. No results for input(s): AMMONIA in the last 168 hours. Coagulation Profile: No results for input(s): INR, PROTIME in the last 168 hours. Cardiac Enzymes: No results for input(s): CKTOTAL, CKMB, CKMBINDEX, TROPONINI in the last 168 hours. BNP (last 3 results) No results for input(s): PROBNP in the last 8760 hours. HbA1C: No results for input(s): HGBA1C in the last 72 hours. CBG: No results for input(s): GLUCAP in the last 168 hours. Lipid Profile: No results for input(s): CHOL, HDL,  LDLCALC, TRIG, CHOLHDL, LDLDIRECT in the last 72 hours. Thyroid Function Tests: No results for input(s): TSH, T4TOTAL, FREET4, T3FREE, THYROIDAB in the last 72 hours. Anemia Panel: No results for input(s): VITAMINB12, FOLATE, FERRITIN, TIBC, IRON, RETICCTPCT in the last 72 hours. Sepsis Labs: No results for input(s): PROCALCITON, LATICACIDVEN in the last 168 hours.  Recent Results (from the past 240 hour(s))  MRSA PCR Screening     Status: None   Collection Time: 12/19/16  6:50 AM  Result Value Ref Range Status   MRSA by PCR NEGATIVE NEGATIVE Final    Comment:        The GeneXpert MRSA Assay (FDA approved for NASAL specimens only), is one component of a comprehensive MRSA colonization surveillance program. It is not intended to diagnose MRSA infection nor to guide or monitor treatment for MRSA infections.   Urine culture     Status: Abnormal   Collection Time: 12/20/16 11:46 AM  Result Value Ref Range Status   Specimen Description URINE, CLEAN CATCH  Final   Special  Requests NONE  Final   Culture MULTIPLE SPECIES PRESENT, SUGGEST RECOLLECTION (A)  Final   Report Status 12/21/2016 FINAL  Final  Surgical pcr screen     Status: None   Collection Time: 12/20/16  1:20 PM  Result Value Ref Range Status   MRSA, PCR NEGATIVE NEGATIVE Final   Staphylococcus aureus NEGATIVE NEGATIVE Final    Comment: (NOTE) The Xpert SA Assay (FDA approved for NASAL specimens in patients 19 years of age and older), is one component of a comprehensive surveillance program. It is not intended to diagnose infection nor to guide or monitor treatment.     Radiology Studies: Dg Knee Left Port  Result Date: 12/20/2016 CLINICAL DATA:  Postoperative left knee surgery. EXAM: PORTABLE LEFT KNEE - 1-2 VIEW COMPARISON:  12/18/2016 FINDINGS: Previous left total knee arthroplasty. Since the previous study, there has been interval lateral plate and screw fixation of comminuted periprosthetic fractures of the distal femoral metaphysis. Near-anatomic alignment and position of the fracture fragments is demonstrated with small displaced butterfly fragments remaining. Surgical hardware appears well seated. Soft tissue gas consistent with recent surgery. No dislocation. IMPRESSION: Internal plate and screw fixation of comminuted fractures of the distal left femoral metaphysis with improved alignment and position since previous study. Pre-existing left knee arthroplasty. Electronically Signed   By: Lucienne Capers M.D.   On: 12/20/2016 23:04   Dg C-arm 61-120 Min  Result Date: 12/20/2016 CLINICAL DATA:  Left distal femoral fracture open-reduction internal-fixation. EXAM: DG C-ARM 61-120 MIN; LEFT FEMUR 2 VIEWS COMPARISON:  12/18/2016 FINDINGS: Intraoperative fluoroscopic images from open-reduction internal-fixation of distal left femoral fracture demonstrate placement of sideplate and screws across the distal left femur affixing the previously severely displaced distal femoral fracture. The alignment is  near anatomic. Stable appearance of pre-existing total left hip arthroplasty hardware. Fluoroscopy time is recorded as 2 minutes 55 seconds. IMPRESSION: Improved alignment post sideplate and screw fixation of distal left femoral fracture. Electronically Signed   By: Fidela Salisbury M.D.   On: 12/20/2016 19:37   Dg Femur Min 2 Views Left  Result Date: 12/20/2016 CLINICAL DATA:  Left distal femoral fracture open-reduction internal-fixation. EXAM: DG C-ARM 61-120 MIN; LEFT FEMUR 2 VIEWS COMPARISON:  12/18/2016 FINDINGS: Intraoperative fluoroscopic images from open-reduction internal-fixation of distal left femoral fracture demonstrate placement of sideplate and screws across the distal left femur affixing the previously severely displaced distal femoral fracture. The alignment is near anatomic. Stable appearance of pre-existing  total left hip arthroplasty hardware. Fluoroscopy time is recorded as 2 minutes 55 seconds. IMPRESSION: Improved alignment post sideplate and screw fixation of distal left femoral fracture. Electronically Signed   By: Fidela Salisbury M.D.   On: 12/20/2016 19:37   Scheduled Meds: . allopurinol  100 mg Oral Daily  . aspirin  81 mg Oral Daily  . calcium-vitamin D  1 tablet Oral Q breakfast  . citalopram  40 mg Oral Daily  . docusate sodium  100 mg Oral BID  . enoxaparin (LOVENOX) injection  40 mg Subcutaneous Q24H  . levothyroxine  50 mcg Oral QAC breakfast  . pantoprazole  40 mg Oral Daily  . polyethylene glycol  17 g Oral Daily  . predniSONE  5 mg Oral Q breakfast  . senna  1 tablet Oral BID   Continuous Infusions: . sodium chloride Stopped (12/21/16 1500)  . 0.9 % NaCl with KCl 20 mEq / L Stopped (12/21/16 0942)  . dextrose 5 % and 0.45% NaCl 75 mL/hr at 12/21/16 1030    LOS: 3 days   Kerney Elbe, DO [facility] Pager [phone]  If 7PM-7AM, please contact night-coverage [email or web address] Password [facility] 12/21/2016, 6:51 PM

## 2016-12-21 NOTE — Progress Notes (Signed)
OT Cancellation Note  Patient Details Name: Stacy Morris MRN: [id number] DOB: 08-04-1933   Cancelled Treatment:    Reason Eval/Treat Not Completed: Other (comment); Pt just had dinner delivered and visiting with family, has been up in chair today and already positioned back in bed; politely requesting OT return at a later time for eval. Will follow up as schedule permits.  Lou Cal, OT Pager [phone] 12/21/2016   [name] 12/21/2016, 5:03 PM

## 2016-12-21 NOTE — Progress Notes (Signed)
Physical Therapy Treatment Patient Details Name: Stacy Morris MRN: [id number] DOB: 03/31/1933 Today's Date: 12/21/2016    History of Present Illness 83 y.o. female admitted on 12/18/16 [place] with [place] distal femur fx s/p ORIF on 12/20/16.  Pt postoperatively is TDWB left leg.  She has significant PMH of Fe deficient anemia, HTN, gout, CDK III, bil TKA, and back surgery.     PT Comments    NT requesting assist to help pt back to bed. Performed stand pivot back to bed with mod A +2. Pt continues to present with difficulty maintaining TDWB on LLE and required cues throughout to maintain. Current recommendations remain appropriate. Will continue to follow acutely to maximize functional mobility independence and safety.    Follow Up Recommendations  SNF(family requesting [facility] [place] )     Clinical biochemist with 5" wheels    Recommendations for Other Services       Precautions / Restrictions Precautions Precautions: Fall Restrictions Weight Bearing Restrictions: Yes LLE Weight Bearing: Touchdown weight bearing    Mobility  Bed Mobility Overal bed mobility: Needs Assistance Bed Mobility: Sit to Supine       Sit to supine: Min assist   General bed mobility comments: Min A for LLE assist back to bed.   Transfers Overall transfer level: Needs assistance Equipment used: Rolling walker (2 wheeled) Transfers: Sit to/from [facility] Sit to Stand: +2 physical assistance;Mod assist Stand pivot transfers: +2 physical assistance;Mod assist       General transfer comment: Pt able to push up from arm of the chair, however, required one UE on RW to pull up with, so PT braced RW. Mod A +2 for lift assist and steadying to stand and during stand pivot back to bed. Required cues to maintain TDWB on LLE, as pt had difficulty maintaining.   Ambulation/Gait             General Gait Details: NT   Stairs             Wheelchair Mobility    Modified Rankin (Stroke Patients Only)       Balance Overall balance assessment: Needs assistance Sitting-balance support: Feet supported;Bilateral upper extremity supported Sitting balance-Leahy Scale: Fair     Standing balance support: Bilateral upper extremity supported Standing balance-Leahy Scale: Poor Standing balance comment: Reliant on UE support and external assist to maintain standing.                             Cognition Arousal/Alertness: Awake/alert Behavior During Therapy: WFL for tasks assessed/performed Overall Cognitive Status: Within Functional Limits for tasks assessed                                        Exercises      General Comments General comments (skin integrity, edema, etc.): Pt's family present during session.       Pertinent Vitals/Pain Pain Assessment: Faces Faces Pain Scale: Hurts even more Pain Location: left leg Pain Descriptors / Indicators: Aching;Burning;Grimacing;Guarding Pain Intervention(s): Limited activity within patient's tolerance;Monitored during session;Repositioned    Home Living                      Prior Function            PT Goals (current goals can  now be found in the care plan section) Acute Rehab PT Goals Patient Stated Goal: to get back home and back to her independence PT Goal Formulation: With patient/family Time For Goal Achievement: 12/28/16 Potential to Achieve Goals: Good Progress towards PT goals: Progressing toward goals    Frequency    Min 3X/week      PT Plan Current plan remains appropriate    Co-evaluation              AM-PAC PT "6 Clicks" Daily Activity  Outcome Measure  Difficulty turning over in bed (including adjusting bedclothes, sheets and blankets)?: Unable Difficulty moving from lying on back to sitting on the side of the bed? : Unable Difficulty sitting down on and standing up from a chair with  arms (e.g., wheelchair, bedside commode, etc,.)?: Unable Help needed moving to and from a bed to chair (including a wheelchair)?: A Lot Help needed walking in hospital room?: Total Help needed climbing 3-5 steps with a railing? : Total 6 Click Score: 7    End of Session Equipment Utilized During Treatment: Gait belt;Left knee immobilizer Activity Tolerance: Patient tolerated treatment well Patient left: in bed;with call bell/phone within reach;with nursing/sitter in room;with family/visitor present Nurse Communication: Mobility status PT Visit Diagnosis: Muscle weakness (generalized) (M62.81);Difficulty in walking, not elsewhere classified (R26.2);Pain Pain - Right/Left: Left Pain - part of body: Leg     Time: 3818-4037 PT Time Calculation (min) (ACUTE ONLY): 10 min  Charges:  $Therapeutic Activity: 8-22 mins                    G Codes:       Leighton Ruff, PT, DPT  Acute Rehabilitation Services  Pager: [phone]    Rudean Hitt 12/21/2016, 3:31 PM

## 2016-12-21 NOTE — Progress Notes (Signed)
Orthopaedic Trauma Progress Note  S: Doing well, pain controlled. Out of bed with therapy today  O:  Vitals:   12/21/16 0532 12/21/16 1419  BP: (!) 151/57 137/88  Pulse: 67 80  Resp: 15 16  Temp: 97.8 F (36.6 C) 98.2 F (36.8 C)  SpO2: 96% 98%   LLE: Dressing clean, dry and intact. Compartments soft. Able to bend knee up to 70 degrees without significant discomfort. Intact DF/PF. Sensation intact. Warm and well perfused foot.  Labs: Hgb 79.96  A/P: 83 year old female s/p ORIF of left periprosthetic distal femur fracture  TDWB LLE No ROM restrictions Take down dressing tomorrow PT/OT Pain control Recommend Lovenox for VTE Dispo: SNF  Shona Needles, MD [facility] [phone] (phone)

## 2016-12-22 DIAGNOSIS — N183 Chronic kidney disease, stage 3 (moderate): Secondary | ICD-10-CM

## 2016-12-22 DIAGNOSIS — S72492A Other fracture of lower end of left femur, initial encounter for closed fracture: Secondary | ICD-10-CM

## 2016-12-22 DIAGNOSIS — Z419 Encounter for procedure for purposes other than remedying health state, unspecified: Secondary | ICD-10-CM

## 2016-12-22 DIAGNOSIS — M069 Rheumatoid arthritis, unspecified: Secondary | ICD-10-CM

## 2016-12-22 DIAGNOSIS — I10 Essential (primary) hypertension: Secondary | ICD-10-CM

## 2016-12-22 LAB — BASIC METABOLIC PANEL
ANION GAP: 5 (ref 5–15)
BUN: 37 mg/dL — ABNORMAL HIGH (ref 6–20)
CHLORIDE: 109 mmol/L (ref 101–111)
CO2: 24 mmol/L (ref 22–32)
Calcium: 9.6 mg/dL (ref 8.9–10.3)
Creatinine, Ser: 2.12 mg/dL — ABNORMAL HIGH (ref 0.44–1.00)
GFR, EST AFRICAN AMERICAN: 24 mL/min — AB (ref >60)
GFR, EST NON AFRICAN AMERICAN: 21 mL/min — AB (ref >60)
Glucose, Bld: 114 mg/dL — ABNORMAL HIGH (ref 65–99)
POTASSIUM: 4.3 mmol/L (ref 3.5–5.1)
SODIUM: 138 mmol/L (ref 135–145)

## 2016-12-22 LAB — CBC
HEMATOCRIT: 25.9 % — AB (ref 36.0–46.0)
HEMOGLOBIN: 8.2 g/dL — AB (ref 12.0–15.0)
MCH: 31.7 pg (ref 26.0–34.0)
MCHC: 31.7 g/dL (ref 30.0–36.0)
MCV: 100 fL (ref 78.0–100.0)
Platelets: 215 10*3/uL (ref 150–400)
RBC: 2.59 MIL/uL — AB (ref 3.87–5.11)
RDW: 14 % (ref 11.5–15.5)
WBC: 10.5 10*3/uL (ref 4.0–10.5)

## 2016-12-22 LAB — MAGNESIUM: Magnesium: 2 mg/dL (ref 1.7–2.4)

## 2016-12-22 LAB — PHOSPHORUS: PHOSPHORUS: 3.5 mg/dL (ref 2.5–4.6)

## 2016-12-22 NOTE — NC FL2 (Signed)
[place] LEVEL OF CARE SCREENING TOOL     IDENTIFICATION  Patient Name: Stacy Morris Birthdate: 1933-12-04 Sex: female Admission Date (Current Location): 12/18/2016  [facility] and [place] Number:  Herbalist and Address:  [facility], [place] [address], [place], [place] [address]      Provider Number: [id number]  Attending Physician Name and Address:  Kerney Elbe, DO  Relative Name and Phone Number:  Dorien Chihuahua, daughter, [phone]    Current Level of Care: Hospital Recommended Level of Care: [place] Prior Approval Number:    Date Approved/Denied:   PASRR Number: [id number]  Discharge Plan: SNF    Current Diagnoses: Patient Active Problem List   Diagnosis Date Noted  . Closed comminuted intra-articular fracture of distal end of left femur ([place]) 12/18/2016  . CKD (chronic kidney disease), stage III ([place]) 12/18/2016    Class: Chronic  . Hypothyroidism 12/18/2016    Class: Chronic  . Renal failure (ARF), acute on chronic (HCC) 04/09/2014  . Rheumatoid arthritis ([place]) 04/09/2014  . Essential hypertension 04/09/2014  . Vomiting and diarrhea 04/09/2014  . Acute on chronic renal failure ([place]) 04/09/2014    Orientation RESPIRATION BLADDER Height & Weight     Self, Time, Situation, Place  O2(Nasal Cannula 2L) Incontinent, Indwelling catheter Weight: 200 lb (90.7 kg) Height:  5\' 6"  (167.6 cm)  BEHAVIORAL SYMPTOMS/MOOD NEUROLOGICAL BOWEL NUTRITION STATUS      Continent Diet(see DC summary)  AMBULATORY STATUS COMMUNICATION OF NEEDS Skin   Limited Assist Verbally Surgical wounds                       Personal Care Assistance Level of Assistance  Dressing, Bathing, Feeding Bathing Assistance: Maximum assistance Feeding assistance: Independent Dressing Assistance: Maximum assistance     Functional Limitations Info  Sight, Hearing, Speech Sight Info: Adequate Hearing Info:  Impaired Speech Info: Adequate    SPECIAL CARE FACTORS FREQUENCY  PT (By licensed PT), OT (By licensed OT)     PT Frequency: 3x week OT Frequency: 2x week            Contractures Contractures Info: Not present    Additional Factors Info  Code Status, Allergies, Psychotropic Code Status Info: Full Code Allergies Info: Codeine Psychotropic Info: Celexa         Current Medications (12/22/2016):  This is the current hospital active medication list Current Facility-Administered Medications  Medication Dose Route Frequency Provider Last Rate Last Dose  . 0.9 %  sodium chloride infusion   Intravenous Continuous Roberts Gaudy, MD   Stopped at 12/21/16 1500  . 0.9 % NaCl with KCl 20 mEq/ L  infusion   Intravenous Continuous Ainsley Spinner, PA-C   Stopped at 12/21/16 2878  . allopurinol (ZYLOPRIM) tablet 100 mg  100 mg Oral Daily Lady Deutscher, MD   100 mg at 12/22/16 0940  . ALPRAZolam Duanne Moron) tablet 0.5 mg  0.5 mg Oral TID PRN Lady Deutscher, MD      . aspirin chewable tablet 81 mg  81 mg Oral Daily Lady Deutscher, MD   81 mg at 12/22/16 0939  . bisacodyl (DULCOLAX) EC tablet 5 mg  5 mg Oral Daily PRN Lady Deutscher, MD      . bisacodyl (DULCOLAX) EC tablet 5 mg  5 mg Oral Daily PRN Ainsley Spinner, PA-C      . calcium-vitamin D (OSCAL WITH D) 500-200 MG-UNIT per tablet  1 tablet  1 tablet Oral Q breakfast Lady Deutscher, MD   1 tablet at 12/22/16 0950  . cholestyramine (QUESTRAN) packet 4 g  4 g Oral PRN Lady Deutscher, MD      . citalopram (CELEXA) tablet 40 mg  40 mg Oral Daily Lady Deutscher, MD   40 mg at 12/22/16 0939  . docusate sodium (COLACE) capsule 100 mg  100 mg Oral BID Ainsley Spinner, PA-C   100 mg at 12/22/16 5625  . enoxaparin (LOVENOX) injection 40 mg  40 mg Subcutaneous Q24H Ainsley Spinner, PA-C   40 mg at 12/22/16 0940  . levothyroxine (SYNTHROID, LEVOTHROID) tablet 50 mcg  50 mcg Oral QAC breakfast Lady Deutscher, MD   50 mcg at 12/22/16 0940  .  loperamide (IMODIUM) capsule 4 mg  4 mg Oral PRN Lady Deutscher, MD      . metoCLOPramide (REGLAN) tablet 5-10 mg  5-10 mg Oral Q8H PRN Ainsley Spinner, PA-C       Or  . metoCLOPramide (REGLAN) injection 5-10 mg  5-10 mg Intravenous Q8H PRN Ainsley Spinner, PA-C      . morphine 4 MG/ML injection 1 mg  1 mg Intravenous Q2H PRN Ainsley Spinner, PA-C      . ondansetron [facility]) tablet 4 mg  4 mg Oral Q6H PRN Ainsley Spinner, PA-C       Or  . ondansetron [facility]) injection 4 mg  4 mg Intravenous Q6H PRN Ainsley Spinner, PA-C      . oxyCODONE (Oxy IR/ROXICODONE) immediate release tablet 15 mg  15 mg Oral Q4H PRN Lady Deutscher, MD   15 mg at 12/22/16 6389  . pantoprazole (PROTONIX) EC tablet 40 mg  40 mg Oral Daily Lady Deutscher, MD   40 mg at 12/22/16 0940  . polyethylene glycol (MIRALAX / GLYCOLAX) packet 17 g  17 g Oral Daily PRN Lady Deutscher, MD      . polyethylene glycol (MIRALAX / GLYCOLAX) packet 17 g  17 g Oral Daily Ainsley Spinner, PA-C   17 g at 12/22/16 0941  . predniSONE (DELTASONE) tablet 5 mg  5 mg Oral Q breakfast Lady Deutscher, MD   5 mg at 12/22/16 0940  . senna (SENOKOT) tablet 8.6 mg  1 tablet Oral BID Lady Deutscher, MD   8.6 mg at 12/22/16 0940     Discharge Medications: Please see discharge summary for a list of discharge medications.  Relevant Imaging Results:  Relevant Lab Results:   Additional Information Ss#:[id number] [place], LCSW

## 2016-12-22 NOTE — Progress Notes (Signed)
Orthopaedic Trauma Progress Note  S: No issues overnight, pain controlled  O:  Vitals:   12/21/16 2130 12/22/16 0553  BP: 110/65 126/67  Pulse: 70 77  Resp: 18 18  Temp: 97.8 F (36.6 C) 97.7 F (36.5 C)  SpO2: 99% 99%   LLE: Dressing clean, dry and intact, removed and left open to air. Compartments soft. Able to bend knee up to 70 degrees without significant discomfort. Intact DF/PF. Sensation intact. Warm and well perfused foot.  Labs: Hgb 74.25  A/P: 83 year old female s/p ORIF of left periprosthetic distal femur fracture  TDWB LLE No ROM restrictions Leaving incisions open to air, may shower PT/OT Pain control Recommend Lovenox for VTE Dispo: SNF  Shona Needles, MD [facility] [phone] (phone)

## 2016-12-22 NOTE — Progress Notes (Signed)
I will begin insurance approval with [employer] in the morning for a possible inpt rehab admit tomorrow pending insurance approval. I will discuss with pt and family in the morning. [phone]

## 2016-12-22 NOTE — Progress Notes (Signed)
PROGRESS NOTE    Stacy Morris  DPO:[id number] DOB: 1934/01/04 DOA: 12/18/2016 PCP: Stephens Shire, MD   Brief Narrative:  Stacy Morris is a 83 y.o. female with a history of bilateral total knee replacements, chronic kidney disease stage III, rheumatoid arthritis, hypertension, and hypothyroidism as well as other comorbids who presented after falling in church and was found to have a left distal femur fracture.  Orthopedic surgery was consulted and took the patient to the OR yesterday for ORIF of left supracondylar distal femur fracture. She is POD 2 and PT evaluated and recommended CIR today so Inpatient Rehab Physiatrist was consulted for evaluation.   Assessment & Plan:   Principal Problem:   Closed comminuted intra-articular fracture of distal end of left femur ([place]) Active Problems:   Rheumatoid arthritis ([place])   Essential hypertension   CKD (chronic kidney disease), stage III (HCC)   Hypothyroidism  Left distal femur fracture s/p ORIF POD 2 -S/p ORIF of left supracondylar distal femur fracture. -Pain is managed with analgesia per Ortho and C/w Bowel Regimen  -Orthopedic surgery recommendations appreciated -C/w TDWB LLE with no ROM Restrictions; Per Ortho Leaving Incisions open to Air and patient may shower  -Dressing to be taken down tomorrow -PT/OT Recommending CIR Now -C/w Lovenox VTE Prophylaxis per Ortho -Rehab Admission Coordinator to evaluate in AM   Rheumatoid Arthritis -Stable.Patient is on chronic daily prednisone and Enbrel once a week. -Continue Prednisone 5 mg daily; Hold Enbrel  Hypothyroidism -Last TSH of 12 in 2016. Check TSH this visit -Patient follows outpatient. No symptoms of hypothyroidism. -Continue Synthroid 50 mcg po daily   Essential Hypertension -Controlled with low diastolic pressure -Hold Losartan secondary to kidney disease for now  CKD stage III now likely progressed to Stage 4 -Stable although, baseline may be lower -Held  losartan for now -BUN/Cr went from 38/2.29 -> 33/2.15 -> 37/2.12 -C/w IVF -Avoid nephrotoxic medications -Follow up with Dr. Servando Salina as an outpatient  GERD -Stable -Continue PPI  Depression -Stable -Continue Citalopram 40 mg po Daily  Normocytic Anemia/Anemia of Chronic Kidney Disease -Likely slight Drop post Op -Hb/Hct went from 10.2/31.9 -> 9.6/30.5 -> 9.3/29.2 -> 8.9/28.1 -> 8.2/25.9 -Continue to Monitor for S/Sx of Bleeding  -Transfuse as Necessary  -Repeat CBC in AM   DVT prophylaxis: Enoxaparin 40 mg sq q24h Code Status: FULL CODE Family Communication: Discussed with Family at bedside Disposition Plan: SNF when medically stable  Consultants:   Orthopedic Surgery    Procedures:  ORIF of left supracondylar distal femur fracture on 11/6 done by Dr. Doreatha Martin   Antimicrobials: Anti-infectives (From admission, onward)   Start     Dose/Rate Route Frequency Ordered Stop   12/21/16 0600  ceFAZolin (ANCEF) IVPB 2g/100 mL premix     2 g 200 mL/hr over 30 Minutes Intravenous On call to O.R. 12/20/16 1348 12/20/16 1600   12/20/16 2100  ceFAZolin (ANCEF) IVPB 1 g/50 mL premix     1 g 100 mL/hr over 30 Minutes Intravenous Every 6 hours 12/20/16 1947 12/21/16 1012   12/20/16 1753  vancomycin (VANCOCIN) powder  Status:  Discontinued       As needed 12/20/16 1754 12/20/16 1812   12/20/16 1355  ceFAZolin (ANCEF) 2-4 GM/100ML-% IVPB    Comments:  Grace Blight   : cabinet override      12/20/16 1355 12/20/16 1530   12/19/16 0700  ceFAZolin (ANCEF) IVPB 2g/100 mL premix  Status:  Discontinued     2 g 200 mL/hr  over 30 Minutes Intravenous To [facility] 12/18/16 1946 12/19/16 1140     Subjective: Seen and examined and is Hard of Hearing. Denied any complaints and states she slept well. No nausea or vomiting. Hoping for Inpatient Rehab.  Objective: Vitals:   12/21/16 1419 12/21/16 2130 12/22/16 0553 12/22/16 1500  BP: 137/88 110/65 126/67 136/62  Pulse: 80 70 77  70  Resp: 16 18 18 16   Temp: 98.2 F (36.8 C) 97.8 F (36.6 C) 97.7 F (36.5 C) 98.3 F (36.8 C)  TempSrc: Oral Oral Oral Oral  SpO2: 98% 99% 99% 95%  Weight:      Height:        Intake/Output Summary (Last 24 hours) at 12/22/2016 1857 Last data filed at 12/22/2016 1700 Gross per 24 hour  Intake 960 ml  Output 400 ml  Net 560 ml   Filed Weights   12/21/16 1200  Weight: 90.7 kg (200 lb)   Examination: Physical Exam:  Constitutional: WN/WD obese Caucasian female in NAD appears calm and comfortable sitting in chair.  Eyes: Sclerae anicteric. Lids normal ENMT: External Ears. Nose appears normal. MMM. Patient is HOH but better when you stand close Neck: Supple with no JVD Respiratory: Clear to ausculation. Unlabored breathing and patient had no wheezing/rales/rhonchi Cardiovascular: RRR; No appreciable LE Edema Abdomen: Soft, NT, ND. Bowel sounds present.  GU: Deferred Musculoskeletal: No contractures. No cyanosis Skin: No rashes or lesions. Patient's incisions appear C/D/I Neurologic: CN 2-12 grossly intact. No appreciable focal deficits Psychiatric: Normal mood and affect. Intact judgement and insight  Data Reviewed: I have personally reviewed following labs and imaging studies  CBC: Recent Labs  Lab 12/18/16 1402 12/19/16 0541 12/20/16 1954 12/21/16 0440 12/22/16 0440  WBC 8.9 9.4 11.5* 8.0 10.5  NEUTROABS 6.2  --   --   --   --   HGB 10.2* 9.6* 9.3* 8.9* 8.2*  HCT 31.9* 30.5* 29.2* 28.1* 25.9*  MCV 98.8 100.3* 100.0 98.9 100.0  PLT 185 190 172 185 825   Basic Metabolic Panel: Recent Labs  Lab 12/18/16 1402 12/19/16 0541 12/20/16 1954 12/21/16 0440 12/22/16 0440  NA 137 136  --  138 138  K 5.0 4.5  --  4.5 4.3  CL 106 105  --  108 109  CO2 24 25  --  23 24  GLUCOSE 111* 92  --  156* 114*  BUN 40* 38*  --  33* 37*  CREATININE 2.27* 2.29* 1.95* 2.15* 2.12*  CALCIUM 9.6 9.5  --  9.3 9.6  MG  --   --   --   --  2.0  PHOS  --   --   --   --  3.5    GFR: Estimated Creatinine Clearance: 23.6 mL/min (A) (by C-G formula based on SCr of 2.12 mg/dL (H)). Liver Function Tests: Recent Labs  Lab 12/18/16 1402  AST 19  ALT 16  ALKPHOS 71  BILITOT 1.1  PROT 6.8  ALBUMIN 3.5   No results for input(s): LIPASE, AMYLASE in the last 168 hours. No results for input(s): AMMONIA in the last 168 hours. Coagulation Profile: No results for input(s): INR, PROTIME in the last 168 hours. Cardiac Enzymes: No results for input(s): CKTOTAL, CKMB, CKMBINDEX, TROPONINI in the last 168 hours. BNP (last 3 results) No results for input(s): PROBNP in the last 8760 hours. HbA1C: No results for input(s): HGBA1C in the last 72 hours. CBG: No results for input(s): GLUCAP in the last 168 hours. Lipid Profile:  No results for input(s): CHOL, HDL, LDLCALC, TRIG, CHOLHDL, LDLDIRECT in the last 72 hours. Thyroid Function Tests: No results for input(s): TSH, T4TOTAL, FREET4, T3FREE, THYROIDAB in the last 72 hours. Anemia Panel: No results for input(s): VITAMINB12, FOLATE, FERRITIN, TIBC, IRON, RETICCTPCT in the last 72 hours. Sepsis Labs: No results for input(s): PROCALCITON, LATICACIDVEN in the last 168 hours.  Recent Results (from the past 240 hour(s))  MRSA PCR Screening     Status: None   Collection Time: 12/19/16  6:50 AM  Result Value Ref Range Status   MRSA by PCR NEGATIVE NEGATIVE Final    Comment:        The GeneXpert MRSA Assay (FDA approved for NASAL specimens only), is one component of a comprehensive MRSA colonization surveillance program. It is not intended to diagnose MRSA infection nor to guide or monitor treatment for MRSA infections.   Urine culture     Status: Abnormal   Collection Time: 12/20/16 11:46 AM  Result Value Ref Range Status   Specimen Description URINE, CLEAN CATCH  Final   Special Requests NONE  Final   Culture MULTIPLE SPECIES PRESENT, SUGGEST RECOLLECTION (A)  Final   Report Status 12/21/2016 FINAL  Final   Surgical pcr screen     Status: None   Collection Time: 12/20/16  1:20 PM  Result Value Ref Range Status   MRSA, PCR NEGATIVE NEGATIVE Final   Staphylococcus aureus NEGATIVE NEGATIVE Final    Comment: (NOTE) The Xpert SA Assay (FDA approved for NASAL specimens in patients 51 years of age and older), is one component of a comprehensive surveillance program. It is not intended to diagnose infection nor to guide or monitor treatment.     Radiology Studies: Dg Knee Left Port  Result Date: 12/20/2016 CLINICAL DATA:  Postoperative left knee surgery. EXAM: PORTABLE LEFT KNEE - 1-2 VIEW COMPARISON:  12/18/2016 FINDINGS: Previous left total knee arthroplasty. Since the previous study, there has been interval lateral plate and screw fixation of comminuted periprosthetic fractures of the distal femoral metaphysis. Near-anatomic alignment and position of the fracture fragments is demonstrated with small displaced butterfly fragments remaining. Surgical hardware appears well seated. Soft tissue gas consistent with recent surgery. No dislocation. IMPRESSION: Internal plate and screw fixation of comminuted fractures of the distal left femoral metaphysis with improved alignment and position since previous study. Pre-existing left knee arthroplasty. Electronically Signed   By: Lucienne Capers M.D.   On: 12/20/2016 23:04   Scheduled Meds: . allopurinol  100 mg Oral Daily  . aspirin  81 mg Oral Daily  . calcium-vitamin D  1 tablet Oral Q breakfast  . citalopram  40 mg Oral Daily  . docusate sodium  100 mg Oral BID  . enoxaparin (LOVENOX) injection  40 mg Subcutaneous Q24H  . levothyroxine  50 mcg Oral QAC breakfast  . pantoprazole  40 mg Oral Daily  . polyethylene glycol  17 g Oral Daily  . predniSONE  5 mg Oral Q breakfast  . senna  1 tablet Oral BID   Continuous Infusions: . sodium chloride Stopped (12/21/16 1500)  . 0.9 % NaCl with KCl 20 mEq / L Stopped (12/21/16 0942)    LOS: 4 days   Kerney Elbe, DO [facility] Pager [phone]  If 7PM-7AM, please contact night-coverage [email or web address] Password [facility] 12/22/2016, 6:57 PM

## 2016-12-22 NOTE — Care Management Important Message (Signed)
Important Message  Patient Details  Name: Stacy Morris MRN: [id number] Date of Birth: Apr 21, 1933   Medicare Important Message Given:  Yes    [name] 12/22/2016, 1:02 PM

## 2016-12-22 NOTE — Progress Notes (Addendum)
Physical Therapy Treatment Patient Details Name: Stacy Morris MRN: [id number] DOB: 1933-04-06 Today's Date: 12/22/2016    History of Present Illness 83 y.o. female admitted on 12/18/16 after [place] with left distal femur fx s/p ORIF on 12/20/16.  Pt postoperatively is TDWB left leg.  She has significant PMH of Fe deficient anemia, HTN, gout, CDK III, bil TKA, and back surgery.     PT Comments    Pt is moving more on her own power today, requiring only one person assist to get OOB to the recliner chair.  Pt is also better able to maintain TDWB on her left leg today taking one pivotal hop step which makes me think that short distance gait may be possible (so I added a gait goal.  Per family MD indicated that in 2 weeks they will progress her WB status and are wondering if she would be a candidate for our intensive therapy unit.  I think she could handle then intensity level and will ask for a screen to see if she qualifies.  PT will continue to follow acutely to progress safe mobility.   Follow Up Recommendations  CIR(family would like to see if she is a candidate)     Equipment Recommendations  Rolling walker with 5" wheels    Recommendations for Other Services Rehab consult NA     Precautions / Restrictions Precautions Precautions: Fall Restrictions Weight Bearing Restrictions: Yes LLE Weight Bearing: Touchdown weight bearing    Mobility  Bed Mobility Overal bed mobility: Needs Assistance Bed Mobility: Supine to Sit       Sit to supine: Min assist   General bed mobility comments: Min assist to help support trunk to get to sitting EOB. Verbal cues for sequencing and hand placement.  Pt able to progress her left leg to EOB for the most part without assistance.   Transfers Overall transfer level: Needs assistance Equipment used: Rolling walker (2 wheeled) Transfers: Sit to/from [facility] Sit to Stand: Mod assist Stand pivot transfers: Mod assist        General transfer comment: One person mod assist with RW today, verbal cues for safe hand placement, TDWB status of left leg and technique.  Better able to maintain TDWB.   Ambulation/Gait             General Gait Details: NT yet, but pt did take a pivotal hop today, so we may be able to progress to some short distance gait if she continues to do that successfully while maintaining TDWB.        Balance Overall balance assessment: Needs assistance Sitting-balance support: Feet supported;Bilateral upper extremity supported;No upper extremity supported;Single extremity supported Sitting balance-Leahy Scale: Good     Standing balance support: Bilateral upper extremity supported Standing balance-Leahy Scale: Poor                              Cognition Arousal/Alertness: Awake/alert Behavior During Therapy: WFL for tasks assessed/performed Overall Cognitive Status: Within Functional Limits for tasks assessed                                        Exercises Total Joint Exercises Ankle Circles/Pumps: AROM;Both;20 reps Quad Sets: AROM;Both;10 reps Heel Slides: AAROM;Left;10 reps Hip ABduction/ADduction: AAROM;Left;10 reps Long Arc Quad: AROM;Left;10 reps        Pertinent  Vitals/Pain Pain Assessment: Faces Faces Pain Scale: Hurts even more Pain Location: left leg Pain Descriptors / Indicators: Aching;Burning;Grimacing;Guarding Pain Intervention(s): Limited activity within patient's tolerance;Monitored during session;Repositioned           PT Goals (current goals can now be found in the care plan section) Acute Rehab PT Goals Patient Stated Goal: to get back home and back to her independence Progress towards PT goals: Progressing toward goals    Frequency    Min 5X/week      PT Plan Discharge plan needs to be updated;Frequency needs to be updated       AM-PAC PT "6 Clicks" Daily Activity  Outcome Measure  Difficulty  turning over in bed (including adjusting bedclothes, sheets and blankets)?: Unable Difficulty moving from lying on back to sitting on the side of the bed? : Unable Difficulty sitting down on and standing up from a chair with arms (e.g., wheelchair, bedside commode, etc,.)?: Unable Help needed moving to and from a bed to chair (including a wheelchair)?: A Lot Help needed walking in hospital room?: A Lot Help needed climbing 3-5 steps with a railing? : Total 6 Click Score: 8    End of Session Equipment Utilized During Treatment: Gait belt Activity Tolerance: Patient limited by pain Patient left: in chair;with call bell/phone within reach;with family/visitor present   PT Visit Diagnosis: Muscle weakness (generalized) (M62.81);Difficulty in walking, not elsewhere classified (R26.2);Pain Pain - Right/Left: Left Pain - part of body: Leg     Time: 2595-6387 PT Time Calculation (min) (ACUTE ONLY): 17 min  Charges:  $Therapeutic Activity: 8-22 mins          Vung Kush B. Ciaran Begay, PT, DPT [phone]           12/22/2016, 10:24 AM
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
## 2016-12-22 NOTE — Social Work (Signed)
CSW f/u for OT evaluations to continue with SNF process.   Patient and family desires inpatient rehab CIR.  CSW will continue to follow.  Elissa Hefty, LCSW Clinical Social Worker [phone]

## 2016-12-22 NOTE — Evaluation (Signed)
Occupational Therapy Evaluation Patient Details Name: Stacy Morris MRN: [id number] DOB: 1933/03/18 Today's Date: 12/22/2016    History of Present Illness 83 y.o. female admitted on 12/18/16 after fall at church with left distal femur fx s/p ORIF on 12/20/16.  Pt postoperatively is TDWB left leg.  She has significant PMH of Fe deficient anemia, HTN, gout, CDK III, bil TKA, and back surgery.    Clinical Impression   Pt admitted with the above diagnoses and presents with below problem list. Pt will benefit from continued acute OT to address the below listed deficits and maximize independence with basic ADLs prior to d/c to next venue. PTA pt was mostly independent with ADLs (occasional use of cane), active in the community, drives, mows. Pt is currently max A with functional transfers and LB ADLs. Motivated to engage in therapy and eventually get back home. Family present during session. Session details below. Feel she would be able to tolerate intensive rehab program; recommending CIR.       Follow Up Recommendations  CIR    Equipment Recommendations  Other (comment)(defer to next venue)    Recommendations for Other Services Rehab consult     Precautions / Restrictions Precautions Precautions: Fall Restrictions Weight Bearing Restrictions: Yes LLE Weight Bearing: Touchdown weight bearing      Mobility Bed Mobility Overal bed mobility: Needs Assistance Bed Mobility: Supine to Sit       Sit to supine: Min assist   General bed mobility comments: up in chair upon OT arrival  Transfers Overall transfer level: Needs assistance Equipment used: Rolling walker (2 wheeled) Transfers: Sit to/from [employer] Sit to Stand: Mod assist Stand pivot transfers: Mod assist       General transfer comment: Verbal cues for technique with rw and TDWB. Successful on second attempt to stand from recliner. Mod A to powerup and to control descent.     Balance Overall balance  assessment: Needs assistance Sitting-balance support: Feet supported;Bilateral upper extremity supported;No upper extremity supported;Single extremity supported Sitting balance-Leahy Scale: Good     Standing balance support: Bilateral upper extremity supported Standing balance-Leahy Scale: Poor Standing balance comment: rw for balance                            ADL either performed or assessed with clinical judgement   ADL Overall ADL's : Needs assistance/impaired Eating/Feeding: Set up;Sitting   Grooming: Set up;Sitting   Upper Body Bathing: Set up;Sitting   Lower Body Bathing: Maximal assistance;Sit to/from stand   Upper Body Dressing : Set up;Sitting   Lower Body Dressing: Maximal assistance;Sit to/from stand   Toilet Transfer: Maximal assistance;Stand-pivot;BSC;RW   Toileting- Clothing Manipulation and Hygiene: Maximal assistance;Sit to/from stand;Cueing for compensatory techniques Toileting - Clothing Manipulation Details (indicate cue type and reason): Successful on 3rd attempt at pericare with pt standing with rw and therapist completing pericare. First and second attempts with posterior LOB back onto [facility]. Tried in seated position without success. Tub/ Shower Transfer: Maximal assistance;Tub transfer;Stand-pivot;Tub bench   Functional mobility during ADLs: (not attempted this session) General ADL Comments: Pt completed toilet transfer SPT recliner<>BSC and pericare as detailed above.      Vision         Perception     Praxis      Pertinent Vitals/Pain Pain Assessment: Faces Faces Pain Scale: Hurts even more Pain Location: left leg Pain Descriptors / Indicators: Aching;Burning;Grimacing;Guarding Pain Intervention(s): Limited activity within patient's tolerance;Monitored during  session;Repositioned     Hand Dominance Right   Extremity/Trunk Assessment Upper Extremity Assessment Upper Extremity Assessment: Generalized weakness;Overall WFL for  tasks assessed(rhematoid arthritis per pt report)   Lower Extremity Assessment Lower Extremity Assessment: Defer to PT evaluation   Cervical / Trunk Assessment Cervical / Trunk Assessment: Other exceptions Cervical / Trunk Exceptions: h/o lumbar surgery   Communication Communication Communication: HOH   Cognition Arousal/Alertness: Awake/alert Behavior During Therapy: WFL for tasks assessed/performed Overall Cognitive Status: Within Functional Limits for tasks assessed                                     General Comments  Pt's granddaughter present throughout eval.     Exercises Exercises: Total Joint Total Joint Exercises Ankle Circles/Pumps: AROM;Both;20 reps Quad Sets: AROM;Both;10 reps Heel Slides: AAROM;Left;10 reps Hip ABduction/ADduction: AAROM;Left;10 reps Long Arc Quad: AROM;Left;10 reps   Shoulder Instructions      Home Living Family/patient expects to be discharged to:: Unsure Living Arrangements: Alone Available Help at Discharge: Family;Available PRN/intermittently Type of Home: House Home Access: Stairs to enter [employer] of Steps: 2 Entrance Stairs-Rails: Right Home Layout: One level     Bathroom Shower/Tub: Teacher, early years/pre: Handicapped height     Home Equipment: Cane - single point;Bedside commode;Shower seat;Grab bars - tub/shower          Prior Functioning/Environment Level of Independence: Independent with assistive device(s)        Comments: used cane for community ambulation, otherwise mows the grass, cooks, cleans, drives and is completely independent.         OT Problem List: Decreased strength;Impaired balance (sitting and/or standing);Decreased knowledge of use of DME or AE;Decreased knowledge of precautions;Pain      OT Treatment/Interventions: Self-care/ADL training;Energy conservation;DME and/or AE instruction;Therapeutic activities;Patient/family education;Balance training     OT Goals(Current goals can be found in the care plan section) Acute Rehab OT Goals Patient Stated Goal: to get back home and back to her independence OT Goal Formulation: With patient/family Time For Goal Achievement: 01/05/17 Potential to Achieve Goals: Good ADL Goals Pt Will Perform Lower Body Bathing: with min assist Pt Will Perform Lower Body Dressing: with min assist;sit to/from stand Pt Will Transfer to Toilet: with min assist;ambulating;bedside commode Pt Will Perform Toileting - Clothing Manipulation and hygiene: with min assist;sit to/from stand Pt Will Perform Tub/Shower Transfer: with min assist;Tub transfer;ambulating;tub bench;rolling walker  OT Frequency: Min 2X/week   Barriers to D/C:            Co-evaluation              AM-PAC PT "6 Clicks" Daily Activity     Outcome Measure Help from another person eating meals?: A Little Help from another person taking care of personal grooming?: A Little Help from another person toileting, which includes using toliet, bedpan, or urinal?: A Lot Help from another person bathing (including washing, rinsing, drying)?: A Lot Help from another person to put on and taking off regular upper body clothing?: A Little Help from another person to put on and taking off regular lower body clothing?: A Lot 6 Click Score: 15   End of Session Equipment Utilized During Treatment: Gait belt;Rolling walker  Activity Tolerance: Patient tolerated treatment well Patient left: in chair;with call bell/phone within reach;with family/visitor present  OT Visit Diagnosis: Unsteadiness on feet (R26.81);Pain  Time: 9471-2527 OT Time Calculation (min): 27 min Charges:  OT General Charges $OT Visit: 1 Visit OT Evaluation $OT Eval Low Complexity: 1 Low OT Treatments $Self Care/Home Management : 8-22 mins G-Codes:       Hortencia Pilar 12/22/2016, 11:33 AM

## 2016-12-22 NOTE — Clinical Social Work Note (Signed)
Clinical Social Work Assessment  Patient Details  Name: Stacy Morris MRN: [id number] Date of Birth: 04/29/1933  Date of referral:  12/22/16               Reason for consult:  Facility Placement                Permission sought to share information with:  Case Manager Permission granted to share information::  Yes, Verbal Permission Granted  Name::        Agency::  [facility]  Relationship::     Contact Information:     Housing/Transportation Living arrangements for the past 2 months:  Single Family Home Source of Information:  Patient, Adult Children Patient Interpreter Needed:  None Criminal Activity/Legal Involvement Pertinent to Current Situation/Hospitalization:  No - Comment as needed Significant Relationships:  Adult Children, Other Family Members Lives with:  Self Do you feel safe going back to the place where you live?  No Need for family participation in patient care:  Yes (Comment)  Care giving concerns:  Pt fell at church and has new impairment. Pt reports independence with ADL's prior to hospitalization. Pt amenable to SNF.  Social Worker assessment / plan:  CSW met with patient, son and grand-daughter at bedside to discuss the SNF process and options. Family desires CIR. CSW explained that clinical team would make recommendations for in patient rehab if warranted. CSW will forward their concerns to clinical team to f/u. CSW will work on SNF placement as a back up options. Family desires [facility]. CSW will send to SNF and other, just in case that SNF does not have a bed. Family in agreement. CSW will assist with disposition going forward.   Employment status:  Retired Nurse, adult PT Recommendations:  [place] / Referral to community resources:  [place]  Patient/Family's Response to care:  Psychologist, prison and probation services of CSW assistance with SNF process. No issues or concerns  identified.  Patient/Family's Understanding of and Emotional Response to Diagnosis, Current Treatment, and Prognosis:  Patient has good understanding of diagnosis, current treatment and prognosis. Pt desires in patient rehab however would be amenable to SNF as a back up. No issues or concerns identified at this time.  Emotional Assessment Appearance:  Appears stated age Attitude/Demeanor/Rapport:  (Cooperative) Affect (typically observed):  Accepting, Appropriate Orientation:  Oriented to Situation, Oriented to  Time, Oriented to Place, Oriented to Self Alcohol / Substance use:  Not Applicable Psych involvement (Current and /or in the community):  No (Comment)  Discharge Needs  Concerns to be addressed:  Care Coordination Readmission within the last 30 days:  No Current discharge risk:  Physical Impairment, Dependent with Mobility Barriers to Discharge:  No Barriers Identified   Normajean Baxter, LCSW 12/22/2016, 10:56 AM

## 2016-12-23 ENCOUNTER — Other Ambulatory Visit: Payer: Self-pay

## 2016-12-23 ENCOUNTER — Inpatient Hospital Stay (HOSPITAL_COMMUNITY)
Admission: RE | Admit: 2016-12-23 | Discharge: 2017-01-07 | DRG: 092 | Disposition: A | Discharge status: Home Health | Payer: Medicare Other | Source: Intra-hospital | Attending: Physical Medicine & Rehabilitation | Admitting: Physical Medicine & Rehabilitation

## 2016-12-23 ENCOUNTER — Encounter (HOSPITAL_COMMUNITY): Payer: Self-pay

## 2016-12-23 DIAGNOSIS — N184 Chronic kidney disease, stage 4 (severe): Secondary | ICD-10-CM | POA: Diagnosis present

## 2016-12-23 DIAGNOSIS — M069 Rheumatoid arthritis, unspecified: Secondary | ICD-10-CM | POA: Diagnosis present

## 2016-12-23 DIAGNOSIS — W19XXXD Unspecified fall, subsequent encounter: Secondary | ICD-10-CM | POA: Diagnosis present

## 2016-12-23 DIAGNOSIS — Z96653 Presence of artificial knee joint, bilateral: Secondary | ICD-10-CM | POA: Diagnosis present

## 2016-12-23 DIAGNOSIS — Z79890 Hormone replacement therapy (postmenopausal): Secondary | ICD-10-CM

## 2016-12-23 DIAGNOSIS — M7989 Other specified soft tissue disorders: Secondary | ICD-10-CM | POA: Diagnosis not present

## 2016-12-23 DIAGNOSIS — Z8249 Family history of ischemic heart disease and other diseases of the circulatory system: Secondary | ICD-10-CM

## 2016-12-23 DIAGNOSIS — R2689 Other abnormalities of gait and mobility: Principal | ICD-10-CM | POA: Diagnosis present

## 2016-12-23 DIAGNOSIS — S72462S Displaced supracondylar fracture with intracondylar extension of lower end of left femur, sequela: Secondary | ICD-10-CM

## 2016-12-23 DIAGNOSIS — G8918 Other acute postprocedural pain: Secondary | ICD-10-CM | POA: Diagnosis not present

## 2016-12-23 DIAGNOSIS — I10 Essential (primary) hypertension: Secondary | ICD-10-CM

## 2016-12-23 DIAGNOSIS — K219 Gastro-esophageal reflux disease without esophagitis: Secondary | ICD-10-CM | POA: Diagnosis present

## 2016-12-23 DIAGNOSIS — T148XXA Other injury of unspecified body region, initial encounter: Secondary | ICD-10-CM

## 2016-12-23 DIAGNOSIS — Z7952 Long term (current) use of systemic steroids: Secondary | ICD-10-CM | POA: Diagnosis not present

## 2016-12-23 DIAGNOSIS — R8290 Unspecified abnormal findings in urine: Secondary | ICD-10-CM

## 2016-12-23 DIAGNOSIS — M109 Gout, unspecified: Secondary | ICD-10-CM | POA: Diagnosis present

## 2016-12-23 DIAGNOSIS — F329 Major depressive disorder, single episode, unspecified: Secondary | ICD-10-CM | POA: Diagnosis present

## 2016-12-23 DIAGNOSIS — M9712XD Periprosthetic fracture around internal prosthetic left knee joint, subsequent encounter: Secondary | ICD-10-CM | POA: Diagnosis not present

## 2016-12-23 DIAGNOSIS — D62 Acute posthemorrhagic anemia: Secondary | ICD-10-CM

## 2016-12-23 DIAGNOSIS — S72462A Displaced supracondylar fracture with intracondylar extension of lower end of left femur, initial encounter for closed fracture: Secondary | ICD-10-CM | POA: Diagnosis not present

## 2016-12-23 DIAGNOSIS — E039 Hypothyroidism, unspecified: Secondary | ICD-10-CM

## 2016-12-23 DIAGNOSIS — Z79899 Other long term (current) drug therapy: Secondary | ICD-10-CM | POA: Diagnosis not present

## 2016-12-23 DIAGNOSIS — D72829 Elevated white blood cell count, unspecified: Secondary | ICD-10-CM

## 2016-12-23 DIAGNOSIS — Z90710 Acquired absence of both cervix and uterus: Secondary | ICD-10-CM

## 2016-12-23 DIAGNOSIS — Z7982 Long term (current) use of aspirin: Secondary | ICD-10-CM | POA: Diagnosis not present

## 2016-12-23 DIAGNOSIS — R0989 Other specified symptoms and signs involving the circulatory and respiratory systems: Secondary | ICD-10-CM | POA: Diagnosis not present

## 2016-12-23 DIAGNOSIS — Z8739 Personal history of other diseases of the musculoskeletal system and connective tissue: Secondary | ICD-10-CM

## 2016-12-23 DIAGNOSIS — S72452D Displaced supracondylar fracture without intracondylar extension of lower end of left femur, subsequent encounter for closed fracture with routine healing: Secondary | ICD-10-CM

## 2016-12-23 DIAGNOSIS — S72492S Other fracture of lower end of left femur, sequela: Secondary | ICD-10-CM

## 2016-12-23 DIAGNOSIS — Z885 Allergy status to narcotic agent status: Secondary | ICD-10-CM

## 2016-12-23 DIAGNOSIS — K5900 Constipation, unspecified: Secondary | ICD-10-CM | POA: Diagnosis present

## 2016-12-23 DIAGNOSIS — I129 Hypertensive chronic kidney disease with stage 1 through stage 4 chronic kidney disease, or unspecified chronic kidney disease: Secondary | ICD-10-CM | POA: Diagnosis present

## 2016-12-23 DIAGNOSIS — Z974 Presence of external hearing-aid: Secondary | ICD-10-CM

## 2016-12-23 DIAGNOSIS — I150 Renovascular hypertension: Secondary | ICD-10-CM | POA: Diagnosis not present

## 2016-12-23 DIAGNOSIS — S8290XS Unspecified fracture of unspecified lower leg, sequela: Secondary | ICD-10-CM | POA: Diagnosis not present

## 2016-12-23 DIAGNOSIS — K5903 Drug induced constipation: Secondary | ICD-10-CM

## 2016-12-23 DIAGNOSIS — W19XXXA Unspecified fall, initial encounter: Secondary | ICD-10-CM

## 2016-12-23 DIAGNOSIS — W19XXXS Unspecified fall, sequela: Secondary | ICD-10-CM

## 2016-12-23 HISTORY — DX: Irritable bowel syndrome, unspecified: K58.9

## 2016-12-23 LAB — COMPREHENSIVE METABOLIC PANEL
ALBUMIN: 2.8 g/dL — AB (ref 3.5–5.0)
ALT: <5 U/L — ABNORMAL LOW (ref 14–54)
ANION GAP: 3 — AB (ref 5–15)
AST: 19 U/L (ref 15–41)
Alkaline Phosphatase: 55 U/L (ref 38–126)
BUN: 38 mg/dL — ABNORMAL HIGH (ref 6–20)
CHLORIDE: 108 mmol/L (ref 101–111)
CO2: 28 mmol/L (ref 22–32)
Calcium: 10 mg/dL (ref 8.9–10.3)
Creatinine, Ser: 2.03 mg/dL — ABNORMAL HIGH (ref 0.44–1.00)
GFR calc non Af Amer: 22 mL/min — ABNORMAL LOW (ref >60)
GFR, EST AFRICAN AMERICAN: 25 mL/min — AB (ref >60)
GLUCOSE: 105 mg/dL — AB (ref 65–99)
Potassium: 4.6 mmol/L (ref 3.5–5.1)
SODIUM: 139 mmol/L (ref 135–145)
Total Bilirubin: 0.6 mg/dL (ref 0.3–1.2)
Total Protein: 6.1 g/dL — ABNORMAL LOW (ref 6.5–8.1)

## 2016-12-23 LAB — CBC WITH DIFFERENTIAL/PLATELET
BASOS PCT: 0 %
Basophils Absolute: 0 10*3/uL (ref 0.0–0.1)
EOS ABS: 0.3 10*3/uL (ref 0.0–0.7)
EOS PCT: 3 %
HCT: 26.5 % — ABNORMAL LOW (ref 36.0–46.0)
HEMOGLOBIN: 8.3 g/dL — AB (ref 12.0–15.0)
LYMPHS ABS: 2.5 10*3/uL (ref 0.7–4.0)
Lymphocytes Relative: 29 %
MCH: 31.6 pg (ref 26.0–34.0)
MCHC: 31.3 g/dL (ref 30.0–36.0)
MCV: 100.8 fL — ABNORMAL HIGH (ref 78.0–100.0)
MONOS PCT: 14 %
Monocytes Absolute: 1.2 10*3/uL — ABNORMAL HIGH (ref 0.1–1.0)
NEUTROS PCT: 54 %
Neutro Abs: 4.5 10*3/uL (ref 1.7–7.7)
PLATELETS: 203 10*3/uL (ref 150–400)
RBC: 2.63 MIL/uL — AB (ref 3.87–5.11)
RDW: 13.9 % (ref 11.5–15.5)
WBC: 8.5 10*3/uL (ref 4.0–10.5)

## 2016-12-23 LAB — MAGNESIUM: Magnesium: 2 mg/dL (ref 1.7–2.4)

## 2016-12-23 LAB — PHOSPHORUS: Phosphorus: 2.6 mg/dL (ref 2.5–4.6)

## 2016-12-23 LAB — TSH: TSH: 2.35 u[IU]/mL (ref 0.350–4.500)

## 2016-12-23 MED ORDER — PREDNISONE 10 MG PO TABS
5.0000 mg | ORAL_TABLET | Freq: Every day | ORAL | Status: DC
  Administered 2016-12-24 – 2017-01-07 (×15): 5 mg via ORAL
  Filled 2016-12-23 (×15): qty 1

## 2016-12-23 MED ORDER — LEVOTHYROXINE SODIUM 25 MCG PO TABS
50.0000 ug | ORAL_TABLET | Freq: Every day | ORAL | Status: DC
  Administered 2016-12-24 – 2017-01-07 (×15): 50 ug via ORAL
  Filled 2016-12-23 (×17): qty 2

## 2016-12-23 MED ORDER — ALLOPURINOL 100 MG PO TABS
100.0000 mg | ORAL_TABLET | Freq: Every day | ORAL | Status: DC
  Administered 2016-12-24 – 2017-01-07 (×15): 100 mg via ORAL
  Filled 2016-12-23 (×16): qty 1

## 2016-12-23 MED ORDER — ONDANSETRON HCL 4 MG/2ML IJ SOLN
4.0000 mg | Freq: Four times a day (QID) | INTRAMUSCULAR | Status: DC | PRN
Start: 2016-12-23 — End: 2017-01-07

## 2016-12-23 MED ORDER — DOCUSATE SODIUM 100 MG PO CAPS: 100.0000 mg | ORAL_CAPSULE | Freq: Two times a day (BID) | ORAL | 0 refills | Status: DC

## 2016-12-23 MED ORDER — ENOXAPARIN SODIUM 40 MG/0.4ML ~~LOC~~ SOLN
40.0000 mg | SUBCUTANEOUS | Status: DC
  Administered 2016-12-24 – 2017-01-05 (×13): 40 mg via SUBCUTANEOUS
  Filled 2016-12-23 (×13): qty 0.4

## 2016-12-23 MED ORDER — OXYCODONE HCL 5 MG PO TABS: 15.0000 mg | ORAL_TABLET | ORAL | Status: DC | PRN

## 2016-12-23 MED ORDER — DOCUSATE SODIUM 100 MG PO CAPS
100.0000 mg | ORAL_CAPSULE | Freq: Two times a day (BID) | ORAL | Status: DC
  Administered 2016-12-23 – 2016-12-28 (×7): 100 mg via ORAL
  Filled 2016-12-23 (×12): qty 1

## 2016-12-23 MED ORDER — ENOXAPARIN SODIUM 40 MG/0.4ML ~~LOC~~ SOLN: 40.0000 mg | SUBCUTANEOUS | Status: DC

## 2016-12-23 MED ORDER — CALCIUM CARBONATE-VITAMIN D 500-200 MG-UNIT PO TABS
1.0000 | ORAL_TABLET | Freq: Every day | ORAL | Status: DC
  Administered 2016-12-24 – 2017-01-07 (×15): 1 via ORAL
  Filled 2016-12-23 (×14): qty 1

## 2016-12-23 MED ORDER — ONDANSETRON HCL 4 MG PO TABS: 4.0000 mg | ORAL_TABLET | Freq: Four times a day (QID) | ORAL | Status: DC | PRN

## 2016-12-23 MED ORDER — BISACODYL 10 MG RE SUPP
10.0000 mg | Freq: Once | RECTAL | Status: AC
  Administered 2016-12-23: 10 mg via RECTAL
  Filled 2016-12-23: qty 1

## 2016-12-23 MED ORDER — POLYETHYLENE GLYCOL 3350 17 G PO PACK
17.0000 g | PACK | Freq: Every day | ORAL | Status: DC
  Administered 2016-12-24: 17 g via ORAL
  Filled 2016-12-23 (×5): qty 1

## 2016-12-23 MED ORDER — ACETAMINOPHEN 325 MG PO TABS: 650.0000 mg | ORAL_TABLET | Freq: Four times a day (QID) | ORAL | Status: DC | PRN

## 2016-12-23 MED ORDER — ETANERCEPT 50 MG/ML ~~LOC~~ SOSY
50.0000 mg | PREFILLED_SYRINGE | SUBCUTANEOUS | Status: DC
  Administered 2016-12-24 – 2016-12-31 (×2): 50 mg via SUBCUTANEOUS
  Filled 2016-12-23 (×3): qty 0.98

## 2016-12-23 MED ORDER — BISACODYL 5 MG PO TBEC: 5.0000 mg | DELAYED_RELEASE_TABLET | Freq: Every day | ORAL | Status: DC | PRN

## 2016-12-23 MED ORDER — ACETAMINOPHEN 325 MG PO TABS
650.0000 mg | ORAL_TABLET | Freq: Four times a day (QID) | ORAL | Status: DC | PRN
  Administered 2016-12-23: 650 mg via ORAL
  Filled 2016-12-23: qty 2

## 2016-12-23 MED ORDER — POLYETHYLENE GLYCOL 3350 17 G PO PACK: 17.0000 g | PACK | Freq: Every day | ORAL | 0 refills | Status: DC | PRN

## 2016-12-23 MED ORDER — ENOXAPARIN SODIUM 40 MG/0.4ML ~~LOC~~ SOLN: 40.0000 mg | SUBCUTANEOUS | 0 refills | Status: DC

## 2016-12-23 MED ORDER — OXYCODONE HCL 15 MG PO TABS: 15.0000 mg | ORAL_TABLET | ORAL | 0 refills | Status: DC | PRN

## 2016-12-23 MED ORDER — CITALOPRAM HYDROBROMIDE 20 MG PO TABS
40.0000 mg | ORAL_TABLET | Freq: Every day | ORAL | Status: DC
  Administered 2016-12-24 – 2017-01-07 (×15): 40 mg via ORAL
  Filled 2016-12-23 (×15): qty 2

## 2016-12-23 MED ORDER — PANTOPRAZOLE SODIUM 40 MG PO TBEC
40.0000 mg | DELAYED_RELEASE_TABLET | Freq: Every day | ORAL | Status: DC
  Administered 2016-12-24 – 2017-01-07 (×15): 40 mg via ORAL
  Filled 2016-12-23 (×15): qty 1

## 2016-12-23 MED ORDER — ASPIRIN 81 MG PO CHEW
81.0000 mg | CHEWABLE_TABLET | Freq: Every day | ORAL | Status: DC
  Administered 2016-12-24 – 2017-01-07 (×15): 81 mg via ORAL
  Filled 2016-12-23 (×15): qty 1

## 2016-12-23 MED ORDER — SENNA 8.6 MG PO TABS: 1.0000 | ORAL_TABLET | Freq: Two times a day (BID) | ORAL | 0 refills | Status: DC

## 2016-12-23 MED ORDER — CHOLESTYRAMINE 4 G PO PACK
4.0000 g | PACK | ORAL | Status: DC | PRN
  Filled 2016-12-23: qty 1

## 2016-12-23 MED ORDER — ALPRAZOLAM 0.25 MG PO TABS
0.5000 mg | ORAL_TABLET | Freq: Three times a day (TID) | ORAL | Status: DC | PRN
  Administered 2016-12-28: 0.5 mg via ORAL
  Filled 2016-12-23: qty 2

## 2016-12-23 MED ORDER — SORBITOL 70 % SOLN
30.0000 mL | Freq: Every day | Status: DC | PRN
  Administered 2016-12-24: 30 mL via ORAL
  Filled 2016-12-23: qty 30

## 2016-12-23 MED ORDER — ACETAMINOPHEN 325 MG PO TABS
650.0000 mg | ORAL_TABLET | Freq: Four times a day (QID) | ORAL | Status: DC | PRN
  Administered 2016-12-25 – 2017-01-06 (×19): 650 mg via ORAL
  Filled 2016-12-23 (×19): qty 2

## 2016-12-23 NOTE — Social Work (Signed)
CSW following up on SNF as a back up if CIR cannot take patient.  CSW called Elyse Hsu at [facility] to f/u on bed offer as family would like their facility. GraceAnn indicated that the nurse has not reviewed clinicals yet and she will f/u with CSW to see if she can make a bed offer.  CSW will continue to follow.  Elissa Hefty, LCSW Clinical Social Worker [phone]

## 2016-12-23 NOTE — Progress Notes (Signed)
I met with pt and her daughter and grand daughter at bedside to discuss goals and expectations of an inpt rehab admission. They prefer an inpt rehab rather than SNF. I will begin insurance authorization with [place] [facility] and follow up with their decision today. [phone]
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
## 2016-12-23 NOTE — Progress Notes (Signed)
Patient ID: Stacy Morris, female   DOB: 1933-09-13, 83 y.o.   MRN: [id number]   LOS: 5 days   Subjective: Has some left midfoot pain that started yesterday but otherwise doing well. Hoping to go to CIR today.   Objective: Vital signs in last 24 hours: Temp:  [97.9 F (36.6 C)-98.3 F (36.8 C)] 97.9 F (36.6 C) (11/09 0443) Pulse Rate:  [70-77] 77 (11/09 0443) Resp:  [15-16] 15 (11/09 0443) BP: (136-158)/(62-65) 158/62 (11/09 0443) SpO2:  [95 %-96 %] 96 % (11/09 0443) Last BM Date: 12/18/16   Laboratory  CBC Recent Labs    12/22/16 0440 12/23/16 0336  WBC 10.5 8.5  HGB 8.2* 8.3*  HCT 25.9* 26.5*  PLT 215 203   BMET Recent Labs    12/22/16 0440 12/23/16 0336  NA 138 139  K 4.3 4.6  CL 109 108  CO2 24 28  GLUCOSE 114* 105*  BUN 37* 38*  CREATININE 2.12* 2.03*  CALCIUM 9.6 10.0     Physical Exam General appearance: alert and no distress  LLE: Incisions clean and dry, compartments soft, PF/DF/EHL intact, some pain along dorsum of foot with PF against resistance, 2+ DP, sensation intact   Assessment/Plan: Left periprosthetic femur fx s/p ORIF -- TDWB Left foot pain -- Doubt anything significant, RA exacerbation (esp given Embrel delay) most likely. No change in treatment.    Lisette Abu, PA-C Orthopedic Surgery [phone] 12/23/2016

## 2016-12-23 NOTE — Discharge Summary (Signed)
Physician Discharge Summary  Stacy Morris:[id number] DOB: 06-13-1933 DOA: 12/18/2016  PCP: Stacy Shire, MD  Admit date: 12/18/2016 Discharge date: 12/23/2016  Admitted From: Home Disposition:  CIR  Recommendations for Outpatient Follow-up:  1. Follow up with PCP in 1-2 weeks 2. Follow up with Dr. Doreatha Morris in 1-2 weeks 3. Continue with Lovenox for VTE and C/w TDWB LLE with no ROM restrictions 4. Follow up with Nephrology Dr. Marval Morris as an outpatient 5. Please obtain CMP/CBC, Mag, Phos in one week 6. Please follow up on the following pending results:  Home Health: No Equipment/Devices: Rolling Walker with 5" wheels    Discharge Condition: Stable CODE STATUS: FULL CODE Diet recommendation: Heart Healthy Diet  Brief/Interim Summary: Stacy Morris a 83 y.o.female with a history of bilateral total knee replacements, chronic kidney disease stage III (likely now progressed to Stage 4), rheumatoid arthritis, hypertension, and hypothyroidism as well as other comorbids who presented after falling in church and was found to have a left distal femur fracture. Orthopedic surgery was consulted and took the patient to the OR for ORIF of left supracondylar distal femur fracture. She is POD 3 and PT evaluated and recommended CIR today so Inpatient Rehab Physiatrist was consulted for evaluation. She was stable to D/C today and will need to follow up with PCP, Nephrology and Orthopedic Surgery as an outpatient.   Discharge Diagnoses:  Principal Problem:   Closed comminuted intra-articular fracture of distal end of left femur (Stacy Morris) Active Problems:   Rheumatoid arthritis ([place])   Essential hypertension   CKD (chronic kidney disease), stage III (HCC)   Hypothyroidism   Fall   Fracture   Acute blood loss anemia   History of gout   Constipation due to pain medication  Left Distal Femur Fracture s/p ORIF POD 3 -S/p ORIF of left supracondylar distal femur fracture. -Pain is managed  with analgesia per Ortho (Oxycodone and Tyelnol) and C/w Bowel Regimen  -Orthopedic surgery recommendations appreciated -C/w TDWB LLE with no ROM Restrictions; Per Ortho Leaving Incisions open to Air and patient may shower  -PT/OT Recommending CIR Now and has a Bed availabe -C/w Lovenox VTE Prophylaxis per Ortho -Rehab Admission Coordinator to evaluated and patient to be D/C'd to CIR today  Rheumatoid Arthritis -Stable.Patient is on chronic daily prednisone and Enbrel once a week. -Continue Prednisone 5 mg daily; Held Enbrel but ok to resume   Hypothyroidism -Last TSH of12 in 2016. Checked TSH this visit and was 2.350 -Patient follows outpatient. No symptoms of hypothyroidism. -Continue Synthroid 50 mcg po daily   Essential Hypertension -Controlled with low diastolic pressure; Held Antihypertensives but can add back slowly -HeldLosartansecondary to kidney disease but can continue at D/C and have medication adjustments per PCP and Nephrology  CKD stage III now likely progressed to Stage 4 -Stablealthough, baseline may be lower -Held losartan for now -BUN/Cr went from 38/2.29 -> 33/2.15 -> 37/2.12 -> 38/2.03 -Avoid Nephrotoxic medications; Resume Home Meds -Follow up with Dr. Servando Salina as an outpatient  GERD -Stable -Continue PPI Pantoprazole 40 mg po Daily  Depression -Stable -Continue Citalopram 40 mg po Daily  Normocytic Anemia/Anemia of Chronic Kidney Disease -Likely slight Drop post Op; Now Stable -Hb/Hct went from 10.2/31.9 -> 9.6/30.5 -> 9.3/29.2 -> 8.9/28.1 -> 8.2/25.9 -> 8.3/26.5 -Continue to Monitor for S/Sx of Bleeding  -Transfuse as Necessary  -Repeat CBC in AM   Discharge Instructions  Discharge Instructions    Call MD for:   Complete by:  As directed  Call MD for:  difficulty breathing, headache or visual disturbances   Complete by:  As directed    Call MD for:  extreme fatigue   Complete by:  As directed    Call MD for:  hives   Complete  by:  As directed    Call MD for:  persistant dizziness or light-headedness   Complete by:  As directed    Call MD for:  persistant nausea and vomiting   Complete by:  As directed    Call MD for:  redness, tenderness, or signs of infection (pain, swelling, redness, odor or green/yellow discharge around incision site)   Complete by:  As directed    Call MD for:  severe uncontrolled pain   Complete by:  As directed    Call MD for:  temperature >100.4   Complete by:  As directed    Diet - low sodium heart healthy   Complete by:  As directed    Discharge instructions   Complete by:  As directed    Follow up Care at CIR   Increase activity slowly   Complete by:  As directed      Allergies as of 12/23/2016      Reactions   Codeine Nausea And Vomiting      Medication List    TAKE these medications   acetaminophen 325 MG tablet Commonly known as:  TYLENOL Take 2 tablets (650 mg total) every 6 (six) hours as needed by mouth for mild pain.   allopurinol 100 MG tablet Commonly known as:  ZYLOPRIM Take 100 mg by mouth daily.   ALPRAZolam 0.5 MG tablet Commonly known as:  XANAX Take 0.5 mg by mouth 3 (three) times daily as needed for anxiety.   amLODipine 5 MG tablet Commonly known as:  NORVASC Take 5 mg at bedtime by mouth.   aspirin 81 MG tablet Take 81 mg by mouth daily.   atenolol 100 MG tablet Commonly known as:  TENORMIN Take 100 mg daily by mouth.   CALCIUM + D PO Take 1 tablet by mouth daily. Take 1 tablet (600 mg-200 units) daily.   cholestyramine 4 GM/DOSE powder Commonly known as:  QUESTRAN Take 4 g by mouth as needed.   citalopram 40 MG tablet Commonly known as:  CELEXA Take 40 mg by mouth daily.   docusate sodium 100 MG capsule Commonly known as:  COLACE Take 1 capsule (100 mg total) 2 (two) times daily by mouth.   enoxaparin 40 MG/0.4ML injection Commonly known as:  LOVENOX Inject 0.4 mLs (40 mg total) daily into the skin. Start taking on:   12/24/2016   etanercept 50 MG/ML injection Commonly known as:  ENBREL Inject 50 mg into the skin once a week.   levothyroxine 50 MCG tablet Commonly known as:  SYNTHROID, LEVOTHROID Take 50 mcg by mouth daily before breakfast.   loperamide 2 MG capsule Commonly known as:  IMODIUM Take 4 mg by mouth as needed for diarrhea or loose stools.   losartan 100 MG tablet Commonly known as:  COZAAR Take 100 mg at bedtime by mouth.   omeprazole 20 MG capsule Commonly known as:  PRILOSEC Take 20 mg by mouth daily.   oxyCODONE 15 MG immediate release tablet Commonly known as:  ROXICODONE Take 1 tablet (15 mg total) every 4 (four) hours as needed by mouth for moderate pain.   polyethylene glycol packet Commonly known as:  MIRALAX / GLYCOLAX Take 17 g daily as needed by mouth for  mild constipation.   predniSONE 5 MG tablet Commonly known as:  DELTASONE Take 5 mg by mouth daily with breakfast.   senna 8.6 MG Tabs tablet Commonly known as:  SENOKOT Take 1 tablet (8.6 mg total) 2 (two) times daily by mouth.      Contact information for after-discharge care    Destination    [facility] .   Service:  Skilled Nursing Contact information: [address] [place] [phone]             Allergies  Allergen Reactions  . Codeine Nausea And Vomiting   Consultations:  Orthopedic Surgery  CIR  Procedures/Studies: Dg Chest 1 View  Result Date: 12/18/2016 CLINICAL DATA:  Fall EXAM: CHEST 1 VIEW COMPARISON:  04/09/2014 FINDINGS: Lungs are under aerated. Bibasilar atelectasis. Stable interstitial prominence. Mild cardiomegaly. No pneumothorax. IMPRESSION: Cardiomegaly without decompensation. Low volumes and bibasilar atelectasis. Electronically Signed   By: [name] M.D.   On: 12/18/2016 13:38   Ct Knee Left Wo Contrast  Result Date: 12/18/2016 CLINICAL DATA:  Fall, landing on the left knee, with distal femur fracture. EXAM: CT  OF THE left KNEE WITHOUT CONTRAST TECHNIQUE: Multidetector CT imaging of the left knee was performed according to the standard protocol. Multiplanar CT image reconstructions were also generated. COMPARISON:  Radiographs from 12/18/2016 FINDINGS: Streak artifact from the total knee prosthesis obscures bony detail. Bones/Joint/Cartilage Periprosthetic fracture of the distal femur noted, with a oblique fracture of the distal femoral metadiaphysis noted extending to the loop of the anterior margin of the femoral component of the total knee prosthesis. The distal fragment is displaced 1.3 cm posteriorly, 2.4 cm laterally, and overlapped by 1.0 cm. In addition, there is some mild comminution including a 2.0 by 1.3 cm cortical fragment imbedded along the distal fracture plane margin to the proximal fragment, a 1.6 by 0.7 cm fragment along the posterolateral margin of the dominant distal fracture fragment, and a slightly longitudinal nondisplaced fracture of the shaft of the femur extending proximally from the dominant fracture site medially, becoming indistinct about 6 cm proximal to the dominant oblique fracture site. I do not observe the patella, tibia, or fibula to be fractured in their visualized portions. Overall the articulation of the distal fragment with the tibia appears normal. Ligaments Suboptimally assessed by CT. Muscles and Tendons As expected, there is abnormal edema and some hematoma along the regional musculature and tissue planes. Detail obscured by streak artifact. There is some thickening of the distal quadriceps tendon which could be chronic. Soft tissues Mild subcutaneous edema as expected. IMPRESSION: 1. Displaced and mildly overlapped periprosthetic fracture of the distal femur. Mild comminution as detailed above. No fracture of the patella, proximal tibia, or proximal fibula identified. The distal femur fracture fragment and the tibia appear to retain expected alignment. 2. There is some thickening  of the distal quadriceps tendon, uncertain chronicity. Electronically Signed   By: Van Clines M.D.   On: 12/18/2016 15:30   Dg Knee Complete 4 Views Left  Result Date: 12/18/2016 CLINICAL DATA:  Tripped and fall at church. Knee pain. Initial encounter. EXAM: LEFT KNEE - COMPLETE 4+ VIEW COMPARISON:  None. FINDINGS: A comminuted fracture of the distal fibula is present just above the femoral component of the total knee arthroplasty. The patella is intact. Tibia is intact. Mild osteopenia is noted. Greater than 40 degrees angulation is present. Fracture is displaced posteriorly and laterally. IMPRESSION: 1. Comminuted fracture of the distal femur with  severe ventral angulation. The fracture is just above the femoral component of the total knee arthroplasty. Electronically Signed   By: San Morelle M.D.   On: 12/18/2016 13:45   Dg Knee Left Port  Result Date: 12/20/2016 CLINICAL DATA:  Postoperative left knee surgery. EXAM: PORTABLE LEFT KNEE - 1-2 VIEW COMPARISON:  12/18/2016 FINDINGS: Previous left total knee arthroplasty. Since the previous study, there has been interval lateral plate and screw fixation of comminuted periprosthetic fractures of the distal femoral metaphysis. Near-anatomic alignment and position of the fracture fragments is demonstrated with small displaced butterfly fragments remaining. Surgical hardware appears well seated. Soft tissue gas consistent with recent surgery. No dislocation. IMPRESSION: Internal plate and screw fixation of comminuted fractures of the distal left femoral metaphysis with improved alignment and position since previous study. Pre-existing left knee arthroplasty. Electronically Signed   By: Lucienne Capers M.D.   On: 12/20/2016 23:04   Dg C-arm 61-120 Min  Result Date: 12/20/2016 CLINICAL DATA:  Left distal femoral fracture open-reduction internal-fixation. EXAM: DG C-ARM 61-120 MIN; LEFT FEMUR 2 VIEWS COMPARISON:  12/18/2016 FINDINGS:  Intraoperative fluoroscopic images from open-reduction internal-fixation of distal left femoral fracture demonstrate placement of sideplate and screws across the distal left femur affixing the previously severely displaced distal femoral fracture. The alignment is near anatomic. Stable appearance of pre-existing total left hip arthroplasty hardware. Fluoroscopy time is recorded as 2 minutes 55 seconds. IMPRESSION: Improved alignment post sideplate and screw fixation of distal left femoral fracture. Electronically Signed   By: Fidela Salisbury M.D.   On: 12/20/2016 19:37   Dg Femur Min 2 Views Left  Result Date: 12/20/2016 CLINICAL DATA:  Left distal femoral fracture open-reduction internal-fixation. EXAM: DG C-ARM 61-120 MIN; LEFT FEMUR 2 VIEWS COMPARISON:  12/18/2016 FINDINGS: Intraoperative fluoroscopic images from open-reduction internal-fixation of distal left femoral fracture demonstrate placement of sideplate and screws across the distal left femur affixing the previously severely displaced distal femoral fracture. The alignment is near anatomic. Stable appearance of pre-existing total left hip arthroplasty hardware. Fluoroscopy time is recorded as 2 minutes 55 seconds. IMPRESSION: Improved alignment post sideplate and screw fixation of distal left femoral fracture. Electronically Signed   By: Fidela Salisbury M.D.   On: 12/20/2016 19:37   Dg Femur Min 2 Views Left  Result Date: 12/18/2016 CLINICAL DATA:  Fall at church.  Left knee pain.  Initial encounter. EXAM: LEFT FEMUR 2 VIEWS COMPARISON:  Knee radiographs of the same day. FINDINGS: A comminuted fracture of the distal femur air is present just above the femoral component of the total knee arthroplasty. Fracture is displaced posteriorly and laterally with greater than 40 degrees ventral angulation. IMPRESSION: Comminuted distal femur fracture just above the femoral component of the total knee arthroplasty with posterolateral displacement and  severe angulation. Electronically Signed   By: San Morelle M.D.   On: 12/18/2016 13:46    Subjective: Seen and examined and was doing well. No complaints states foot hurt a little but slept well. No Nausea or Vomiting. Ready to go to Rehab.  Discharge Exam: Vitals:   12/23/16 0443 12/23/16 1456  BP: (!) 158/62 (!) 139/57  Pulse: 77 70  Resp: 15 16  Temp: 97.9 F (36.6 C) 98.2 F (36.8 C)  SpO2: 96% 95%   Vitals:   12/22/16 1500 12/22/16 2037 12/23/16 0443 12/23/16 1456  BP: 136/62 (!) 154/65 (!) 158/62 (!) 139/57  Pulse: 70 76 77 70  Resp: 16 16 15 16   Temp: 98.3 F (36.8 C) 98  F (36.7 C) 97.9 F (36.6 C) 98.2 F (36.8 C)  TempSrc: Oral Oral Oral Oral  SpO2: 95% 95% 96% 95%  Weight:      Height:       General: Pt is alert, awake, not in acute distress Cardiovascular: RRR, S1/S2 +, no rubs, no gallops Respiratory: CTA bilaterally, no wheezing, no rhonchi Abdominal: Soft, NT, ND, bowel sounds + Extremities: no edema, no cyanosis; Left sided incisions appear C/D/I  The results of significant diagnostics from this hospitalization (including imaging, microbiology, ancillary and laboratory) are listed below for reference.    Microbiology: Recent Results (from the past 240 hour(s))  MRSA PCR Screening     Status: None   Collection Time: 12/19/16  6:50 AM  Result Value Ref Range Status   MRSA by PCR NEGATIVE NEGATIVE Final    Comment:        The GeneXpert MRSA Assay (FDA approved for NASAL specimens only), is one component of a comprehensive MRSA colonization surveillance program. It is not intended to diagnose MRSA infection nor to guide or monitor treatment for MRSA infections.   Urine culture     Status: Abnormal   Collection Time: 12/20/16 11:46 AM  Result Value Ref Range Status   Specimen Description URINE, CLEAN CATCH  Final   Special Requests NONE  Final   Culture MULTIPLE SPECIES PRESENT, SUGGEST RECOLLECTION (A)  Final   Report Status  12/21/2016 FINAL  Final  Surgical pcr screen     Status: None   Collection Time: 12/20/16  1:20 PM  Result Value Ref Range Status   MRSA, PCR NEGATIVE NEGATIVE Final   Staphylococcus aureus NEGATIVE NEGATIVE Final    Comment: (NOTE) The Xpert SA Assay (FDA approved for NASAL specimens in patients 21 years of age and older), is one component of a comprehensive surveillance program. It is not intended to diagnose infection nor to guide or monitor treatment.     Labs: BNP (last 3 results) No results for input(s): BNP in the last 8760 hours. Basic Metabolic Panel: Recent Labs  Lab 12/18/16 1402 12/19/16 0541 12/20/16 1954 12/21/16 0440 12/22/16 0440 12/23/16 0336  NA 137 136  --  138 138 139  K 5.0 4.5  --  4.5 4.3 4.6  CL 106 105  --  108 109 108  CO2 24 25  --  23 24 28   GLUCOSE 111* 92  --  156* 114* 105*  BUN 40* 38*  --  33* 37* 38*  CREATININE 2.27* 2.29* 1.95* 2.15* 2.12* 2.03*  CALCIUM 9.6 9.5  --  9.3 9.6 10.0  MG  --   --   --   --  2.0 2.0  PHOS  --   --   --   --  3.5 2.6   Liver Function Tests: Recent Labs  Lab 12/18/16 1402 12/23/16 0336  AST 19 19  ALT 16 <5*  ALKPHOS 71 55  BILITOT 1.1 0.6  PROT 6.8 6.1*  ALBUMIN 3.5 2.8*   No results for input(s): LIPASE, AMYLASE in the last 168 hours. No results for input(s): AMMONIA in the last 168 hours. CBC: Recent Labs  Lab 12/18/16 1402 12/19/16 0541 12/20/16 1954 12/21/16 0440 12/22/16 0440 12/23/16 0336  WBC 8.9 9.4 11.5* 8.0 10.5 8.5  NEUTROABS 6.2  --   --   --   --  4.5  HGB 10.2* 9.6* 9.3* 8.9* 8.2* 8.3*  HCT 31.9* 30.5* 29.2* 28.1* 25.9* 26.5*  MCV 98.8 100.3* 100.0 98.9  100.0 100.8*  PLT 185 190 172 185 215 203   Cardiac Enzymes: No results for input(s): CKTOTAL, CKMB, CKMBINDEX, TROPONINI in the last 168 hours. BNP: Invalid input(s): POCBNP CBG: No results for input(s): GLUCAP in the last 168 hours. D-Dimer No results for input(s): DDIMER in the last 72 hours. Hgb A1c No results  for input(s): HGBA1C in the last 72 hours. Lipid Profile No results for input(s): CHOL, HDL, LDLCALC, TRIG, CHOLHDL, LDLDIRECT in the last 72 hours. Thyroid function studies Recent Labs    12/23/16 0336  TSH 2.350   Anemia work up No results for input(s): VITAMINB12, FOLATE, FERRITIN, TIBC, IRON, RETICCTPCT in the last 72 hours. Urinalysis    Component Value Date/Time   COLORURINE YELLOW 04/09/2014 2030   APPEARANCEUR CLEAR 04/09/2014 2030   LABSPEC 1.017 04/09/2014 2030   PHURINE 5.5 04/09/2014 2030   GLUCOSEU NEGATIVE 04/09/2014 2030   [place] NEGATIVE 04/09/2014 2030   [place] NEGATIVE 04/09/2014 2030   [place] 04/09/2014 2030   PROTEINUR NEGATIVE 04/09/2014 2030   UROBILINOGEN 0.2 04/09/2014 2030   NITRITE NEGATIVE 04/09/2014 2030   LEUKOCYTESUR SMALL (A) 04/09/2014 2030   Sepsis Labs Invalid input(s): PROCALCITONIN,  WBC,  LACTICIDVEN Microbiology Recent Results (from the past 240 hour(s))  MRSA PCR Screening     Status: None   Collection Time: 12/19/16  6:50 AM  Result Value Ref Range Status   MRSA by PCR NEGATIVE NEGATIVE Final    Comment:        The GeneXpert MRSA Assay (FDA approved for NASAL specimens only), is one component of a comprehensive MRSA colonization surveillance program. It is not intended to diagnose MRSA infection nor to guide or monitor treatment for MRSA infections.   Urine culture     Status: Abnormal   Collection Time: 12/20/16 11:46 AM  Result Value Ref Range Status   Specimen Description URINE, CLEAN CATCH  Final   Special Requests NONE  Final   Culture MULTIPLE SPECIES PRESENT, SUGGEST RECOLLECTION (A)  Final   Report Status 12/21/2016 FINAL  Final  Surgical pcr screen     Status: None   Collection Time: 12/20/16  1:20 PM  Result Value Ref Range Status   MRSA, PCR NEGATIVE NEGATIVE Final   Staphylococcus aureus NEGATIVE NEGATIVE Final    Comment: (NOTE) The Xpert SA Assay (FDA approved for NASAL specimens in  patients 67 years of age and older), is one component of a comprehensive surveillance program. It is not intended to diagnose infection nor to guide or monitor treatment.    Time coordinating discharge: 35 minutes  SIGNED:  Kerney Elbe, DO [facility] 12/23/2016, 3:30 PM Pager [phone]  If 7PM-7AM, please contact night-coverage [email or web address] Password [email or web address]

## 2016-12-23 NOTE — Care Management Note (Signed)
Case Management Note  Patient Details  Name: GAVIN FAIVRE MRN: [id number] Date of Birth: 30-Jul-1933  Subjective/Objective:                 DC to CIR. CM signing off   Action/Plan:   Expected Discharge Date:  12/24/16               Expected Discharge Plan:  [place]  In-House Referral:  Clinical Social Work  Discharge planning Services  CM Consult  Post Acute Care Choice:    Choice offered to:     DME Arranged:    DME Agency:     HH Arranged:    [place] Agency:     Status of Service:  Completed, signed off  If discussed at [employer], dates discussed:    Additional Comments:  Carles Collet, RN 12/23/2016, 3:09 PM

## 2016-12-23 NOTE — Progress Notes (Signed)
Patient and family were informed about rehab process including patient safety plan and rehab booklet. 
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
## 2016-12-23 NOTE — Progress Notes (Signed)
Occupational Therapy Treatment Patient Details Name: Stacy Morris MRN: [id number] DOB: Jan 10, 1934 Today's Date: 12/23/2016    History of present illness 83 y.o. female admitted on 12/18/16 after fall at church with left distal femur fx s/p ORIF on 12/20/16.  Pt postoperatively is TDWB left leg.  She has significant PMH of Fe deficient anemia, HTN, gout, CDK III, bil TKA, and back surgery.    OT comments  Pt progressing towards acute OT goals. Focus of session was functional transfers and ADL education. Pt tolerated session very well. D/c plan still appropriate.   Follow Up Recommendations  CIR    Equipment Recommendations  Other (comment)    Recommendations for Other Services Rehab consult    Precautions / Restrictions Precautions Precautions: Fall Restrictions Weight Bearing Restrictions: Yes LLE Weight Bearing: Touchdown weight bearing       Mobility Bed Mobility Overal bed mobility: Needs Assistance Bed Mobility: Supine to Sit     Supine to sit: HOB elevated;Min assist        Transfers Overall transfer level: Needs assistance Equipment used: Rolling walker (2 wheeled) Transfers: Sit to/from [facility] Sit to Stand: Mod assist Stand pivot transfers: Mod assist       General transfer comment: improved balance during transfer compared to last session.    Balance Overall balance assessment: Needs assistance         Standing balance support: Bilateral upper extremity supported Standing balance-Leahy Scale: Poor Standing balance comment: rw for balance                            ADL either performed or assessed with clinical judgement   ADL Overall ADL's : Needs assistance/impaired                                       General ADL Comments: Pt completed bed mobility and SPT EOB>recliner with mod A. Reviewed ADL education.      Vision       Perception     Praxis      Cognition Arousal/Alertness:  Awake/alert Behavior During Therapy: WFL for tasks assessed/performed Overall Cognitive Status: Within Functional Limits for tasks assessed                                          Exercises     Shoulder Instructions       General Comments      Pertinent Vitals/ Pain       Pain Assessment: Faces Faces Pain Scale: Hurts little more Pain Location: left leg Pain Descriptors / Indicators: Aching;Burning;Grimacing;Guarding Pain Intervention(s): Limited activity within patient's tolerance;Monitored during session;Repositioned  Home Living                                          Prior Functioning/Environment              Frequency  Min 2X/week        Progress Toward Goals  OT Goals(current goals can now be found in the care plan section)  Progress towards OT goals: Progressing toward goals  Acute Rehab OT Goals Patient Stated Goal: to get back home and  back to her independence OT Goal Formulation: With patient/family Time For Goal Achievement: 01/05/17 Potential to Achieve Goals: Good ADL Goals Pt Will Perform Lower Body Bathing: with min assist Pt Will Perform Lower Body Dressing: with min assist;sit to/from stand Pt Will Transfer to Toilet: with min assist;ambulating;bedside commode Pt Will Perform Toileting - Clothing Manipulation and hygiene: with min assist;sit to/from stand Pt Will Perform Tub/Shower Transfer: with min assist;Tub transfer;ambulating;tub bench;rolling walker  Plan Discharge plan remains appropriate    Co-evaluation                 AM-PAC PT "6 Clicks" Daily Activity     Outcome Measure   Help from another person eating meals?: A Little Help from another person taking care of personal grooming?: A Little Help from another person toileting, which includes using toliet, bedpan, or urinal?: A Lot Help from another person bathing (including washing, rinsing, drying)?: A Lot Help from another person  to put on and taking off regular upper body clothing?: A Little Help from another person to put on and taking off regular lower body clothing?: A Lot 6 Click Score: 15    End of Session Equipment Utilized During Treatment: Gait belt;Rolling walker  OT Visit Diagnosis: Unsteadiness on feet (R26.81);Pain   Activity Tolerance Patient tolerated treatment well   Patient Left in chair;with call bell/phone within reach;with family/visitor present   Nurse Communication          Time: 1135-1200 OT Time Calculation (min): 25 min  Charges: OT General Charges $OT Visit: 1 Visit OT Treatments $Self Care/Home Management : 23-37 mins     Hortencia Pilar 12/23/2016, 1:44 PM

## 2016-12-23 NOTE — Progress Notes (Signed)
I have insurance approval and bed available to admit pt tot day. I notified Dr. Alfredia Ferguson as well as SW. I will make the arrangements to admit today. [phone]

## 2016-12-23 NOTE — Plan of Care (Signed)
  Pain Managment: General experience of comfort will improve 12/23/2016 0118 - Progressing by Janee Morn, RN

## 2016-12-24 ENCOUNTER — Encounter (HOSPITAL_COMMUNITY): Payer: Self-pay

## 2016-12-24 ENCOUNTER — Inpatient Hospital Stay (HOSPITAL_COMMUNITY): Payer: Medicare Other | Admitting: Occupational Therapy

## 2016-12-24 ENCOUNTER — Inpatient Hospital Stay (HOSPITAL_COMMUNITY): Payer: Medicare Other | Admitting: Physical Therapy
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
## 2016-12-24 NOTE — Plan of Care (Signed)
LTGs filed 12/24/16
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
## 2016-12-25 ENCOUNTER — Inpatient Hospital Stay (HOSPITAL_COMMUNITY): Payer: Medicare Other

## 2016-12-25 DIAGNOSIS — S72462A Displaced supracondylar fracture with intracondylar extension of lower end of left femur, initial encounter for closed fracture: Secondary | ICD-10-CM

## 2016-12-25 DIAGNOSIS — M7989 Other specified soft tissue disorders: Secondary | ICD-10-CM

## 2016-12-25 DIAGNOSIS — I10 Essential (primary) hypertension: Secondary | ICD-10-CM

## 2016-12-25 NOTE — Progress Notes (Signed)
Physical Therapy Session Note  Patient Details  Name: Stacy Morris MRN: [id number] Date of Birth: 1933/12/12  Today's Date: 12/25/2016 PT Individual Time: 0076-2263 PT Individual Time Calculation (min): 43 min   Short Term Goals: Week 1:  PT Short Term Goal 1 (Week 1): Pt will perform stand pivot transfers with min assist and LRAD PT Short Term Goal 2 (Week 1): Pt will ambulate x 50 ft with LRAD and maintaining TTWB on L LE with min assist PT Short Term Goal 3 (Week 1): Pt will perform bed mobility with supervision  Skilled Therapeutic Interventions/Progress Updates:    Pt supine in bed upon PT arrival, agreeable to therapy tx and denies pain. Pt transferred from supine to sitting EOB with min assist. Pt performed squat pivot transfer from bed>w/c with mod assist, verbal and tactile cues to maintain TTWB on L LE. Pt performed sit<>stand with min assist using RW, verbal cues for technique. Pt reported needing to use the bathroom before going to the therapy gym. Pt performed stand pivot transfer with RW and mod assist from w/c<>commode over toilet, verbal cues for pivoting technique on R LE with keeping TTWB on L LE. Pt performed toileting with min assist for clothing management and to clean peri area. Pt transported to gym in w/c. Pt performed sit<>stands x 5 throughout session mod assist fading to min assist, verbal cues for precautions. Pt performed standing therex using L LE while standing on R LE: 2 x 10 hip abduction, 2 x 10 hip extension, 2 x 10 hip flexion , 2 x 10 hamstring curls. Pt performed seated therex: 2 x 10 LAQ and 2 x 10 seated marches. Pt left seated in w/c at end of session with needs in reach.  Therapy Documentation Precautions:  Precautions Precautions: Fall Restrictions Weight Bearing Restrictions: Yes LLE Weight Bearing: Touchdown weight bearing General:   See Function Navigator for Current Functional Status.   Therapy/Group: Individual Therapy  Netta Corrigan, PT, DPT 12/25/2016, 4:42 PM

## 2016-12-25 NOTE — Progress Notes (Signed)
Subjective/Complaints: Patient states that she was on for blood pressure medications at home that her top number is running high.  She had problem managing at home as well.  Review of systems denies chest pain shortness of breath nausea vomiting diarrhea or constipation.  Objective: Vital Signs: Blood pressure (!) 167/55, pulse 63, temperature 97.7 F (36.5 C), temperature source Oral, resp. rate 16, height _0  (1.702 m), weight 104 kg (229 lb 4.5 oz), SpO2 97 %. No results found. Results for orders placed or performed during the hospital encounter of 12/18/16 (from the past 72 hour(s))  TSH     Status: None   Collection Time: 12/23/16  3:36 AM  Result Value Ref Range   TSH 2.350 0.350 - 4.500 uIU/mL    Comment: Performed by a 3rd Generation assay with a functional sensitivity of <=0.01 uIU/mL.  CBC with Differential/Platelet     Status: Abnormal   Collection Time: 12/23/16  3:36 AM  Result Value Ref Range   WBC 8.5 4.0 - 10.5 K/uL   RBC 2.63 (L) 3.87 - 5.11 MIL/uL   Hemoglobin 8.3 (L) 12.0 - 15.0 g/dL   HCT 26.5 (L) 36.0 - 46.0 %   MCV 100.8 (H) 78.0 - 100.0 fL   MCH 31.6 26.0 - 34.0 pg   MCHC 31.3 30.0 - 36.0 g/dL   RDW 13.9 11.5 - 15.5 %   Platelets 203 150 - 400 K/uL   Neutrophils Relative % 54 %   Neutro Abs 4.5 1.7 - 7.7 K/uL   Lymphocytes Relative 29 %   Lymphs Abs 2.5 0.7 - 4.0 K/uL   Monocytes Relative 14 %   Monocytes Absolute 1.2 (H) 0.1 - 1.0 K/uL   Eosinophils Relative 3 %   Eosinophils Absolute 0.3 0.0 - 0.7 K/uL   Basophils Relative 0 %   Basophils Absolute 0.0 0.0 - 0.1 K/uL  Comprehensive metabolic panel     Status: Abnormal   Collection Time: 12/23/16  3:36 AM  Result Value Ref Range   Sodium 139 135 - 145 mmol/L   Potassium 4.6 3.5 - 5.1 mmol/L   Chloride 108 101 - 111 mmol/L   CO2 28 22 - 32 mmol/L   Glucose, Bld 105 (H) 65 - 99 mg/dL   BUN 38 (H) 6 - 20 mg/dL   Creatinine, Ser 2.03 (H) 0.44 - 1.00 mg/dL   Calcium 10.0 8.9 - 10.3 mg/dL   Total  Protein 6.1 (L) 6.5 - 8.1 g/dL   Albumin 2.8 (L) 3.5 - 5.0 g/dL   AST 19 15 - 41 U/L   ALT <5 (L) 14 - 54 U/L   Alkaline Phosphatase 55 38 - 126 U/L   Total Bilirubin 0.6 0.3 - 1.2 mg/dL   GFR calc non Af Amer 22 (L) >60 mL/min   GFR calc Af Amer 25 (L) >60 mL/min    Comment: (NOTE) The eGFR has been calculated using the CKD EPI equation. This calculation has not been validated in all clinical situations. eGFR's persistently <60 mL/min signify possible Chronic Kidney Disease.    Anion gap 3 (L) 5 - 15  Magnesium     Status: None   Collection Time: 12/23/16  3:36 AM  Result Value Ref Range   Magnesium 2.0 1.7 - 2.4 mg/dL  Phosphorus     Status: None   Collection Time: 12/23/16  3:36 AM  Result Value Ref Range   Phosphorus 2.6 2.5 - 4.6 mg/dL      General: No acute  distress Mood and affect are appropriate Heart: Regular rate and rhythm no rubs murmurs or extra sounds Lungs: Clear to auscultation, breathing unlabored, no rales or wheezes Abdomen: Positive bowel sounds, soft nontender to palpation, nondistended Extremities: No clubbing, cyanosis, or edema Skin: No evidence of breakdown, no evidence of rash Neurologic: Cranial nerves II through XII intact, motor strength is 5/5 in bilateral deltoid, bicep, tricep, grip, hip flexor, knee extensors, ankle dorsiflexor and plantar flexor Sensory exam normal sensation to light touch and proprioception in bilateral upper and lower extremities Cerebellar exam normal finger to nose to finger as well as heel to shin in bilateral upper and lower extremities Musculoskeletal: Left knee lateral incision healing well , dermabond, reduce L knee ROM   Assessment/Plan: 1. Functional deficits secondary to left periprosthetic distal femur fracture which require 3+ hours per day of interdisciplinary therapy in a comprehensive inpatient rehab setting. Physiatrist is providing close team supervision and 24 hour management of active medical problems  listed below. Physiatrist and rehab team continue to assess barriers to discharge/monitor patient progress toward functional and medical goals. FIM: Function - Bathing Position: Wheelchair/chair at sink Body parts bathed by patient: Right arm, Left arm, Chest, Abdomen, Front perineal area, Right upper leg, Left upper leg Body parts bathed by helper: Buttocks, Right lower leg, Left lower leg, Back Assist Level: (Mod A)  Function- Upper Body Dressing/Undressing What is the patient wearing?: Bra, Pull over shirt/dress Bra - Perfomed by patient: Thread/unthread right bra strap, Thread/unthread left bra strap, Hook/unhook bra (pull down sports bra) Pull over shirt/dress - Perfomed by patient: Thread/unthread right sleeve, Thread/unthread left sleeve, Put head through opening, Pull shirt over trunk Function - Lower Body Dressing/Undressing What is the patient wearing?: Pants, Underwear, Non-skid slipper socks, Ted Hose Position: Wheelchair/chair at [employer] - Performed by patient: Thread/unthread right underwear leg, Thread/unthread left underwear leg Underwear - Performed by helper: Pull underwear up/down Pants- Performed by patient: Thread/unthread right pants leg, Thread/unthread left pants leg Pants- Performed by helper: Pull pants up/down Non-skid slipper socks- Performed by helper: Don/doff right sock, Don/doff left sock TED Hose - Performed by helper: Don/doff right TED hose, Don/doff left TED hose Assist for footwear: Dependant Assist for lower body dressing: (Mod A)  Function - Toileting Toileting activity did not occur: No continent bowel/bladder event Toileting steps completed by patient: Adjust clothing prior to toileting Toileting steps completed by helper: Adjust clothing prior to toileting, Performs perineal hygiene, Adjust clothing after toileting Toileting Assistive Devices: Grab bar or rail Assist level: Touching or steadying assistance (Pt.75%)  Function - Engineer, petroleum transfer activity did not occur: Safety/medical concerns Toilet transfer assistive device: Bedside commode, Drop arm commode Assist level to toilet: Maximal assist (Pt 25 - 49%/lift and lower) Assist level from toilet: Maximal assist (Pt 25 - 49%/lift and lower) Assist level to bedside commode (at bedside): Maximal assist (Pt 25 - 49%/lift and lower) Assist level from bedside commode (at bedside): Maximal assist (Pt 25 - 49%/lift and lower)  Function - Chair/bed transfer Chair/bed transfer method: Stand pivot Chair/bed transfer assist level: Moderate assist (Pt 50 - 74%/lift or lower) Chair/bed transfer assistive device: Armrests, Walker  Function - Locomotion: Wheelchair Type: Manual Max wheelchair distance: 184f  Assist Level: Touching or steadying assistance (Pt > 75%) Assist Level: Touching or steadying assistance (Pt > 75%) Wheel 150 feet activity did not occur: Safety/medical concerns Turns around,maneuvers to table,bed, and toilet,negotiates 3% grade,maneuvers on rugs and over doorsills: No Function - Locomotion: Ambulation  Assistive device: Walker-rolling Max distance: 40f  Assist level: Moderate assist (Pt 50 - 74%) Assist level: Moderate assist (Pt 50 - 74%) Walk 50 feet with 2 turns activity did not occur: Safety/medical concerns Walk 150 feet activity did not occur: Safety/medical concerns  Function - Comprehension Comprehension: Auditory Comprehension assist level: Set up assist with hearing device  Function - Expression Expression: Verbal Expression assist level: Expresses basic needs/ideas: With extra time/assistive device  Function - Social Interaction Social Interaction assist level: Interacts appropriately with others with medication or extra time (anti-anxiety, antidepressant).  Function - Problem Solving Problem solving assist level: Solves basic 90% of the time/requires cueing < 10% of the time  Function - Memory Memory assist level:  More than reasonable amount of time Patient normally able to recall (first 3 days only): Current season, That he or she is in a hospital, Location of own room   Medical Problem List and Plan: 1.  Decreased functional mobility with gait disorder and impairment of ADLs secondary to left supracondylar fracture of the femur s/p ORIF 12/20/2016. Touchdown weightbearing CIR PT, OT 2.  DVT Prophylaxis/Anticoagulation: Subcutaneous Lovenox. Check vascular study 3. Pain Management: Oxycodone as needed 4. Mood: Celexa 40 mg daily, Xanax 0.5 mg 3 times a day as needed 5. Neuropsych: This patient is capable of making decisions on her own behalf. 6. Skin/Wound Care: Routine skin checks 7. Fluids/Electrolytes/Nutrition: Routine I&O's with follow-up chemistries 8. Acute blood loss anemia. Follow-up CBC 9. CKD stage III. Follow-up chemistries, creat stable recheck in am 10. Hypothyroidism. Continue Synthroid 11. Rheumatoid arthritis. Continue chronic prednisone 5 mg daily. Patient receiving Enbrel 50 mg weekly 12. History of gout. Patient receiving allopurinol daily 13. Constipation. Laxative assistance 14. Hx HTN ,on 4 meds at home, norvasc cozaar and tenormin are listed Will restart norvasc 542m11/11 Vitals:   12/24/16 1500 12/25/16 0455  BP: 140/62 (!) 167/55  Pulse: 73 63  Resp: 18 16  Temp: 98.6 F (37 C) 97.7 F (36.5 C)  SpO2: 95% 97%     LOS (Days) 2 A FACE TO FACE EVALUATION WAS PERFORMED  KIRSTEINS,ANDREW E 12/25/2016, 7:13 AM

## 2016-12-25 NOTE — Progress Notes (Signed)
*  PRELIMINARY RESULTS* Vascular Ultrasound Bilateral lower extremity venous duplex has been completed.  Preliminary findings: No evidence of deep vein thrombosis or baker's cysts bilaterally.   Everrett Coombe 12/25/2016, 1:44 PM

## 2016-12-26 ENCOUNTER — Inpatient Hospital Stay (HOSPITAL_COMMUNITY): Payer: Medicare Other | Admitting: Physical Therapy

## 2016-12-26 ENCOUNTER — Inpatient Hospital Stay (HOSPITAL_COMMUNITY): Payer: Medicare Other | Admitting: Occupational Therapy

## 2016-12-26 DIAGNOSIS — I10 Essential (primary) hypertension: Secondary | ICD-10-CM

## 2016-12-26 DIAGNOSIS — G8918 Other acute postprocedural pain: Secondary | ICD-10-CM

## 2016-12-26 DIAGNOSIS — M069 Rheumatoid arthritis, unspecified: Secondary | ICD-10-CM

## 2016-12-26 DIAGNOSIS — S72462S Displaced supracondylar fracture with intracondylar extension of lower end of left femur, sequela: Secondary | ICD-10-CM

## 2016-12-26 DIAGNOSIS — N184 Chronic kidney disease, stage 4 (severe): Secondary | ICD-10-CM

## 2016-12-26 LAB — CBC WITH DIFFERENTIAL/PLATELET
Basophils Absolute: 0 10*3/uL (ref 0.0–0.1)
Basophils Relative: 0 %
EOS ABS: 0.5 10*3/uL (ref 0.0–0.7)
Eosinophils Relative: 5 %
HEMATOCRIT: 27.7 % — AB (ref 36.0–46.0)
HEMOGLOBIN: 8.9 g/dL — AB (ref 12.0–15.0)
LYMPHS ABS: 2.8 10*3/uL (ref 0.7–4.0)
LYMPHS PCT: 33 %
MCH: 32 pg (ref 26.0–34.0)
MCHC: 32.1 g/dL (ref 30.0–36.0)
MCV: 99.6 fL (ref 78.0–100.0)
MONOS PCT: 10 %
Monocytes Absolute: 0.9 10*3/uL (ref 0.1–1.0)
NEUTROS PCT: 52 %
Neutro Abs: 4.5 10*3/uL (ref 1.7–7.7)
Platelets: 256 10*3/uL (ref 150–400)
RBC: 2.78 MIL/uL — AB (ref 3.87–5.11)
RDW: 13.9 % (ref 11.5–15.5)
WBC: 8.6 10*3/uL (ref 4.0–10.5)

## 2016-12-26 LAB — COMPREHENSIVE METABOLIC PANEL
ALK PHOS: 61 U/L (ref 38–126)
ALT: 8 U/L — AB (ref 14–54)
AST: 53 U/L — AB (ref 15–41)
Albumin: 2.7 g/dL — ABNORMAL LOW (ref 3.5–5.0)
Anion gap: 6 (ref 5–15)
BUN: 45 mg/dL — AB (ref 6–20)
CALCIUM: 9.6 mg/dL (ref 8.9–10.3)
CHLORIDE: 106 mmol/L (ref 101–111)
CO2: 27 mmol/L (ref 22–32)
CREATININE: 1.87 mg/dL — AB (ref 0.44–1.00)
GFR, EST AFRICAN AMERICAN: 28 mL/min — AB (ref >60)
GFR, EST NON AFRICAN AMERICAN: 24 mL/min — AB (ref >60)
Glucose, Bld: 96 mg/dL (ref 65–99)
Potassium: 4 mmol/L (ref 3.5–5.1)
Sodium: 139 mmol/L (ref 135–145)
Total Bilirubin: 1.1 mg/dL (ref 0.3–1.2)
Total Protein: 5.9 g/dL — ABNORMAL LOW (ref 6.5–8.1)

## 2016-12-26 MED ORDER — AMLODIPINE BESYLATE 5 MG PO TABS
5.0000 mg | ORAL_TABLET | Freq: Every day | ORAL | Status: DC
  Administered 2016-12-26 – 2016-12-27 (×2): 5 mg via ORAL
  Filled 2016-12-26 (×2): qty 1

## 2016-12-26 NOTE — Progress Notes (Signed)
Occupational Therapy Session Note  Patient Details  Name: Stacy Morris MRN: [id number] Date of Birth: 01-20-1934  Today's Date: 12/26/2016 OT Individual Time: 1165-7903 OT Individual Time Calculation (min): 76 min    Short Term Goals: Week 1:  OT Short Term Goal 1 (Week 1): Pt will complete sit<stand for LB dressing with Min A while adhering to precautions OT Short Term Goal 2 (Week 1): Pt will complete toilet or BSC transfer with Mod A and LRAD OT Short Term Goal 3 (Week 1): Pt will don footwear with Mod A and AE as needed  Skilled Therapeutic Interventions/Progress Updates:    Pt completed stand pivot transfers from wheelchair to tub bench with mod assist using the RW for support.  Once sitting she was able to lift her LEs into the tub with supervision.  Discussed need for tub bench at home as well as hand held shower.  Mod assist for sit to stand from the tub bench with use of the RW and for pivot transfer back to the wheelchair.  Worked on sit to stand and taking small steps while adhering to her weightbearing status on the LLE.  Used force guard for weightbearing check and she was inconsistent with maintaining TDWBing through the LLE.  Once standing she was only able to tolerate it for 1 min or slightly more and then would fatigue in her RLE and in her arms.  Returned to room via wheelchair where she completed stand pivot transfer to the 3:1 with mod assist.  Toileting and transfer back to the wheelchair completed at the same level as well.  Pt transferred back to bed to finish session at mod assist.  She transitioned to supine with supervision.  Call button and phone in reach.      Therapy Documentation Precautions:  Precautions Precautions: Fall Restrictions Weight Bearing Restrictions: Yes LLE Weight Bearing: Touchdown weight bearing  Pain: Pain Assessment Pain Assessment: No/denies pain ADL: See Function Navigator for Current Functional Status.   Therapy/Group: Individual  Therapy  Lannette Avellino OTR/L 12/26/2016, 3:39 PM

## 2016-12-26 NOTE — Care Management (Signed)
[facility] Individual Statement of Services  Patient Name:  Stacy Morris  Date:  12/26/2016  Welcome to the [facility].  Our goal is to provide you with an individualized program based on your diagnosis and situation, designed to meet your specific needs.  With this comprehensive rehabilitation program, you will be expected to participate in at least 3 hours of rehabilitation therapies Monday-Friday, with modified therapy programming on the weekends.  Your rehabilitation program will include the following services:  Physical Therapy (PT), Occupational Therapy (OT), 24 hour per day rehabilitation nursing, Therapeutic Recreaction (TR), Case Management (Social Worker), Rehabilitation Medicine, Nutrition Services and Pharmacy Services  Weekly team conferences will be held on Wednesdays to discuss your progress.  Your Social Worker will talk with you frequently to get your input and to update you on team discussions.  Team conferences with you and your family in attendance may also be held.  Expected length of stay: 10-14 days  Overall anticipated outcome: supervision  Depending on your progress and recovery, your program may change. Your Social Worker will coordinate services and will keep you informed of any changes. Your Social Worker's name and contact numbers are listed  below.  The following services may also be recommended but are not provided by the [facility] will be made to provide these services after discharge if needed.  Arrangements include referral to agencies that provide these services.  Your insurance has been verified to be:  [facility] Medicare Your primary doctor is:  Stage manager @ [facility]  Pertinent information will be shared with your doctor and your insurance company.  Social  Worker:  Ovidio Kin, [place] or ([phone]  Information discussed with and copy given to patient by: Lennart Pall, 12/26/2016, 3:16 PM

## 2016-12-26 NOTE — Progress Notes (Signed)
Patient information reviewed and entered into eRehab system by Vaanya Shambaugh, RN, CRRN, PPS Coordinator.  Information including medical coding and functional independence measure will be reviewed and updated through discharge.     Per nursing patient was given "Data Collection Information Summary for Patients in Inpatient Rehabilitation Facilities with attached "Privacy Act Statement-Health Care Records" upon admission.  
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
## 2016-12-26 NOTE — Progress Notes (Signed)
Subjective/Complaints: Pt seen laying in bed this AM.  She slept well overnight and had a good weekend.  She states she needs to have a BM this AM.   Review of systems:  Denies chest pain, shortness of breath, nausea vomiting, diarrhea.  Objective: Vital Signs: Blood pressure (!) 158/70, pulse 68, temperature 98.6 F (37 C), temperature source Oral, resp. rate 16, height 5' 7"  (1.702 m), weight 104 kg (229 lb 4.5 oz), SpO2 95 %. No results found. Results for orders placed or performed during the hospital encounter of 12/23/16 (from the past 72 hour(s))  CBC WITH DIFFERENTIAL     Status: Abnormal   Collection Time: 12/26/16  5:04 AM  Result Value Ref Range   WBC 8.6 4.0 - 10.5 K/uL   RBC 2.78 (L) 3.87 - 5.11 MIL/uL   Hemoglobin 8.9 (L) 12.0 - 15.0 g/dL   HCT 27.7 (L) 36.0 - 46.0 %   MCV 99.6 78.0 - 100.0 fL   MCH 32.0 26.0 - 34.0 pg   MCHC 32.1 30.0 - 36.0 g/dL   RDW 13.9 11.5 - 15.5 %   Platelets 256 150 - 400 K/uL   Neutrophils Relative % 52 %   Neutro Abs 4.5 1.7 - 7.7 K/uL   Lymphocytes Relative 33 %   Lymphs Abs 2.8 0.7 - 4.0 K/uL   Monocytes Relative 10 %   Monocytes Absolute 0.9 0.1 - 1.0 K/uL   Eosinophils Relative 5 %   Eosinophils Absolute 0.5 0.0 - 0.7 K/uL   Basophils Relative 0 %   Basophils Absolute 0.0 0.0 - 0.1 K/uL  Comprehensive metabolic panel     Status: Abnormal   Collection Time: 12/26/16  5:04 AM  Result Value Ref Range   Sodium 139 135 - 145 mmol/L   Potassium 4.0 3.5 - 5.1 mmol/L   Chloride 106 101 - 111 mmol/L   CO2 27 22 - 32 mmol/L   Glucose, Bld 96 65 - 99 mg/dL   BUN 45 (H) 6 - 20 mg/dL   Creatinine, Ser 1.87 (H) 0.44 - 1.00 mg/dL   Calcium 9.6 8.9 - 10.3 mg/dL   Total Protein 5.9 (L) 6.5 - 8.1 g/dL   Albumin 2.7 (L) 3.5 - 5.0 g/dL   AST 53 (H) 15 - 41 U/L   ALT 8 (L) 14 - 54 U/L   Alkaline Phosphatase 61 38 - 126 U/L   Total Bilirubin 1.1 0.3 - 1.2 mg/dL   GFR calc non Af Amer 24 (L) >60 mL/min   GFR calc Af Amer 28 (L) >60 mL/min     Comment: (NOTE) The eGFR has been calculated using the CKD EPI equation. This calculation has not been validated in all clinical situations. eGFR's persistently <60 mL/min signify possible Chronic Kidney Disease.    Anion gap 6 5 - 15    General: No acute distress. Vital signs reviewed.  Heart: Regular rate and rhythm. No JVD. Lungs: Clear to auscultation, breathing unlabored Abdomen: Positive bowel sounds, nondistended Musculoskeletal: LLE edema and mild tenderness Neurologic:  Motor: 5/5 in bilateral deltoid, bicep, tricep, grip,  5/5 right hip flexor, knee extensors, ankle dorsiflexor and plantar flexor 4-/5 left hip flexors, knee extensors, 5/5 ADF/PF Skin: No evidence of breakdown, no evidence of rash  Assessment/Plan: 1. Functional deficits secondary to left periprosthetic distal femur fracture which require 3+ hours per day of interdisciplinary therapy in a comprehensive inpatient rehab setting. Physiatrist is providing close team supervision and 24 hour management of active medical problems  listed below. Physiatrist and rehab team continue to assess barriers to discharge/monitor patient progress toward functional and medical goals. FIM: Function - Bathing Position: Wheelchair/chair at sink Body parts bathed by patient: Right arm, Left arm, Chest, Abdomen, Front perineal area, Right upper leg, Left upper leg Body parts bathed by helper: Buttocks, Right lower leg, Left lower leg, Back Assist Level: (Mod A)  Function- Upper Body Dressing/Undressing What is the patient wearing?: Bra, Pull over shirt/dress Bra - Perfomed by patient: Thread/unthread right bra strap, Thread/unthread left bra strap, Hook/unhook bra (pull down sports bra) Pull over shirt/dress - Perfomed by patient: Thread/unthread right sleeve, Thread/unthread left sleeve, Put head through opening, Pull shirt over trunk Function - Lower Body Dressing/Undressing What is the patient wearing?: Pants, Underwear,  Non-skid slipper socks, Ted Hose Position: Wheelchair/chair at [employer] - Performed by patient: Thread/unthread right underwear leg, Thread/unthread left underwear leg Underwear - Performed by helper: Pull underwear up/down Pants- Performed by patient: Thread/unthread right pants leg, Thread/unthread left pants leg Pants- Performed by helper: Pull pants up/down Non-skid slipper socks- Performed by helper: Don/doff right sock, Don/doff left sock TED Hose - Performed by helper: Don/doff right TED hose, Don/doff left TED hose Assist for footwear: Dependant Assist for lower body dressing: (Mod A)  Function - Toileting Toileting activity did not occur: No continent bowel/bladder event Toileting steps completed by patient: Performs perineal hygiene Toileting steps completed by helper: Adjust clothing prior to toileting, Adjust clothing after toileting Toileting Assistive Devices: Grab bar or rail Assist level: Touching or steadying assistance (Pt.75%)  Function - Air cabin crew transfer activity did not occur: Safety/medical concerns Toilet transfer assistive device: Elevated toilet seat/BSC over toilet Assist level to toilet: Moderate assist (Pt 50 - 74%/lift or lower) Assist level from toilet: Moderate assist (Pt 50 - 74%/lift or lower) Assist level to bedside commode (at bedside): Maximal assist (Pt 25 - 49%/lift and lower) Assist level from bedside commode (at bedside): Maximal assist (Pt 25 - 49%/lift and lower)  Function - Chair/bed transfer Chair/bed transfer method: Stand pivot Chair/bed transfer assist level: Moderate assist (Pt 50 - 74%/lift or lower) Chair/bed transfer assistive device: Armrests, Walker  Function - Locomotion: Wheelchair Type: Manual Max wheelchair distance: 11f  Assist Level: Touching or steadying assistance (Pt > 75%) Assist Level: Touching or steadying assistance (Pt > 75%) Wheel 150 feet activity did not occur: Safety/medical  concerns Turns around,maneuvers to table,bed, and toilet,negotiates 3% grade,maneuvers on rugs and over doorsills: No Function - Locomotion: Ambulation Assistive device: Walker-rolling Max distance: 136f Assist level: Moderate assist (Pt 50 - 74%) Assist level: Moderate assist (Pt 50 - 74%) Walk 50 feet with 2 turns activity did not occur: Safety/medical concerns Walk 150 feet activity did not occur: Safety/medical concerns  Function - Comprehension Comprehension: Auditory Comprehension assist level: Set up assist with hearing device  Function - Expression Expression: Verbal Expression assist level: Expresses basic needs/ideas: With extra time/assistive device  Function - Social Interaction Social Interaction assist level: Interacts appropriately with others with medication or extra time (anti-anxiety, antidepressant).  Function - Problem Solving Problem solving assist level: Solves basic problems with no assist  Function - Memory Memory assist level: More than reasonable amount of time Patient normally able to recall (first 3 days only): Current season, Location of own room, That he or she is in a hospital   Medical Problem List and Plan: 1.  Decreased functional mobility with gait disorder and impairment of ADLs secondary to left supracondylar fracture of the  femur s/p ORIF 12/20/2016. Touchdown weightbearing   Cont CIR  2.  DVT Prophylaxis/Anticoagulation: Subcutaneous Lovenox.    Vascular study neg for DVT 3. Pain Management: Oxycodone as needed 4. Mood: Celexa 40 mg daily, Xanax 0.5 mg 3 times a day as needed 5. Neuropsych: This patient is capable of making decisions on her own behalf. 6. Skin/Wound Care: Routine skin checks 7. Fluids/Electrolytes/Nutrition: Routine I&O's 8. Acute blood loss anemia.    Hb 8.9 on 11/12   Cont to monitor 9. CKD stage IV.    Cr. 1.87 on 11/12   Cont to monitor 10. Hypothyroidism. Continue Synthroid 11. Rheumatoid arthritis. Continue  chronic prednisone 5 mg daily. Patient receiving Enbrel 50 mg weekly 12. History of gout. Patient receiving allopurinol daily 13. Constipation. Laxative assistance 14. HTN ,on 4 meds at home, norvasc cozaar and tenormin are listed   Restarted norvasc 93m 11/11   Cont to monitor Vitals:   12/25/16 1445 12/26/16 0526  BP: (!) 148/61 (!) 158/70  Pulse: 73 68  Resp: 18 16  Temp: 98.3 F (36.8 C) 98.6 F (37 C)  SpO2: 95% 95%   LOS (Days) 3 A FACE TO FACE EVALUATION WAS PERFORMED  Anouk Critzer ALorie Phenix11/01/2017, 8:43 AM

## 2016-12-26 NOTE — Plan of Care (Signed)
  Progressing RH BOWEL ELIMINATION RH STG MANAGE BOWEL WITH ASSISTANCE Description STG Manage Bowel with Mod I Assistance.   12/26/2016 1625 - Progressing by Marney Setting, RN RH STG MANAGE BOWEL W/MEDICATION W/ASSISTANCE Description STG Manage Bowel with Medication with Mod I Assistance.  12/26/2016 1625 - Progressing by Marney Setting, RN RH SKIN INTEGRITY RH STG MAINTAIN SKIN INTEGRITY WITH ASSISTANCE Description STG Maintain Skin Integrity With min Assistance.  12/26/2016 1625 - Progressing by Marney Setting, RN RH SAFETY RH STG ADHERE TO SAFETY PRECAUTIONS W/ASSISTANCE/DEVICE Description STG Adhere to Safety Precautions With min  Assistance/Device.  12/26/2016 1625 - Progressing by Marney Setting, RN RH STG DECREASED RISK OF FALL WITH ASSISTANCE Description STG Decreased Risk of Fall With min Assistance.  12/26/2016 1625 - Progressing by Marney Setting, RN RH BLADDER ELIMINATION RH STG MANAGE BLADDER WITH ASSISTANCE Description STG Manage Bladder With min Assistance  12/26/2016 1625 - Progressing by Marney Setting, RN

## 2016-12-26 NOTE — Progress Notes (Signed)
Physical Therapy Session Note  Patient Details  Name: Stacy Morris MRN: [id number] Date of Birth: 05-23-1933  Today's Date: 12/26/2016 PT Individual Time: 2683-4196 PT Individual Time Calculation (min): 41 min   Short Term Goals: Week 1:  PT Short Term Goal 1 (Week 1): Pt will perform stand pivot transfers with min assist and LRAD PT Short Term Goal 2 (Week 1): Pt will ambulate x 50 ft with LRAD and maintaining TTWB on L LE with min assist PT Short Term Goal 3 (Week 1): Pt will perform bed mobility with supervision  Skilled Therapeutic Interventions/Progress Updates:    no c/o pain.  Session focus on maintaining WB restrictions with functional mobility, strengthening, and flexibility.   Pt transfers supine<>sit x2 throughout session with supervision.  Sit<>Stand transfers with min>mod assist and stand/pivot with min assist throughout session with RW, pt able to maintain WB restrictions during transfers.  PT instructed pt in AROM L knee flexion x2 minutes with 10 reps of flexion with slight over pressure for stretch.  Pt completes 2x10 LAQ, supine hip marching, heel slides, and hip abd/add for strengthening.  Gait training x10' with RW and overall steady assist with pressure shoe for auditory feedback of weight bearing status.  Pt able to maintain weight bearing during RLE step through ~10% of the time and discouraged/anxious about her difficulty with this.  PT provided emotional support and encouragement. Pt returned to room at end of session, positioned upright in w/c with call bell in reach and needs met.   Therapy Documentation Precautions:  Precautions Precautions: Fall Restrictions Weight Bearing Restrictions: Yes LLE Weight Bearing: Touchdown weight bearing   See Function Navigator for Current Functional Status.   Therapy/Group: Individual Therapy  Michel Santee 12/26/2016, 4:29 PM

## 2016-12-26 NOTE — Progress Notes (Signed)
Physical Therapy Session Note  Patient Details  Name: Stacy Morris MRN: [id number] Date of Birth: 12-21-1933  Today's Date: 12/26/2016 PT Individual Time: 0900-1015 PT Individual Time Calculation (min): 75 min   Short Term Goals: Week 1:  PT Short Term Goal 1 (Week 1): Pt will perform stand pivot transfers with min assist and LRAD PT Short Term Goal 2 (Week 1): Pt will ambulate x 50 ft with LRAD and maintaining TTWB on L LE with min assist PT Short Term Goal 3 (Week 1): Pt will perform bed mobility with supervision  Skilled Therapeutic Interventions/Progress Updates: Pt presented in w/c with son in law present agreeable to therapy. Pt denies pain at present. Pt propelled w/c with minA to rehab gym with cues to use BUE equally to maintain straight trajectory. Performed sit to/from stands with toe touch sensor placed. Pt initially performed from 22in with sensor tripping approx 50% of time progressing to maintaining TTWB from 19in height.Pt able to progress sit to stand transfers from min/modA to min guard. Pt performed standing tolerance with TTWB creating 24 piece puzzle with no tripping of sensor. Initiated gait training x 48f with modA and cues to increase clearance of LLE. Pt unable to maintain wt bearing with fatigue. Pt returned to w/c and participated in seated therex including LAQ, hip flexion, hip abd/add, and manually resisted HS pulls with 1.5# wt. Pt returned to room and remained in w/c with call bell within reach and current needs met.      Therapy Documentation Precautions:  Precautions Precautions: Fall Restrictions Weight Bearing Restrictions: Yes LLE Weight Bearing: Touchdown weight bearing General:   Vital Signs:  Pain: Pain Assessment Pain Assessment: No/denies pain Pain Score: 2 (working with therapy)   See Function Navigator for Current Functional Status.   Therapy/Group: Individual Therapy  Germaine Shenker  Arlette Schaad, PTA  12/26/2016, 12:09 PM

## 2016-12-26 NOTE — IPOC Note (Signed)
Overall Plan of Care [facility]) Patient Details Name: Stacy Morris MRN: [id number] DOB: May 08, 1933  Admitting Diagnosis: Left femur fracture  Hospital Problems: Active Problems:   Displaced supracondylar fracture with intracondylar extension of lower end of left femur, initial encounter for closed fracture [facility])     Functional Problem List: Nursing Bladder, Bowel, Edema, Endurance, Medication Management, Motor, Nutrition, Pain, Perception, Safety, Skin Integrity  PT Balance, Behavior, Endurance, Edema, Pain, Safety, Skin Integrity  OT Balance, Pain, Safety, Edema, Endurance, Motor  SLP    TR         Basic ADL's: OT Grooming, Bathing, Dressing, Toileting     Advanced  ADL's: OT Simple Meal Preparation     Transfers: PT Bed Mobility, Bed to Chair, Furniture, Musician, Floor  OT Toilet, Metallurgist: PT Ambulation, Emergency planning/management officer, Stairs     Additional Impairments: OT None  SLP        TR      Anticipated Outcomes Item Anticipated Outcome  Self Feeding No goal  Swallowing      Basic self-care  Supervision-Mod I  Toileting  Mod I    Bathroom Transfers Mod I   Bowel/Bladder  mmin assist   Transfers  Mod I with LRAd   Locomotion  Ambulatory with Supervision assist and LRAD   Communication     Cognition     Pain  less<3  Safety/Judgment  min assist   Therapy Plan: PT Intensity: Minimum of 1-2 x/day ,45 to 90 minutes PT Frequency: 5 out of 7 days PT Duration Estimated Length of Stay: 10-14days  OT Intensity: Minimum of 1-2 x/day, 45 to 90 minutes OT Frequency: 5 out of 7 days OT Duration/Estimated Length of Stay: 15-17 days      Team Interventions: Nursing Interventions Patient/Family Education, Pain Management, Bowel Management, Bladder Management, Disease Management/Prevention, Medication Management, Discharge Planning, Skin Care/Wound Management, Psychosocial Support  PT interventions Balance/vestibular training, Ambulation/gait  training, Cognitive remediation/compensation, Community reintegration, Discharge planning, Disease management/prevention, DME/adaptive equipment instruction, Functional electrical stimulation, Functional mobility training, Neuromuscular re-education, Pain management, Patient/family education, Psychosocial support, Skin care/wound management, Stair training, Therapeutic Activities, Visual/perceptual remediation/compensation, Therapeutic Exercise, UE/LE Strength taining/ROM, Wheelchair propulsion/positioning, UE/LE Coordination activities  OT Interventions Balance/vestibular training, Discharge planning, Pain management, Self Care/advanced ADL retraining, Therapeutic Activities, UE/LE Coordination activities, Cognitive remediation/compensation, Disease mangement/prevention, Functional mobility training, Patient/family education, Therapeutic Exercise, Community reintegration, Engineer, drilling, Neuromuscular re-education, Psychosocial support, UE/LE Strength taining/ROM, Wheelchair propulsion/positioning  SLP Interventions    TR Interventions    SW/CM Interventions Discharge Planning, Psychosocial Support, Patient/Family Education   Barriers to Discharge MD  Medical stability, Home enviroment access/loayout, Lack of/limited family support and Weight bearing restrictions  Nursing      PT Inaccessible home environment, Decreased caregiver support, Home environment access/layout    OT Lack of/limited family support, Weight bearing restrictions    SLP      SW       Team Discharge Planning: Destination: PT-Home ,OT- Home , SLP-  Projected Follow-up: PT-Home health PT, OT-  Home health OT, SLP-  Projected Equipment Needs: PT-Wheelchair cushion (measurements), Wheelchair (measurements), Rolling walker with 5" wheels, OT- To be determined, SLP-  Equipment Details: PT- , OT-  Patient/family involved in discharge planning: PT- Patient,  OT-Patient, SLP-   MD ELOS: 12-16 days. Medical  Rehab Prognosis:  Excellent Assessment: 83 y.o.right handed femalewith history of rheumatoid arthritis maintained on Enbrel, CKDstage III, hypertension, left knee replacement in 2000 and right knee replacement 2007. Patient  still mows her own grass, cooks, cleans and drives. Presented 12/20/2016 after a fall while at church without loss of consciousness. X-rays revealed a left periprosthetic distal femur fracture. Underwent ORIF 12/20/2016 per Dr. Doreatha Martin. Hospital course pain management. Touchdown weightbearing left lower extremity. Acute blood loss anemia monitored. Patient with resulting functional deficits with mobility, transfers, self-care. Will set goals for Supervision/Mod I with PT/OT.   See Team Conference Notes for weekly updates to the plan of care

## 2016-12-27 ENCOUNTER — Inpatient Hospital Stay (HOSPITAL_COMMUNITY): Payer: Medicare Other | Admitting: Occupational Therapy

## 2016-12-27 ENCOUNTER — Inpatient Hospital Stay (HOSPITAL_COMMUNITY): Payer: Medicare Other

## 2016-12-27 MED ORDER — GERHARDT'S BUTT CREAM
TOPICAL_CREAM | Freq: Three times a day (TID) | CUTANEOUS | Status: DC | PRN
  Administered 2016-12-28: 21:00:00 via TOPICAL
  Filled 2016-12-27: qty 1

## 2016-12-27 MED ORDER — AMLODIPINE BESYLATE 5 MG PO TABS: 5.0000 mg | ORAL_TABLET | Freq: Every day | ORAL | Status: DC

## 2016-12-27 MED ORDER — AMLODIPINE BESYLATE 10 MG PO TABS
10.0000 mg | ORAL_TABLET | Freq: Every day | ORAL | Status: DC
  Administered 2016-12-28 – 2017-01-07 (×11): 10 mg via ORAL
  Filled 2016-12-27: qty 1
  Filled 2016-12-27: qty 2
  Filled 2016-12-27 (×10): qty 1

## 2016-12-27 MED ORDER — AMLODIPINE BESYLATE 5 MG PO TABS
5.0000 mg | ORAL_TABLET | Freq: Once | ORAL | Status: AC
  Administered 2016-12-27: 5 mg via ORAL
  Filled 2016-12-27: qty 1

## 2016-12-27 NOTE — Progress Notes (Signed)
Subjective/Complaints: Pt seen laying in bed this AM. She slept well overnight.  She notes her RA is improving.    Review of systems:  Denies chest pain, shortness of breath, nausea vomiting, diarrhea.  Objective: Vital Signs: Blood pressure (!) 157/76, pulse 81, temperature 98 F (36.7 C), temperature source Oral, resp. rate 18, height 5' 7"  (1.702 m), weight 104 kg (229 lb 4.5 oz), SpO2 100 %. No results found. Results for orders placed or performed during the hospital encounter of 12/23/16 (from the past 72 hour(s))  CBC WITH DIFFERENTIAL     Status: Abnormal   Collection Time: 12/26/16  5:04 AM  Result Value Ref Range   WBC 8.6 4.0 - 10.5 K/uL   RBC 2.78 (L) 3.87 - 5.11 MIL/uL   Hemoglobin 8.9 (L) 12.0 - 15.0 g/dL   HCT 27.7 (L) 36.0 - 46.0 %   MCV 99.6 78.0 - 100.0 fL   MCH 32.0 26.0 - 34.0 pg   MCHC 32.1 30.0 - 36.0 g/dL   RDW 13.9 11.5 - 15.5 %   Platelets 256 150 - 400 K/uL   Neutrophils Relative % 52 %   Neutro Abs 4.5 1.7 - 7.7 K/uL   Lymphocytes Relative 33 %   Lymphs Abs 2.8 0.7 - 4.0 K/uL   Monocytes Relative 10 %   Monocytes Absolute 0.9 0.1 - 1.0 K/uL   Eosinophils Relative 5 %   Eosinophils Absolute 0.5 0.0 - 0.7 K/uL   Basophils Relative 0 %   Basophils Absolute 0.0 0.0 - 0.1 K/uL  Comprehensive metabolic panel     Status: Abnormal   Collection Time: 12/26/16  5:04 AM  Result Value Ref Range   Sodium 139 135 - 145 mmol/L   Potassium 4.0 3.5 - 5.1 mmol/L   Chloride 106 101 - 111 mmol/L   CO2 27 22 - 32 mmol/L   Glucose, Bld 96 65 - 99 mg/dL   BUN 45 (H) 6 - 20 mg/dL   Creatinine, Ser 1.87 (H) 0.44 - 1.00 mg/dL   Calcium 9.6 8.9 - 10.3 mg/dL   Total Protein 5.9 (L) 6.5 - 8.1 g/dL   Albumin 2.7 (L) 3.5 - 5.0 g/dL   AST 53 (H) 15 - 41 U/L   ALT 8 (L) 14 - 54 U/L   Alkaline Phosphatase 61 38 - 126 U/L   Total Bilirubin 1.1 0.3 - 1.2 mg/dL   GFR calc non Af Amer 24 (L) >60 mL/min   GFR calc Af Amer 28 (L) >60 mL/min    Comment: (NOTE) The eGFR has  been calculated using the CKD EPI equation. This calculation has not been validated in all clinical situations. eGFR's persistently <60 mL/min signify possible Chronic Kidney Disease.    Anion gap 6 5 - 15    General: NAD. Vital signs reviewed.  Heart: RRR. No JVD. Lungs: Clear to auscultation, breathing unlabored Abdomen: Positive bowel sounds, nondistended Musculoskeletal: LLE edema and mild tenderness Neurologic:  Motor: 5/5 in bilateral deltoid, bicep, tricep, grip,  5/5 right hip flexor, knee extensors, ankle dorsiflexor and plantar flexor 4-/5 left hip flexors, knee extensors, 5/5 ADF/PF (stable) Skin: No evidence of breakdown, no evidence of rash  Assessment/Plan: 1. Functional deficits secondary to left periprosthetic distal femur fracture which require 3+ hours per day of interdisciplinary therapy in a comprehensive inpatient rehab setting. Physiatrist is providing close team supervision and 24 hour management of active medical problems listed below. Physiatrist and rehab team continue to assess barriers to discharge/monitor patient  progress toward functional and medical goals. FIM: Function - Bathing Position: Wheelchair/chair at sink Body parts bathed by patient: Right arm, Left arm, Chest, Abdomen, Front perineal area, Right upper leg, Left upper leg Body parts bathed by helper: Buttocks, Right lower leg, Left lower leg, Back Assist Level: (Mod A)  Function- Upper Body Dressing/Undressing What is the patient wearing?: Bra, Pull over shirt/dress Bra - Perfomed by patient: Thread/unthread right bra strap, Thread/unthread left bra strap, Hook/unhook bra (pull down sports bra) Pull over shirt/dress - Perfomed by patient: Thread/unthread right sleeve, Thread/unthread left sleeve, Put head through opening, Pull shirt over trunk Assist Level: Supervision or verbal cues(per report) Function - Lower Body Dressing/Undressing What is the patient wearing?: Pants, Underwear,  Non-skid slipper socks, Ted Hose Position: Wheelchair/chair at [employer] - Performed by patient: Thread/unthread right underwear leg, Thread/unthread left underwear leg Underwear - Performed by helper: Pull underwear up/down Pants- Performed by patient: Thread/unthread right pants leg, Thread/unthread left pants leg Pants- Performed by helper: Pull pants up/down Non-skid slipper socks- Performed by helper: Don/doff right sock, Don/doff left sock TED Hose - Performed by helper: Don/doff right TED hose, Don/doff left TED hose Assist for footwear: Dependant Assist for lower body dressing: (Mod A)  Function - Toileting Toileting activity did not occur: No continent bowel/bladder event Toileting steps completed by patient: Performs perineal hygiene, Adjust clothing prior to toileting, Adjust clothing after toileting Toileting steps completed by helper: Adjust clothing prior to toileting, Adjust clothing after toileting Toileting Assistive Devices: Grab bar or rail Assist level: Touching or steadying assistance (Pt.75%)  Function - Air cabin crew transfer activity did not occur: Safety/medical concerns Toilet transfer assistive device: Elevated toilet seat/BSC over toilet Assist level to toilet: Moderate assist (Pt 50 - 74%/lift or lower) Assist level from toilet: Moderate assist (Pt 50 - 74%/lift or lower) Assist level to bedside commode (at bedside): Maximal assist (Pt 25 - 49%/lift and lower) Assist level from bedside commode (at bedside): Maximal assist (Pt 25 - 49%/lift and lower)  Function - Chair/bed transfer Chair/bed transfer method: Stand pivot Chair/bed transfer assist level: Touching or steadying assistance (Pt > 75%) Chair/bed transfer assistive device: Environmental consultant, Armrests  Function - Locomotion: Wheelchair Type: Manual Max wheelchair distance: 128f  Assist Level: Touching or steadying assistance (Pt > 75%) Assist Level: Touching or steadying assistance (Pt >  75%) Wheel 150 feet activity did not occur: Safety/medical concerns Turns around,maneuvers to table,bed, and toilet,negotiates 3% grade,maneuvers on rugs and over doorsills: No Function - Locomotion: Ambulation Assistive device: Walker-rolling Max distance: 10 Assist level: Touching or steadying assistance (Pt > 75%) Assist level: Touching or steadying assistance (Pt > 75%) Walk 50 feet with 2 turns activity did not occur: Safety/medical concerns Walk 150 feet activity did not occur: Safety/medical concerns Walk 10 feet on uneven surfaces activity did not occur: Safety/medical concerns(per report)  Function - Comprehension Comprehension: Auditory Comprehension assistive device: Hearing aids Comprehension assist level: Set up assist with hearing device  Function - Expression Expression: Verbal Expression assist level: Expresses basic needs/ideas: With extra time/assistive device  Function - Social Interaction Social Interaction assist level: Interacts appropriately 90% of the time - Needs monitoring or encouragement for participation or interaction.  Function - Problem Solving Problem solving assist level: Solves basic 90% of the time/requires cueing < 10% of the time  Function - Memory Memory assist level: Recognizes or recalls 90% of the time/requires cueing < 10% of the time Patient normally able to recall (first 3 days only): Current season,  Location of own room, That he or she is in a hospital   Medical Problem List and Plan: 1.  Decreased functional mobility with gait disorder and impairment of ADLs secondary to left supracondylar fracture of the femur s/p ORIF 12/20/2016. Touchdown weightbearing   Cont CIR  2.  DVT Prophylaxis/Anticoagulation: Subcutaneous Lovenox.    Vascular study neg for DVT 3. Pain Management: Oxycodone as needed 4. Mood: Celexa 40 mg daily, Xanax 0.5 mg 3 times a day as needed 5. Neuropsych: This patient is capable of making decisions on her own  behalf. 6. Skin/Wound Care: Routine skin checks 7. Fluids/Electrolytes/Nutrition: Routine I&O's 8. Acute blood loss anemia.    Hb 8.9 on 11/12   Cont to monitor 9. CKD stage IV.    Cr. 1.87 on 11/12   Cont to monitor 10. Hypothyroidism. Continue Synthroid 11. Rheumatoid arthritis. Continue chronic prednisone 5 mg daily. Patient receiving Enbrel 50 mg weekly 12. History of gout. Patient receiving allopurinol daily 13. Constipation. Laxative assistance 14. HTN    On 4 meds at home, norvasc cozaar and tenormin are listed   Restarted norvasc 41m 11/11, increased to 1100mon 11/13   Cont to monitor Vitals:   12/26/16 1530 12/27/16 0534  BP: (!) 124/58 (!) 157/76  Pulse: 74 81  Resp: 18   Temp: 98.4 F (36.9 C) 98 F (36.7 C)  SpO2: 96% 100%   LOS (Days) 4 A FACE TO FACE EVALUATION WAS PERFORMED  Stacy Morris AnLorie Phenix1/13/2018, 8:34 AM

## 2016-12-27 NOTE — Progress Notes (Signed)
Physical Therapy Session Note  Patient Details  Name: Stacy Morris MRN: [id number] Date of Birth: 01-04-1934  Today's Date: 12/27/2016 PT Individual Time: 0800-0858, and 1615-1700   Total time: 58 min and 45 min  Short Term Goals: Week 1:  PT Short Term Goal 1 (Week 1): Pt will perform stand pivot transfers with min assist and LRAD PT Short Term Goal 2 (Week 1): Pt will ambulate x 50 ft with LRAD and maintaining TTWB on L LE with min assist PT Short Term Goal 3 (Week 1): Pt will perform bed mobility with supervision  Skilled Therapeutic Interventions/Progress Updates:    Session 1: Pt supine in bed upon PT arrival, agreeable to therapy tx and denies pain. Pt transferred supine>sitting EOB with supervision. Pt performed stand pivot transfer from bed>w/c and w/c<>toilet with min assist, verbal cues for techniques and L LE precautions. Pt performed sit<>stands with min assist and verbal cues for technique, completed lower body dressing with min assist. Pt sat in w/c at sink to wash hands, brush teeth and brush hair with supervision. Pt transported to gym in w/c total assist. Session focused on gait and transfers with LE sensor for WB precautions. Pt ambulated 10 ft using RW and min assist, only maintaining TTWB on L LE 10% of the time despite max verbal cues for techniques. Pt practiced sit<>stands x 5 with min assist, maintaining precautions 90% of the time. Pt performed x 3 stand pivot transfers using RW and min assist from w/c<>mat, maintaining precautions 70 % of the time, verbal cues for technique. Pt left seated in w/c at end of session with needs in reach.   Session 2: Pt supine in bed upon PT arrival, agreeable to therapy tx and denies pain this session. Pt transferred from supine to sitting EOB with supervision. Pt transferred bed>w/c with min assist, stand pivot using RW. Session focused on gait training in parallel bars using foot sensor to focus on L LE TTWB precaution adherence. Pt  ambulated 5 x 15 ft min assist in the parallel bars, maintaining precautions 70% of the time, verbal cues to "hop" with R LE, slow down and for technique. Pt requiring verbal cues during sitting to "stick left leg out" in order to maintain precautions. Pt left seated in w/c at end of session with needs in reach.   Therapy Documentation Precautions:  Precautions Precautions: Fall Restrictions Weight Bearing Restrictions: Yes LLE Weight Bearing: Touchdown weight bearing   See Function Navigator for Current Functional Status.   Therapy/Group: Individual Therapy  Netta Corrigan, PT, DPT 12/27/2016, 7:46 AM

## 2016-12-27 NOTE — Plan of Care (Signed)
  Progressing RH BOWEL ELIMINATION RH STG MANAGE BOWEL WITH ASSISTANCE Description STG Manage Bowel with Mod I Assistance.   12/27/2016 1203 - Progressing by Marney Setting, RN RH STG MANAGE BOWEL W/MEDICATION W/ASSISTANCE Description STG Manage Bowel with Medication with Mod I Assistance.  12/27/2016 1203 - Progressing by Marney Setting, RN RH SKIN INTEGRITY RH STG MAINTAIN SKIN INTEGRITY WITH ASSISTANCE Description STG Maintain Skin Integrity With min Assistance.  12/27/2016 1203 - Progressing by Marney Setting, RN RH SAFETY RH STG ADHERE TO SAFETY PRECAUTIONS W/ASSISTANCE/DEVICE Description STG Adhere to Safety Precautions With min  Assistance/Device.  12/27/2016 1203 - Progressing by Marney Setting, RN RH STG DECREASED RISK OF FALL WITH ASSISTANCE Description STG Decreased Risk of Fall With min Assistance.  12/27/2016 1203 - Progressing by Marney Setting, RN RH BLADDER ELIMINATION RH STG MANAGE BLADDER WITH ASSISTANCE Description STG Manage Bladder With min Assistance  12/27/2016 1203 - Progressing by Marney Setting, RN

## 2016-12-27 NOTE — Progress Notes (Signed)
Occupational Therapy Session Note  Patient Details  Name: Stacy Morris MRN: [id number] Date of Birth: [birth year]  Today's Date: 12/27/2016 OT Individual Time: 1005-1102 OT Individual Time Calculation (min): 57 min    Short Term Goals: Week 1:  OT Short Term Goal 1 (Week 1): Pt will complete sit<stand for LB dressing with Min A while adhering to precautions OT Short Term Goal 2 (Week 1): Pt will complete toilet or BSC transfer with Mod A and LRAD OT Short Term Goal 3 (Week 1): Pt will don footwear with Mod A and AE as needed  Skilled Therapeutic Interventions/Progress Updates:    pt declined B/D session with female therapist this am but agreed to participate in other therapies.  Scheduling changed for tomorrow to accommodate request.  Pt concentrated on UE exercises and strengthening during session.  Had her begin by rolling her wheelchair down to the day room.  She needed one rest break to complete the task.  Next had pt complete 3 sets using the UE ergonometer.  First set completed on level 5 for 3 mins with RPMs maintained at 25 or greater.  Second set with resistance increased to level 10 and RPMs maintained at 20-25.  Final set adjusted to level 12 with RPMs maintained at 20-25.  Next progressed to completing UE exercises using light orange theraband.  She completed 2 sets of 20 reps for elbow extension bilaterally.  She then progressed to 2 sets of 10 repetitions for shoulder flexion bilaterally as well.  Finished session with 2 sets of 10 repetitions for horizontal abduction.  Returned to room with therapist assist to complete session.   Therapy Documentation Precautions:  Precautions Precautions: Fall Restrictions Weight Bearing Restrictions: Yes LLE Weight Bearing: Touchdown weight bearing  Pain: Pain Assessment Pain Assessment: No/denies pain Pain Score: 0-No pain ADL:  See Function Navigator for Current Functional Status.   Therapy/Group: Individual  Therapy  Lashelle Koy OTR/L 12/27/2016, 12:03 PM

## 2016-12-27 NOTE — Progress Notes (Signed)
Occupational Therapy Session Note  Patient Details  Name: Stacy Morris MRN: [id number] Date of Birth: 05/26/1933  Today's Date: 12/27/2016 OT Individual Time: 3267-1245 OT Individual Time Calculation (min): 33 min    Short Term Goals: Week 1:  OT Short Term Goal 1 (Week 1): Pt will complete sit<stand for LB dressing with Min A while adhering to precautions OT Short Term Goal 2 (Week 1): Pt will complete toilet or BSC transfer with Mod A and LRAD OT Short Term Goal 3 (Week 1): Pt will don footwear with Mod A and AE as needed  Skilled Therapeutic Interventions/Progress Updates:    Pt completed sit to stand transitions in the therapy gym from the mat and the wheelchair with min assist.  Used force guard to monitor weightbearing over the LLE.  She continues to demonstrate greater then TDWBing through the LLE at this time.  During sit to stand and stand to sit transitions she would demonstrate correct weight bearing around 50% of the time, but could not take any forward or backward steps without making the guard sound off, indicating too much weight.  Had pt also complete 2 sets of 5 repetitions for wheelchair pushups to help increase UE strength to help decreased weightbearing over the LLE as well.  Finished session with transition back to the room via wheelchair and transfer back to the bed with min assist stand pivot.  Pt was able to transition to supine from sitting with supervision as well.  Call button and phone in reach.    Therapy Documentation Precautions:  Precautions Precautions: Fall Restrictions Weight Bearing Restrictions: Yes LLE Weight Bearing: Touchdown weight bearing  Pain: Pain Assessment Pain Assessment: No/denies pain ADL: See Function Navigator for Current Functional Status.   Therapy/Group: Individual Therapy  Warren Kugelman OTR/L 12/27/2016, 3:40 PM

## 2016-12-28 ENCOUNTER — Inpatient Hospital Stay (HOSPITAL_COMMUNITY): Payer: Medicare Other | Admitting: Occupational Therapy

## 2016-12-28 ENCOUNTER — Inpatient Hospital Stay (HOSPITAL_COMMUNITY): Payer: Medicare Other

## 2016-12-28 DIAGNOSIS — S8290XS Unspecified fracture of unspecified lower leg, sequela: Secondary | ICD-10-CM

## 2016-12-28 NOTE — Progress Notes (Signed)
Subjective/Complaints: Pt seen sitting up in bed this AM.  She states she selpt well overnight.  She believes she is making progress, but feels there is still a long way she needs to go.   Review of systems:  Denies chest pain, shortness of breath, nausea vomiting, diarrhea.  Objective: Vital Signs: Blood pressure (!) 156/80, pulse 75, temperature 98.3 F (36.8 C), temperature source Oral, resp. rate 20, height 5' 7" (1.702 m), weight 104 kg (229 lb 4.5 oz), SpO2 99 %. No results found. Results for orders placed or performed during the hospital encounter of 12/23/16 (from the past 72 hour(s))  CBC WITH DIFFERENTIAL     Status: Abnormal   Collection Time: 12/26/16  5:04 AM  Result Value Ref Range   WBC 8.6 4.0 - 10.5 K/uL   RBC 2.78 (L) 3.87 - 5.11 MIL/uL   Hemoglobin 8.9 (L) 12.0 - 15.0 g/dL   HCT 27.7 (L) 36.0 - 46.0 %   MCV 99.6 78.0 - 100.0 fL   MCH 32.0 26.0 - 34.0 pg   MCHC 32.1 30.0 - 36.0 g/dL   RDW 13.9 11.5 - 15.5 %   Platelets 256 150 - 400 K/uL   Neutrophils Relative % 52 %   Neutro Abs 4.5 1.7 - 7.7 K/uL   Lymphocytes Relative 33 %   Lymphs Abs 2.8 0.7 - 4.0 K/uL   Monocytes Relative 10 %   Monocytes Absolute 0.9 0.1 - 1.0 K/uL   Eosinophils Relative 5 %   Eosinophils Absolute 0.5 0.0 - 0.7 K/uL   Basophils Relative 0 %   Basophils Absolute 0.0 0.0 - 0.1 K/uL  Comprehensive metabolic panel     Status: Abnormal   Collection Time: 12/26/16  5:04 AM  Result Value Ref Range   Sodium 139 135 - 145 mmol/L   Potassium 4.0 3.5 - 5.1 mmol/L   Chloride 106 101 - 111 mmol/L   CO2 27 22 - 32 mmol/L   Glucose, Bld 96 65 - 99 mg/dL   BUN 45 (H) 6 - 20 mg/dL   Creatinine, Ser 1.87 (H) 0.44 - 1.00 mg/dL   Calcium 9.6 8.9 - 10.3 mg/dL   Total Protein 5.9 (L) 6.5 - 8.1 g/dL   Albumin 2.7 (L) 3.5 - 5.0 g/dL   AST 53 (H) 15 - 41 U/L   ALT 8 (L) 14 - 54 U/L   Alkaline Phosphatase 61 38 - 126 U/L   Total Bilirubin 1.1 0.3 - 1.2 mg/dL   GFR calc non Af Amer 24 (L) >60 mL/min    GFR calc Af Amer 28 (L) >60 mL/min    Comment: (NOTE) The eGFR has been calculated using the CKD EPI equation. This calculation has not been validated in all clinical situations. eGFR's persistently <60 mL/min signify possible Chronic Kidney Disease.    Anion gap 6 5 - 15    General: NAD. Vital signs reviewed.  Heart: RRR. No JVD. Lungs: Clear to auscultation, breathing unlabored Abdomen: Positive bowel sounds, nondistended Musculoskeletal: LLE edema and mild tenderness Neurologic:  Motor: 5/5 in bilateral deltoid, bicep, tricep, grip,  5/5 right hip flexor, knee extensors, ankle dorsiflexor and plantar flexor 4+/5 left hip flexors, knee extensors, 5/5 ADF/PF  Skin: Intact. Warm and dry.   Assessment/Plan: 1. Functional deficits secondary to left periprosthetic distal femur fracture which require 3+ hours per day of interdisciplinary therapy in a comprehensive inpatient rehab setting. Physiatrist is providing close team supervision and 24 hour management of active medical problems listed  below. Physiatrist and rehab team continue to assess barriers to discharge/monitor patient progress toward functional and medical goals. FIM: Function - Bathing Position: Wheelchair/chair at sink Body parts bathed by patient: Right arm, Left arm, Chest, Abdomen, Front perineal area, Right upper leg, Left upper leg Body parts bathed by helper: Buttocks, Right lower leg, Left lower leg, Back Assist Level: (Mod A)  Function- Upper Body Dressing/Undressing What is the patient wearing?: Bra, Pull over shirt/dress Bra - Perfomed by patient: Thread/unthread right bra strap, Thread/unthread left bra strap, Hook/unhook bra (pull down sports bra) Pull over shirt/dress - Perfomed by patient: Thread/unthread right sleeve, Thread/unthread left sleeve, Put head through opening, Pull shirt over trunk Assist Level: Supervision or verbal cues(per report) Function - Lower Body Dressing/Undressing What is the  patient wearing?: Pants, Underwear, Non-skid slipper socks, Ted Hose Position: Wheelchair/chair at [employer] - Performed by patient: Thread/unthread right underwear leg, Thread/unthread left underwear leg Underwear - Performed by helper: Pull underwear up/down Pants- Performed by patient: Thread/unthread right pants leg, Thread/unthread left pants leg Pants- Performed by helper: Pull pants up/down Non-skid slipper socks- Performed by helper: Don/doff right sock, Don/doff left sock TED Hose - Performed by helper: Don/doff right TED hose, Don/doff left TED hose Assist for footwear: Dependant Assist for lower body dressing: (Mod A)  Function - Toileting Toileting activity did not occur: No continent bowel/bladder event Toileting steps completed by patient: Performs perineal hygiene, Adjust clothing prior to toileting, Adjust clothing after toileting Toileting steps completed by helper: Adjust clothing prior to toileting, Adjust clothing after toileting Toileting Assistive Devices: Grab bar or rail Assist level: Touching or steadying assistance (Pt.75%)  Function - Air cabin crew transfer activity did not occur: Safety/medical concerns Toilet transfer assistive device: Elevated toilet seat/BSC over toilet Assist level to toilet: Moderate assist (Pt 50 - 74%/lift or lower) Assist level from toilet: Moderate assist (Pt 50 - 74%/lift or lower) Assist level to bedside commode (at bedside): Maximal assist (Pt 25 - 49%/lift and lower) Assist level from bedside commode (at bedside): Maximal assist (Pt 25 - 49%/lift and lower)  Function - Chair/bed transfer Chair/bed transfer method: Stand pivot Chair/bed transfer assist level: Touching or steadying assistance (Pt > 75%) Chair/bed transfer assistive device: Environmental consultant, Armrests  Function - Locomotion: Wheelchair Type: Manual Max wheelchair distance: 136f  Assist Level: Touching or steadying assistance (Pt > 75%) Assist Level:  Touching or steadying assistance (Pt > 75%) Wheel 150 feet activity did not occur: Safety/medical concerns Turns around,maneuvers to table,bed, and toilet,negotiates 3% grade,maneuvers on rugs and over doorsills: No Function - Locomotion: Ambulation Assistive device: Walker-rolling Max distance: 10 Assist level: Touching or steadying assistance (Pt > 75%) Assist level: Touching or steadying assistance (Pt > 75%) Walk 50 feet with 2 turns activity did not occur: Safety/medical concerns Walk 150 feet activity did not occur: Safety/medical concerns Walk 10 feet on uneven surfaces activity did not occur: Safety/medical concerns(per report)  Function - Comprehension Comprehension: Auditory Comprehension assistive device: Hearing aids Comprehension assist level: Set up assist with hearing device, Follows basic conversation/direction with no assist  Function - Expression Expression: Verbal Expression assist level: Expresses basic needs/ideas: With extra time/assistive device  Function - Social Interaction Social Interaction assist level: Interacts appropriately 90% of the time - Needs monitoring or encouragement for participation or interaction.  Function - Problem Solving Problem solving assist level: Solves basic 90% of the time/requires cueing < 10% of the time  Function - Memory Memory assist level: Recognizes or recalls 90% of the  time/requires cueing < 10% of the time Patient normally able to recall (first 3 days only): Current season, Location of own room, That he or she is in a hospital   Medical Problem List and Plan: 1.  Decreased functional mobility with gait disorder and impairment of ADLs secondary to left supracondylar fracture of the femur s/p ORIF 12/20/2016. Touchdown weightbearing   Cont CIR   Team conference today 2.  DVT Prophylaxis/Anticoagulation: Subcutaneous Lovenox.    Vascular study neg for DVT 3. Pain Management: Oxycodone as needed   Controlled at  present 4. Mood: Celexa 40 mg daily, Xanax 0.5 mg 3 times a day as needed 5. Neuropsych: This patient is capable of making decisions on her own behalf. 6. Skin/Wound Care: Routine skin checks 7. Fluids/Electrolytes/Nutrition: Routine I&O's 8. Acute blood loss anemia.    Hb 8.9 on 11/12   Cont to monitor 9. CKD stage IV.    Cr. 1.87 on 11/12   Cont to monitor 10. Hypothyroidism. Continue Synthroid 11. Rheumatoid arthritis. Continue chronic prednisone 5 mg daily. Patient receiving Enbrel 50 mg weekly 12. History of gout. Patient receiving allopurinol daily 13. Constipation. Laxative assistance 14. HTN    On 4 meds at home, norvasc cozaar and tenormin are listed   Restarted norvasc 70m 11/11, increased to 147mon 11/13   Cont to monitor, will consider further increase tomorrow if necessary Vitals:   12/27/16 1448 12/28/16 0506  BP: 116/75 (!) 156/80  Pulse: 86 75  Resp: 18 20  Temp: 98.1 F (36.7 C) 98.3 F (36.8 C)  SpO2: 99% 99%   LOS (Days) 5 A FACE TO FACE EVALUATION WAS PERFORMED  Ankit AnLorie Phenix1/14/2018, 7:55 AM

## 2016-12-28 NOTE — Progress Notes (Signed)
Social Work Patient ID: Stacy Morris, female   DOB: June 02, 1933, 83 y.o.   MRN: [id number]  Met with pt to discuss team conference goals supervision level due to WB issues and target discharge date 11/24. Have left a message for her daughter and will await a return call. Pt feels she can find someone for 2 weeks to stay with her. Will work with pt and daughter on this.

## 2016-12-28 NOTE — Patient Care Conference (Signed)
Inpatient RehabilitationTeam Conference and Plan of Care Update Date: 12/28/2016   Time: 2:50 PM    Patient Name: Stacy Morris      Medical Record Number: [id number]  Date of Birth: [birth year] Sex: Female         Room/Bed: 4M01C/4M01C-01 Payor Info: Payor: [employer] MEDICARE / Plan: [employer] MEDICARE / Product Type: *No Product type* /    Admitting Diagnosis: Femur FX  Admit Date/Time:  12/23/2016  5:48 PM Admission Comments: No comment available   Primary Diagnosis:  <principal problem not specified> Principal Problem: <principal problem not specified>  Patient Active Problem List   Diagnosis Date Noted  . Benign essential HTN   . Chronic kidney disease (CKD), stage IV (severe) ([place])   . Post-operative pain   . Closed displaced supracondylar fracture with intracondylar extension of lower end of left femur ([place]) 12/23/2016  . Fall   . Fracture   . Acute blood loss anemia   . History of gout   . Constipation due to pain medication   . Closed comminuted intra-articular fracture of distal end of left femur ([place]) 12/18/2016  . CKD (chronic kidney disease), stage III ([place]) 12/18/2016    Class: Chronic  . Hypothyroidism 12/18/2016    Class: Chronic  . Renal failure (ARF), acute on chronic (HCC) 04/09/2014  . Rheumatoid arthritis ([place]) 04/09/2014  . Essential hypertension 04/09/2014  . Vomiting and diarrhea 04/09/2014  . Acute on chronic renal failure ([place]) 04/09/2014    Expected Discharge Date: Expected Discharge Date: 01/07/17  Team Members Present: Physician leading conference: Dr. Delice Lesch Social Worker Present: Ovidio Kin, LCSW Nurse Present: Benjie Karvonen, RN PT Present: Michaelene Song, PT OT Present: Clyda Greener, OT SLP Present: Windell Moulding, SLP PPS Coordinator present : Daiva Nakayama, RN, CRRN     Current Status/Progress Goal Weekly Team Focus  Medical   Decreased functional mobility with gait disorder and impairment of ADLs secondary to left  supracondylar fracture of the femur s/p ORIF 12/20/2016. Touchdown weightbearing  Improve HTN, mobility, endurance  See above   Bowel/Bladder   continent of bowel & bladder, LBM 11/13  remain continent  continue to assess & assist as needed   Swallow/Nutrition/ Hydration             ADL's   supervision for UB selfcare, min assist for LB selfcare sit to stand, min assist for stand pivot transfers but unable to maintain TDWBing status over the LLE  supervision to modified independent overall  selfcare retraining, balance retraining, transfer training, UE strengthening, pt/family education, DME education   Mobility   supervision for bed mobility, min-mod assist for sit<>stand and transfers, gait up to 10 ft with RW min assist limited by ability to maintain TTWB  mod I- supervision goals  gait training, transfers, LE strength/ROM, activity tolerance, endurance   Communication             Safety/Cognition/ Behavioral Observations            Pain   no c/o pain/ has tylenol & oxycodone 15mg  prn  pain scale <3  continue to assess & treat as needed   Skin   left knee incisions, OTA, abdominal bruising, yeastish type rash to inner buttocks  no new areas of skin breakdown  continue to assess q shift      *See Care Plan and progress notes for long and short-term goals.     Barriers to Discharge  Current Status/Progress Possible Resolutions Date Resolved  Physician    Medical stability;Weight bearing restrictions     See above  Therapies, optimize HTN meds      Nursing                  PT  Inaccessible home environment;Decreased caregiver support;Home environment access/layout                 OT Lack of/limited family support;Weight bearing restrictions                SLP                SW Decreased caregiver support Doesn't have 24 hr care            Discharge Planning/Teaching Needs:  Family will need to come up with a plan for supervision at discharge was hoping mod/i level  goals      Team Discussion:  Goals supervision level can not maintain WB status-TDWB.  MD watching CKD and placing back on her home HTN meds. BP currently elevated. May need ramp for home due to steps.  Revisions to Treatment Plan:  DC 11/24    Continued Need for Acute Rehabilitation Level of Care: The patient requires daily medical management by a physician with specialized training in physical medicine and rehabilitation for the following conditions: Daily direction of a multidisciplinary physical rehabilitation program to ensure safe treatment while eliciting the highest outcome that is of practical value to the patient.: Yes Daily medical management of patient stability for increased activity during participation in an intensive rehabilitation regime.: Yes Daily analysis of laboratory values and/or radiology reports with any subsequent need for medication adjustment of medical intervention for : Post surgical problems;Blood pressure problems  Marl Seago, Gardiner Rhyme 12/28/2016, 3:03 PM

## 2016-12-28 NOTE — Progress Notes (Signed)
Physical Therapy Session Note  Patient Details  Name: Stacy Morris MRN: [id number] Date of Birth: 01-Mar-1933  Today's Date: 12/28/2016 PT Individual Time: 1660-6301, 6010-9323 PT Individual Time Calculation (min): 57 min , 74 min  Short Term Goals: Week 1:  PT Short Term Goal 1 (Week 1): Pt will perform stand pivot transfers with min assist and LRAD PT Short Term Goal 2 (Week 1): Pt will ambulate x 50 ft with LRAD and maintaining TTWB on L LE with min assist PT Short Term Goal 3 (Week 1): Pt will perform bed mobility with supervision  Skilled Therapeutic Interventions/Progress Updates:    Session 1: Pt seated in w/c at sink brushing teeth upon PT arrival, agreeable to therapy tx and denies pain. Pt transported to gym in w/c. Pt performed sit<>stands x 5 throughout session using parallel bars/RW while maintaining TTWB precautions on L LE. Using the parallel bars pt worked on hopping on [place] in place in order to maintain weightbearing precautions on L LE. Pt ambulated x 15 ft in parallel bars only maintaining precautions 70% of the time, verbal cues from therapist for feedback and technique. Pt standing in parallel bars performed x 10 single leg mini squats on R LE with UE support for LE strengthening. Pt transferred from w/c<>mat, stand pivot transfer using RW and min assist, TTWB maintained 100%. Pt transferred sitting<>supine with supervision. Pt performed supine therex for strengthening: 2 x 10 hip abduction B, 2 x 10 SLR B, R single leg bridges 2 x 5, 2 x 10 heel slides B, 2 x 10 SAQ B. Pt performed 2 x 30 sec piriformis stretch on the R, therapist performed B hamstring stretch. Pt transferred mat>w/c with poor adherence to precautions secondary to fatigue. Pt propelled w/c from gym to room with supervision, left seated in w/c with needs in reach.   Session 2: pt seated in w/c upon PT arrival, agreeable to therapy tx and reports hip pain 2/10. Pt propelled w/c from room<>gym x150 ft each  direction, using B UEs and R LE with supervision. Pt worked on [employer] of squat pivot transfers from w/c<>mat x 8 with emphasis on maintaining precautions, min assist. Discussed doing squat pivot transfer vs stand pivot in order to maintain precautions. Pt transferred sitting<>supine on the mat. In supine pt performed therex:  2 x 10 hip abduction B, 2 x 10 SLR B, R single leg bridges 2 x 5, 2 x 10 heel slides B, 2 x 10 SAQ B. Pt performed 2 x 30 sec piriformis stretch on the R. Pt transferred from mat>w/c, min assist squat pivot. Pt propelled w/c x 150 ft from gym>dayroom using B UEs and R LE with supervision. Pt used arm bike x 6 minutes for endurance and activity tolerance. Pt requesting to perform more UE exercises since her LE's feel "sore." Pt seated in w/c performed 1 x 10 of each with 2# weight: shoulder flexion, shoulder abduction, bicep curls, and shoulder press. Pt performed 2 x 10 LAQ with 3# ankle weight. Pt transported back to room in w/c and transferred from w/c>bed. Pt left supine in bed with needs in reach.   Therapy Documentation Precautions:  Precautions Precautions: Fall Restrictions Weight Bearing Restrictions: Yes LLE Weight Bearing: Touchdown weight bearing   See Function Navigator for Current Functional Status.   Therapy/Group: Individual Therapy  Netta Corrigan, PT, DPT 12/28/2016, 8:31 AM

## 2016-12-28 NOTE — Progress Notes (Signed)
Occupational Therapy Session Note  Patient Details  Name: Stacy Morris MRN: [id number] Date of Birth: Aug 16, 1933  Today's Date: 12/28/2016 OT Individual Time: 1300-1359 OT Individual Time Calculation (min): 59 min    Short Term Goals: Week 1:  OT Short Term Goal 1 (Week 1): Pt will complete sit<stand for LB dressing with Min A while adhering to precautions OT Short Term Goal 2 (Week 1): Pt will complete toilet or BSC transfer with Mod A and LRAD OT Short Term Goal 3 (Week 1): Pt will don footwear with Mod A and AE as needed  Skilled Therapeutic Interventions/Progress Updates:    Pt rolled her wheelchair down to the dayroom with supervision and increased time.  One rest break needed once she reached the last door to the therapy gym.  Once in the day room, had pt complete 3 sets of 3 mins on the UE ergonometer with resistance at level 12 and RPMs maintained at 23-26.  She needed 2-3 minute rest breaks between sets with O2 sats maintained at 98% on room air but HR increasing to 126 BPM immediately post exercise.  Next had pt work on sit to stand and stepping with the RW, while maintaining TDWBing status.  She needed min assist to complete sit to stand, adhering to weightbearing precautions.  She was also able to take 3 small steps adhering to weightbearing with mod instructional cuieng for sequencing walker and stepping first with the LLE.  After these initial steps she was not able to continue following weightbearing and the force guard would beep.  Therapist would then have her sit.  Completed this sequence times two.  Finished exercise with 2 sets of 6 repetitions for wheelchair pushups.  She needed min assist for correct form.  Returned pt to room via wheelchair and placed her at bedside for next session.  Call button and phone in reach.       Therapy Documentation Precautions:  Precautions Precautions: Fall Restrictions Weight Bearing Restrictions: Yes LLE Weight Bearing: Touchdown  weight bearing  Pain: Pain Assessment Pain Assessment: No/denies pain Pain Score: 2   ADL: See Function Navigator for Current Functional Status.   Therapy/Group: Individual Therapy  Trenese Haft OTR/L 12/28/2016, 2:29 PM

## 2016-12-28 NOTE — Progress Notes (Signed)
Occupational Therapy Session Note  Patient Details  Name: Stacy Morris MRN: [id number] Date of Birth: 1933-08-22  Today's Date: 12/28/2016 OT Individual Time: 0930-1015 OT Individual Time Calculation (min): 45 min    Short Term Goals: Week 1:  OT Short Term Goal 1 (Week 1): Pt will complete sit<stand for LB dressing with Min A while adhering to precautions OT Short Term Goal 2 (Week 1): Pt will complete toilet or BSC transfer with Mod A and LRAD OT Short Term Goal 3 (Week 1): Pt will don footwear with Mod A and AE as needed  Skilled Therapeutic Interventions/Progress Updates:    1:1 Self care retraining at shower level. Stand pivot with grab bars into shower stall with zero entry with mod A to maintain weight bearing status. Pt able to bathe peri area and buttcks with lateral leans. Pt with increased left lower LE swelling. Pt with success threading underwear and pants but did require steadying A and A to pull up pants to maintain PWB. Once sit up at the sink pt able to do hair, don makeup and brush teeth. Left with nursing students.   Therapy Documentation Precautions:  Precautions Precautions: Fall Restrictions Weight Bearing Restrictions: Yes LLE Weight Bearing: Touchdown weight bearing Pain: Pain Assessment Pain Assessment: No/denies pain Pain Score: 2  in right hip- requested pain meds prior to next therapy session  ADL: ADL ADL Comments: Please see functional navigator for ADL status  See Function Navigator for Current Functional Status.   Therapy/Group: Individual Therapy  Willeen Cass [facility] 12/28/2016, 3:09 PM

## 2016-12-29 ENCOUNTER — Inpatient Hospital Stay (HOSPITAL_COMMUNITY): Payer: Medicare Other | Admitting: Occupational Therapy

## 2016-12-29 ENCOUNTER — Inpatient Hospital Stay (HOSPITAL_COMMUNITY): Payer: Medicare Other | Admitting: Physical Therapy

## 2016-12-29 ENCOUNTER — Encounter (HOSPITAL_COMMUNITY): Payer: Self-pay

## 2016-12-29 MED ORDER — MENTHOL 3 MG MT LOZG
1.0000 | LOZENGE | OROMUCOSAL | Status: DC | PRN
  Filled 2016-12-29 (×2): qty 9

## 2016-12-29 MED ORDER — LOPERAMIDE HCL 2 MG PO CAPS
2.0000 mg | ORAL_CAPSULE | ORAL | Status: DC | PRN
  Administered 2016-12-29 – 2017-01-04 (×4): 2 mg via ORAL
  Filled 2016-12-29 (×4): qty 1

## 2016-12-29 MED ORDER — DIAZEPAM 5 MG PO TABS
ORAL_TABLET | ORAL | Status: AC
  Administered 2016-12-29: 5 mg
  Filled 2016-12-29: qty 1

## 2016-12-29 MED ORDER — LOSARTAN POTASSIUM 25 MG PO TABS
25.0000 mg | ORAL_TABLET | Freq: Every day | ORAL | Status: DC
  Administered 2016-12-29 – 2016-12-30 (×2): 25 mg via ORAL
  Filled 2016-12-29 (×2): qty 1

## 2016-12-29 NOTE — Progress Notes (Addendum)
Physical Therapy Session Note  Patient Details  Name: Stacy Morris MRN: [id number] Date of Birth: 08/18/1933  Today's Date: 12/29/2016 PT Individual Time: 1005-1100 AND 1515-1615 PT Individual Time Calculation (min): 55 min AND 60 min  Short Term Goals: Week 1:  PT Short Term Goal 1 (Week 1): Pt will perform stand pivot transfers with min assist and LRAD PT Short Term Goal 2 (Week 1): Pt will ambulate x 50 ft with LRAD and maintaining TTWB on L LE with min assist PT Short Term Goal 3 (Week 1): Pt will perform bed mobility with supervision  Skilled Therapeutic Interventions/Progress Updates:   Session 1:  Pt in w/c upon arrival and agreeable to therapy, no c/o pain. Worked on functional mobility and LE strengthening this session. Total A w/c mobility to gym for time management. Transferred to/from mat w/ Mod A using RW and multimodal cues to maintain TTWB on LLE. Performed LE strengthening exercises as detailed below. Returned to room in w/c as pt needed to toilet. Transferred to/from toilet via stand pivot w/o RW and Mod A using rails in bathroom. Pt performed multiple sit<>stands for pericare and RN present looking at skin. Total A w/c mobility and stand pivot transfer to EOB and transferred to supine w/ supervision. Provided with heat pack for R hip soreness. RN made aware of status, ended session supine, call bell within reach and all needs met.   BUE/LE strengthening exercises: -sit<>stands from elevated surface, 2x5 -L LAQs 2x15 -L heel slides 2x20 -partial R knee and UE bend while standing w/ RW, 1x10, 1x5 -R heel raises in standing 1x10   Session 2:  Pt in w/c upon arrival and agreeable to therapy, no c/o pain. Total A w/c transport to/from gym for time management. Pt reports heat on R hip helped w/ pain and soreness earlier in day. Worked on functional strength in parallel bars while performing multiple sit<>stands w/ Mod A, RLE single leg bends w/ UE support to work on  functional RLE strength for stepping and stepping forward and back w/ RLE. Pt able to maintain TTWB precautions ~50% of trials 2/2 fatigue. Multiple seated rest breaks 2/2 fatigue and performed LLE strengthening exercises during rest as listed below. Returned to room and ended session in w/c, call bell within reach and all needs met. Provided w/ heat pack for R hip soreness, RN made aware.   LLE strengthening exercises: -heel raises 2x20 -toe raises 2x20 -LAQs 2x20 -heel slides 2x20   Therapy Documentation Precautions:  Precautions Precautions: Fall Restrictions Weight Bearing Restrictions: Yes LLE Weight Bearing: Touchdown weight bearing Pain: Pain Assessment Pain Assessment: 0-10 Pain Score: 4  Pain Location: Hip Pain Orientation: Right Pain Descriptors / Indicators: Aching Patients Stated Pain Goal: 2 Pain Intervention(s): Medication (See eMAR)  See Function Navigator for Current Functional Status.   Therapy/Group: Individual Therapy  Kaizlee Carlino K Arnette 12/29/2016, 12:41 PM

## 2016-12-29 NOTE — Progress Notes (Signed)
Occupational Therapy Session Note  Patient Details  Name: Stacy Morris MRN: [id number] Date of Birth: [birth year]  Today's Date: 12/29/2016 OT Individual Time: 0830-0900 OT Individual Time Calculation (min): 30 min    Short Term Goals: Week 1:  OT Short Term Goal 1 (Week 1): Pt will complete sit<stand for LB dressing with Min A while adhering to precautions OT Short Term Goal 2 (Week 1): Pt will complete toilet or BSC transfer with Mod A and LRAD OT Short Term Goal 3 (Week 1): Pt will don footwear with Mod A and AE as needed  Skilled Therapeutic Interventions/Progress Updates:    1:1 Focus on toilet transfer with RW with min A with therapist's foot under her left foot to monitor weight bearing. Pt able to pivot with min A. Pt also performed toileting with steady A. Pt able to pull up pants and underwear with steadying A . TEDS donned to A with lower LEs swelling and trial of shoe on right foot.  In gym donned force guard to monitor weight bearing during 5 sit to stands and 2 transfers with RW with min A and VC for pushing weight through UB. Pt able to maintain weight bearing status correctly 80 % of the time. Returned to room with heat applied to right hip for discomfort.   Therapy Documentation Precautions:  Precautions Precautions: Fall Restrictions Weight Bearing Restrictions: Yes LLE Weight Bearing: Touchdown weight bearing Pain: Pain Assessment Pain Assessment: 0-10 Pain Score: 4  Pain Location: Hip Pain Orientation: Right Pain Descriptors / Indicators: Aching Patients Stated Pain Goal: 2 Pain Intervention(s): Medication (See eMAR) heat applied to right hip ADL: ADL ADL Comments: Please see functional navigator for ADL status Other Treatments:    See Function Navigator for Current Functional Status.   Therapy/Group: Individual Therapy  Willeen Cass [facility] 12/29/2016, 10:47 AM

## 2016-12-29 NOTE — Progress Notes (Signed)
Subjective/Complaints: Pt seen laying in bed this AM.  She slept well overnight, but states that her left leg is sore this AM.    Review of systems:  Denies chest pain, shortness of breath, nausea vomiting, diarrhea.  Objective: Vital Signs: Blood pressure (!) 160/65, pulse 87, temperature 97.9 F (36.6 C), temperature source Oral, resp. rate 18, height 5\' 7"  (1.702 m), weight 104 kg (229 lb 4.5 oz), SpO2 98 %. No results found. No results found for this or any previous visit (from the past 72 hour(s)).  General: NAD. Vital signs reviewed.  Heart: RRR. No JVD. Lungs: Clear to auscultation, breathing unlabored Abdomen: Positive bowel sounds, nondistended Musculoskeletal: LLE edema and mild tenderness Neurologic:  Motor: 5/5 in bilateral deltoid, bicep, tricep, grip,  5/5 right hip flexor, knee extensors, ankle dorsiflexor and plantar flexor 4+/5 left hip flexors, knee extensors, 5/5 ADF/PF (stable) Skin: Intact. Warm and dry.   Assessment/Plan: 1. Functional deficits secondary to left periprosthetic distal femur fracture which require 3+ hours per day of interdisciplinary therapy in a comprehensive inpatient rehab setting. Physiatrist is providing close team supervision and 24 hour management of active medical problems listed below. Physiatrist and rehab team continue to assess barriers to discharge/monitor patient progress toward functional and medical goals. FIM: Function - Bathing Position: Shower Body parts bathed by patient: Right arm, Left arm, Chest, Abdomen, Front perineal area, Buttocks, Right upper leg, Left upper leg, Right lower leg Body parts bathed by helper: Left lower leg, Back Assist Level: Set up  Function- Upper Body Dressing/Undressing What is the patient wearing?: Bra, Pull over shirt/dress Bra - Perfomed by patient: Thread/unthread right bra strap, Thread/unthread left bra strap, Hook/unhook bra (pull down sports bra) Pull over shirt/dress - Perfomed by  patient: Thread/unthread right sleeve, Thread/unthread left sleeve, Put head through opening, Pull shirt over trunk Assist Level: Set up Function - Lower Body Dressing/Undressing What is the patient wearing?: Underwear, Pants, Non-skid slipper socks Position: Wheelchair/chair at sink Underwear - Performed by patient: Thread/unthread right underwear leg, Thread/unthread left underwear leg Underwear - Performed by helper: Pull underwear up/down Pants- Performed by patient: Thread/unthread right pants leg, Thread/unthread left pants leg Pants- Performed by helper: Pull pants up/down Non-skid slipper socks- Performed by helper: Don/doff right sock, Don/doff left sock TED Hose - Performed by helper: Don/doff right TED hose, Don/doff left TED hose Assist for footwear: Maximal assist Assist for lower body dressing: Touching or steadying assistance (Pt > 75%)  Function - Toileting Toileting activity did not occur: No continent bowel/bladder event Toileting steps completed by patient: Performs perineal hygiene, Adjust clothing prior to toileting, Adjust clothing after toileting Toileting steps completed by helper: Adjust clothing after toileting Toileting Assistive Devices: Grab bar or rail Assist level: Touching or steadying assistance (Pt.75%)  Function - Air cabin crew transfer activity did not occur: Safety/medical concerns Toilet transfer assistive device: Elevated toilet seat/BSC over toilet Assist level to toilet: Maximal assist (Pt 25 - 49%/lift and lower) Assist level from toilet: Maximal assist (Pt 25 - 49%/lift and lower) Assist level to bedside commode (at bedside): Maximal assist (Pt 25 - 49%/lift and lower) Assist level from bedside commode (at bedside): Maximal assist (Pt 25 - 49%/lift and lower)  Function - Chair/bed transfer Chair/bed transfer method: Stand pivot Chair/bed transfer assist level: Touching or steadying assistance (Pt > 75%) Chair/bed transfer assistive  device: Environmental consultant, Armrests  Function - Locomotion: Wheelchair Type: Manual Max wheelchair distance: 11ft  Assist Level: Supervision or verbal cues Assist Level: Supervision or  verbal cues Wheel 150 feet activity did not occur: Safety/medical concerns Turns around,maneuvers to table,bed, and toilet,negotiates 3% grade,maneuvers on rugs and over doorsills: No Function - Locomotion: Ambulation Assistive device: Walker-rolling Max distance: 15 ft Assist level: Touching or steadying assistance (Pt > 75%) Assist level: Touching or steadying assistance (Pt > 75%) Walk 50 feet with 2 turns activity did not occur: Safety/medical concerns Walk 150 feet activity did not occur: Safety/medical concerns Walk 10 feet on uneven surfaces activity did not occur: Safety/medical concerns(per report)  Function - Comprehension Comprehension: Auditory Comprehension assistive device: Hearing aids Comprehension assist level: Set up assist with hearing device, Follows basic conversation/direction with no assist  Function - Expression Expression: Verbal Expression assist level: Expresses basic needs/ideas: With extra time/assistive device  Function - Social Interaction Social Interaction assist level: Interacts appropriately 90% of the time - Needs monitoring or encouragement for participation or interaction.  Function - Problem Solving Problem solving assist level: Solves basic 90% of the time/requires cueing < 10% of the time  Function - Memory Memory assist level: Recognizes or recalls 90% of the time/requires cueing < 10% of the time Patient normally able to recall (first 3 days only): Current season, Staff names and faces, That he or she is in a hospital   Medical Problem List and Plan: 1.  Decreased functional mobility with gait disorder and impairment of ADLs secondary to left supracondylar fracture of the femur s/p ORIF 12/20/2016. Touchdown weightbearing   Cont CIR, patient okay with planned  discharge date 2.  DVT Prophylaxis/Anticoagulation: Subcutaneous Lovenox.    Vascular study neg for DVT 3. Pain Management: Oxycodone as needed   Controlled at present 4. Mood: Celexa 40 mg daily, Xanax 0.5 mg 3 times a day as needed 5. Neuropsych: This patient is capable of making decisions on her own behalf. 6. Skin/Wound Care: Routine skin checks 7. Fluids/Electrolytes/Nutrition: Routine I&O's 8. Acute blood loss anemia.    Hb 8.9 on 11/12   Cont to monitor 9. CKD stage IV.    Cr. 1.87 on 11/12   Cont to monitor 10. Hypothyroidism. Continue Synthroid 11. Rheumatoid arthritis. Continue chronic prednisone 5 mg daily. Patient receiving Enbrel 50 mg weekly 12. History of gout. Patient receiving allopurinol daily 13. Constipation. Laxative assistance 14. HTN    On 4 meds at home, norvasc cozaar and tenormin are listed   Restarted norvasc 5mg  11/11, increased to 10mg  on 11/13   Cozaar 25 started on 11/15  Vitals:   12/28/16 1556 12/29/16 0621  BP: (!) 139/56 (!) 160/65  Pulse: 97 87  Resp: 16 18  Temp: 98.8 F (37.1 C) 97.9 F (36.6 C)  SpO2: 98% 98%   LOS (Days) 6 A FACE TO FACE EVALUATION WAS PERFORMED  Ankit Lorie Phenix 12/29/2016, 8:04 AM

## 2016-12-29 NOTE — Progress Notes (Signed)
Social Work Patient ID: Stacy Morris, female   DOB: 02/21/1933, 83 y.o.   MRN: [id number]  Spoke with daughter-Debby via telephone to discuss team conference and the plan. She needs to know how long pt will be TDWB she was suppose to see ortho MD in two weeks which is next Tuesday with an x-ray being done. Will give daughter a list of agencies to hire assist and ask if x-ray could be done here since pt is here until next Sat. Pt wants to go home from here. Discussed if NH wanted to be pursued there is not guarenteed covered by [facility]. Will work on discharge plans.

## 2016-12-29 NOTE — Plan of Care (Signed)
Denies any pain continent of bladder

## 2016-12-29 NOTE — Plan of Care (Signed)
Pt continent of bowel and bladder wheelchair with 2 assist for mobility. Pt obtains relief with analgesics as prescribed

## 2016-12-29 NOTE — Progress Notes (Signed)
Occupational Therapy Session Note  Patient Details  Name: Stacy Morris MRN: [id number] Date of Birth: 06/22/1933  Today's Date: 12/29/2016 OT Individual Time: 1300-1400 OT Individual Time Calculation (min): 60 min    Short Term Goals: Week 1:  OT Short Term Goal 1 (Week 1): Pt will complete sit<stand for LB dressing with Min A while adhering to precautions OT Short Term Goal 2 (Week 1): Pt will complete toilet or BSC transfer with Mod A and LRAD OT Short Term Goal 3 (Week 1): Pt will don footwear with Mod A and AE as needed  Skilled Therapeutic Interventions/Progress Updates:    Pt completed toileting and toilet transfer to start the session.  She was able to perform both at min assist level using the RW and 3:1.  Next took pt down to the therapy gym for work on sit to stand transitions and stepping, while adhering to weightbearing precautions.  She was able to complete several sit to stand transitions, maintaining TDWBing precautions on the LLE.  Once standing she was able to maintain balance with min assist while engaged in [place].  She attempted taking 2-3 steps forward and backwards with use of the walker and force guard on the LLE as well.  She was only able to successfully maintain weightbearing for 25% of steps.  Returned to room at end of session via wheelchair with pt placed at bedside, in reach of call button and phone.    Therapy Documentation Precautions:  Precautions Precautions: Fall Restrictions Weight Bearing Restrictions: Yes LLE Weight Bearing: Touchdown weight bearing  Pain: Pain Assessment Pain Assessment: No/denies pain ADL: See Function Navigator for Current Functional Status.   Therapy/Group: Individual Therapy  Dona Klemann OTR/L 12/29/2016, 4:17 PM

## 2016-12-30 ENCOUNTER — Inpatient Hospital Stay (HOSPITAL_COMMUNITY): Payer: Medicare Other | Admitting: Physical Therapy

## 2016-12-30 ENCOUNTER — Inpatient Hospital Stay (HOSPITAL_COMMUNITY): Payer: Medicare Other | Admitting: Occupational Therapy

## 2016-12-30 ENCOUNTER — Encounter (HOSPITAL_COMMUNITY): Payer: Self-pay

## 2016-12-30 DIAGNOSIS — D62 Acute posthemorrhagic anemia: Secondary | ICD-10-CM

## 2016-12-30 LAB — CREATININE, SERUM
Creatinine, Ser: 2.17 mg/dL — ABNORMAL HIGH (ref 0.44–1.00)
GFR calc non Af Amer: 20 mL/min — ABNORMAL LOW (ref >60)
GFR, EST AFRICAN AMERICAN: 23 mL/min — AB (ref >60)

## 2016-12-30 NOTE — Progress Notes (Signed)
Physical Therapy Session Note  Patient Details  Name: Stacy Morris MRN: [id number] Date of Birth: Jun 08, 1933  Today's Date: 12/30/2016 PT Individual Time: 3762-8315 AND 1515-1555 PT Individual Time Calculation (min): 57 min AND 40 min  Short Term Goals: Week 1:  PT Short Term Goal 1 (Week 1): Pt will perform stand pivot transfers with min assist and LRAD PT Short Term Goal 2 (Week 1): Pt will ambulate x 50 ft with LRAD and maintaining TTWB on L LE with min assist PT Short Term Goal 3 (Week 1): Pt will perform bed mobility with supervision  Skilled Therapeutic Interventions/Progress Updates:   Session 1:  Pt supine upon arrival and agreeable to therapy, no c/o pain. Pt transferred to EOB w/ supervision and to w/c via lateral scoot w/ Min guard and use of high/low bed features. Pt self-propelled w/c to/from gym w/ supervision using BUEs to work on endurance and independence w/ mobility. Worked on functional strength and stepping w/ UE support on RW this session. Pt performed multiple sit<>stands w/ Min A, UE raises from w/c for UE strength, and forward stepping and back w/ RLE. Pt able to take 4 steps while maintaining WB precautions, however unable after 2/2 UE weakness and fatigue w/ rising up on RW for foot clearance. Ambulated 5' w/ RW, however unable to consistently maintain precautions, will continue to work on global strength for maintaining WB precautions in LLE. Returned to room and performed w/c<>toilet transfer w/ Mod A for sit<>stands, transfer, and supervision w/ pericare. Ended session in w/c w/ hot pack on R lateral hip for soreness, call bell within reach and all needs met.   Session 2:  Pt in w/c upon arrival and agreeable to therapy, no c/o pain. Worked on functional mobility and functional transfers ADL apartment bed<>chair and bed<>bedside commode to simulate home environment. Pt performed transfers w/ Min guard via lateral scoot w/ verbal cues to adhere to LLE WB precautions  during transfer. Returned to EOB after toileting and worked on LLE strengthening in stance while performing multiple sit<>stand exercises in between bouts. Performed L 3-way hip 3x10 and L knee marches 2x10. All exercises performed w/ bilateral UE support on RW and Min guard form therapist. Ended session in supine, call bell within reach and all needs met. Hot pack given for R hip soreness and RN made aware.  Therapy Documentation Precautions:  Precautions Precautions: Fall Restrictions Weight Bearing Restrictions: Yes LLE Weight Bearing: Touchdown weight bearing Vital Signs: Therapy Vitals Temp: 97.9 F (36.6 C) Temp Source: Oral Pulse Rate: 96 Resp: 18 BP: (!) 159/63 Patient Position (if appropriate): Lying Oxygen Therapy SpO2: 97 % O2 Device: Not Delivered Pain: Pain Assessment Pain Assessment: 0-10 Pain Score: 2  Pain Location: Knee Pain Orientation: Left Pain Descriptors / Indicators: Aching Patients Stated Pain Goal: 2 Pain Intervention(s): Medication (See eMAR)  See Function Navigator for Current Functional Status.   Therapy/Group: Individual Therapy  Peytan Andringa K Arnette 12/30/2016, 10:15 AM

## 2016-12-30 NOTE — Progress Notes (Signed)
Occupational Therapy Session Note  Patient Details  Name: Stacy Morris MRN: [id number] Date of Birth: 04/15/1933  Today's Date: 12/30/2016 OT Individual Time: 1030-1100 OT Individual Time Calculation (min): 30 min    Short Term Goals: Week 1:  OT Short Term Goal 1 (Week 1): Pt will complete sit<stand for LB dressing with Min A while adhering to precautions OT Short Term Goal 2 (Week 1): Pt will complete toilet or BSC transfer with Mod A and LRAD OT Short Term Goal 3 (Week 1): Pt will don footwear with Mod A and AE as needed Week 2:     Skilled Therapeutic Interventions/Progress Updates:    1:1 Self care retraining at shower level. Pt able to progress today to contact guard to perform sit to stands today and min A for stand pivot transfers in and out of the zero entry shower. Pt able to shower sit to stand in shower with min A for steady A to maintain weight bearing status. Due to fatigue A pt with hooking bra and adjusting pants with pulling them up. Pt still with difficulty picking up clothes on floor after doffing and donning socks and shoes- will plan to introduce AE. LEft up at sink to perform grooming mod I.  Therapy Documentation Precautions:  Precautions Precautions: Fall Restrictions Weight Bearing Restrictions: Yes LLE Weight Bearing: Touchdown weight bearing Pain:  reports of sore right hip but prior to session pt had been applying heat ADL: ADL ADL Comments: Please see functional navigator for ADL status  See Function Navigator for Current Functional Status.   Therapy/Group: Individual Therapy  Willeen Cass [facility] 12/30/2016, 1:32 PM

## 2016-12-30 NOTE — Progress Notes (Signed)
Subjective/Complaints: Pt seen laying in bed this AM.  She slept well overnight. She states she is feeling much better and believes she could do a lot more if she did not have any weight bearing restrictions.   Review of systems:  Denies chest pain, shortness of breath, nausea vomiting, diarrhea.  Objective: Vital Signs: Blood pressure (!) 159/63, pulse 96, temperature 97.9 F (36.6 C), temperature source Oral, resp. rate 18, height '5\' 7"'$  (1.702 m), weight 104 kg (229 lb 4.5 oz), SpO2 97 %. No results found. Results for orders placed or performed during the hospital encounter of 12/23/16 (from the past 72 hour(s))  Creatinine, serum     Status: Abnormal   Collection Time: 12/30/16  4:58 AM  Result Value Ref Range   Creatinine, Ser 2.17 (H) 0.44 - 1.00 mg/dL   GFR calc non Af Amer 20 (L) >60 mL/min   GFR calc Af Amer 23 (L) >60 mL/min    Comment: (NOTE) The eGFR has been calculated using the CKD EPI equation. This calculation has not been validated in all clinical situations. eGFR's persistently <60 mL/min signify possible Chronic Kidney Disease.     General: NAD. Vital signs reviewed.  Heart: RRR. No JVD. Lungs: Clear to auscultation, breathing Unlabored Abdomen: Positive bowel sounds, nondistended Musculoskeletal: LLE edema and mild tenderness Neurologic:  Motor: 5/5 in bilateral deltoid, bicep, tricep, grip,  5/5 right hip flexor, knee extensors, ankle dorsiflexor and plantar flexor 4+/5 left hip flexors, knee extensors, 5/5 ADF/PF (unchanged) Skin: Intact. Warm and dry.   Assessment/Plan: 1. Functional deficits secondary to left periprosthetic distal femur fracture which require 3+ hours per day of interdisciplinary therapy in a comprehensive inpatient rehab setting. Physiatrist is providing close team supervision and 24 hour management of active medical problems listed below. Physiatrist and rehab team continue to assess barriers to discharge/monitor patient progress  toward functional and medical goals. FIM: Function - Bathing Position: Shower Body parts bathed by patient: Right arm, Left arm, Chest, Abdomen, Front perineal area, Buttocks, Right upper leg, Left upper leg, Right lower leg Body parts bathed by helper: Left lower leg, Back Assist Level: Set up  Function- Upper Body Dressing/Undressing What is the patient wearing?: Bra, Pull over shirt/dress Bra - Perfomed by patient: Thread/unthread right bra strap, Thread/unthread left bra strap, Hook/unhook bra (pull down sports bra) Pull over shirt/dress - Perfomed by patient: Thread/unthread right sleeve, Thread/unthread left sleeve, Put head through opening, Pull shirt over trunk Assist Level: Set up Function - Lower Body Dressing/Undressing What is the patient wearing?: Underwear, Pants, Non-skid slipper socks Position: Wheelchair/chair at sink Underwear - Performed by patient: Thread/unthread right underwear leg, Thread/unthread left underwear leg Underwear - Performed by helper: Pull underwear up/down Pants- Performed by patient: Thread/unthread right pants leg, Thread/unthread left pants leg Pants- Performed by helper: Pull pants up/down Non-skid slipper socks- Performed by helper: Don/doff right sock, Don/doff left sock TED Hose - Performed by helper: Don/doff right TED hose, Don/doff left TED hose Assist for footwear: Maximal assist Assist for lower body dressing: Touching or steadying assistance (Pt > 75%)  Function - Toileting Toileting activity did not occur: No continent bowel/bladder event Toileting steps completed by patient: Adjust clothing prior to toileting, Performs perineal hygiene, Adjust clothing after toileting Toileting steps completed by helper: Adjust clothing after toileting Toileting Assistive Devices: Grab bar or rail Assist level: Touching or steadying assistance (Pt.75%)  Function - Air cabin crew transfer activity did not occur: Safety/medical  concerns Toilet transfer assistive device: Elevated toilet  seat/BSC over toilet Assist level to toilet: Touching or steadying assistance (Pt > 75%) Assist level from toilet: Touching or steadying assistance (Pt > 75%) Assist level to bedside commode (at bedside): Maximal assist (Pt 25 - 49%/lift and lower) Assist level from bedside commode (at bedside): Maximal assist (Pt 25 - 49%/lift and lower)  Function - Chair/bed transfer Chair/bed transfer method: Stand pivot Chair/bed transfer assist level: Moderate assist (Pt 50 - 74%/lift or lower) Chair/bed transfer assistive device: Walker, Armrests  Function - Locomotion: Wheelchair Type: Manual Max wheelchair distance: 153f  Assist Level: Supervision or verbal cues Assist Level: Supervision or verbal cues Wheel 150 feet activity did not occur: Safety/medical concerns Turns around,maneuvers to table,bed, and toilet,negotiates 3% grade,maneuvers on rugs and over doorsills: No Function - Locomotion: Ambulation Assistive device: Walker-rolling Max distance: 15 ft Assist level: Touching or steadying assistance (Pt > 75%) Assist level: Touching or steadying assistance (Pt > 75%) Walk 50 feet with 2 turns activity did not occur: Safety/medical concerns Walk 150 feet activity did not occur: Safety/medical concerns Walk 10 feet on uneven surfaces activity did not occur: Safety/medical concerns(per report)  Function - Comprehension Comprehension: Auditory Comprehension assistive device: Hearing aids Comprehension assist level: Follows basic conversation/direction with no assist, Set up assist with hearing device  Function - Expression Expression: Verbal Expression assist level: Expresses basic needs/ideas: With extra time/assistive device  Function - Social Interaction Social Interaction assist level: Interacts appropriately 90% of the time - Needs monitoring or encouragement for participation or interaction.  Function - Problem  Solving Problem solving assist level: Solves basic 90% of the time/requires cueing < 10% of the time  Function - Memory Memory assist level: Recognizes or recalls 90% of the time/requires cueing < 10% of the time Patient normally able to recall (first 3 days only): Current season, Staff names and faces, That he or she is in a hospital   Medical Problem List and Plan: 1.  Decreased functional mobility with gait disorder and impairment of ADLs secondary to left supracondylar fracture of the femur s/p ORIF 12/20/2016. Touchdown weightbearing   Cont CIR, patient okay with planned discharge date 2.  DVT Prophylaxis/Anticoagulation: Subcutaneous Lovenox.    Vascular study neg for DVT 3. Pain Management: Oxycodone as needed   Controlled at present 4. Mood: Celexa 40 mg daily, Xanax 0.5 mg 3 times a day as needed 5. Neuropsych: This patient is capable of making decisions on her own behalf. 6. Skin/Wound Care: Routine skin checks 7. Fluids/Electrolytes/Nutrition: Routine I&O's 8. Acute blood loss anemia.    Hb 8.9 on 11/12   Labs ordered for Monday   Cont to monitor 9. CKD stage IV.    Cr. 2.17 on 11/16   Labs ordered for Monday   Encourage fluids   Cont to monitor 10. Hypothyroidism. Continue Synthroid 11. Rheumatoid arthritis. Continue chronic prednisone 5 mg daily. Patient receiving Enbrel 50 mg weekly 12. History of gout. Patient receiving allopurinol daily 13. Constipation. Laxative assistance 14. HTN    On 4 meds at home, norvasc cozaar and tenormin are listed   Restarted norvasc 587m11/11, increased to 1024mn 11/13   Cozaar 25 started on 11/15   Remains elevated, will increase further tomorrow Vitals:   12/29/16 1417 12/30/16 0630  BP: (!) 142/52 (!) 159/63  Pulse: (!) 103 96  Resp: 18 18  Temp: 98 F (36.7 C) 97.9 F (36.6 C)  SpO2: 98% 97%   LOS (Days) 7 A FACE TO FACE EVALUATION WAS PERFORMED  Ankit Lorie Phenix 12/30/2016, 7:52 AM

## 2016-12-30 NOTE — Plan of Care (Signed)
Pt denies pain at present  Continent of bowel and bladder Rash on buttocks improving.

## 2016-12-30 NOTE — Progress Notes (Signed)
Occupational Therapy Weekly Progress Note  Patient Details  Name: Stacy Morris MRN: [id number] Date of Birth: September 21, 1933  Beginning of progress report period: December 24, 2016 End of progress report period: December 30, 2016  Today's Date: 12/30/2016 OT Individual Time: 7893-8101 OT Individual Time Calculation (min): 58 min    Patient has met 2 of 3 short term goals.  [name] continues to make steady progress with OT and currently completes UB selfcare at a supervision level and LB selfcare at a mod assist level overall.  Her biggest barrier at this time is maintaining her TDWBing thru the LLE during functional transfers.  She can complete 75% of sit to stand transitions adhering to weightbearing precautions but only 25% of her steps with stand pivot transfers are correct.  Feel at this time that she may have to complete more squat pivot transfers or scooting in order to adhere to weightbearing restrictions, unless the MD changes the orders.  Recommend continued comprehensive OT in order to increase overall independence with basic selfcare tasks.  Feel pt will need initial supervision for functional transfers at home and 24 hour supervision.  She is going to work on arranging assist.    Patient continues to demonstrate the following deficits: muscle weakness and decreased standing balance, decreased balance strategies and difficulty maintaining precautions and therefore will continue to benefit from skilled OT intervention to enhance overall performance with BADL and Reduce care partner burden.  Patient not progressing toward long term goals.  See goal revision..  Plan of care revisions: goal downgrade.  OT Short Term Goals Week 2:  OT Short Term Goal 1 (Week 2): Continue working toward supervision to min assist level goals for discharge.   Skilled Therapeutic Interventions/Progress Updates:    Pt completed wheelchair mobility to the therapy gym with supervision.  She then transferred to  the therapy mat with min assist stand pivot with use of the RW.  She demonstrated decreased ability to maintain TDWBing through the LLE during transfer as force guard beeped with 2/3 pivot steps.  Once sitting on the therapy mat, she completed UE exercises using 5 lb dowel rod for bilateral shoulder flexion, 2 sets of 10 reps.  Also had her work on overhead reach bilaterally with 2.5 lb wrist weights on each arm.  She completed 2 sets of 20 repetitions.  She completed ball toss as well with emphasis on shoulder flexion to toss the ball back to the therapist, still having bilateral wrist weights in place.  Finished session with return back to the room via wheelchair.    Therapy Documentation Precautions:  Precautions Precautions: Fall Restrictions Weight Bearing Restrictions: Yes LLE Weight Bearing: Touchdown weight bearing  Pain: Pain Assessment Pain Assessment: No/denies pain ADL: See Function Navigator for Current Functional Status.   Therapy/Group: Individual Therapy  Sheron Tallman  OTR/L 12/30/2016, 1:59 PM

## 2016-12-31 ENCOUNTER — Inpatient Hospital Stay (HOSPITAL_COMMUNITY): Payer: Medicare Other | Admitting: Physical Therapy

## 2016-12-31 ENCOUNTER — Encounter (HOSPITAL_COMMUNITY): Payer: Self-pay | Admitting: Emergency Medicine

## 2016-12-31 DIAGNOSIS — I10 Essential (primary) hypertension: Secondary | ICD-10-CM

## 2016-12-31 MED ORDER — LOSARTAN POTASSIUM 50 MG PO TABS
75.0000 mg | ORAL_TABLET | Freq: Every day | ORAL | Status: DC
  Administered 2016-12-31 – 2017-01-01 (×2): 75 mg via ORAL
  Filled 2016-12-31 (×2): qty 1

## 2016-12-31 NOTE — Progress Notes (Signed)
Physical Therapy Session Note  Patient Details  Name: Stacy Morris MRN: [id number] Date of Birth: 09-Aug-1933  Today's Date: 12/31/2016 PT Individual Time: 0915-1015 PT Individual Time Calculation (min): 60 min   Short Term Goals: Week 1:  PT Short Term Goal 1 (Week 1): Pt will perform stand pivot transfers with min assist and LRAD PT Short Term Goal 2 (Week 1): Pt will ambulate x 50 ft with LRAD and maintaining TTWB on L LE with min assist PT Short Term Goal 3 (Week 1): Pt will perform bed mobility with supervision  Skilled Therapeutic Interventions/Progress Updates:  Pt propelled w/c to and from gym with B Ues and S. Pt transferred w/c to mat, mat to w/c to R side with c/s to min guard and verbal cues. Pt transferred edge of mat to supine and supine to edge of mat with S and increased time. While on mat, treatment focused on LE strengthening, heel slides, hip abd/add, ad SAQs, 3 sets x 10 reps each. Pt returned to room and left sitting up in w/c with all needs within reach.   Therapy Documentation Precautions:  Precautions Precautions: Fall Restrictions Weight Bearing Restrictions: Yes LLE Weight Bearing: Touchdown weight bearing General:   Pain: Pt's c/o soreness L knee, no c/o pain.   See Function Navigator for Current Functional Status.   Therapy/Group: Individual Therapy  Dub Amis 12/31/2016, 11:45 AM

## 2016-12-31 NOTE — Progress Notes (Signed)
Subjective/Complaints: Patient seen lying in bed this morning. She states she slept well overnight. She states she needs to have a bowel movement this morning.  Review of systems:  Denies chest pain, shortness of breath, nausea vomiting, diarrhea.  Objective: Vital Signs: Blood pressure (!) 171/64, pulse 94, temperature 97.8 F (36.6 C), temperature source Oral, resp. rate 16, height _0  (1.702 m), weight 104 kg (229 lb 4.5 oz), SpO2 98 %. No results found. Results for orders placed or performed during the hospital encounter of 12/23/16 (from the past 72 hour(s))  Creatinine, serum     Status: Abnormal   Collection Time: 12/30/16  4:58 AM  Result Value Ref Range   Creatinine, Ser 2.17 (H) 0.44 - 1.00 mg/dL   GFR calc non Af Amer 20 (L) >60 mL/min   GFR calc Af Amer 23 (L) >60 mL/min    Comment: (NOTE) The eGFR has been calculated using the CKD EPI equation. This calculation has not been validated in all clinical situations. eGFR's persistently <60 mL/min signify possible Chronic Kidney Disease.     General: NAD. Vital signs reviewed.  Heart: RRR. No JVD. Lungs: Clear to auscultation, breathing unlabored Abdomen: Positive bowel sounds, nondistended Musculoskeletal: LLE edema and mild tenderness Neurologic:  Motor: 5/5 in bilateral deltoid, bicep, tricep, grip,  5/5 right hip flexor, knee extensors, ankle dorsiflexor and plantar flexor 4+/5 left hip flexors, knee extensors, 5/5 ADF/PF (stable) Skin: Intact. Warm and dry.   Assessment/Plan: 1. Functional deficits secondary to left periprosthetic distal femur fracture which require 3+ hours per day of interdisciplinary therapy in a comprehensive inpatient rehab setting. Physiatrist is providing close team supervision and 24 hour management of active medical problems listed below. Physiatrist and rehab team continue to assess barriers to discharge/monitor patient progress toward functional and medical goals. FIM: Function -  Bathing Position: Shower Body parts bathed by patient: Right arm, Left arm, Chest, Abdomen, Front perineal area, Buttocks, Right upper leg, Left upper leg, Right lower leg, Left lower leg Body parts bathed by helper: Back Assist Level: Touching or steadying assistance(Pt > 75%)  Function- Upper Body Dressing/Undressing What is the patient wearing?: Bra, Pull over shirt/dress Bra - Perfomed by patient: Thread/unthread right bra strap, Thread/unthread left bra strap Bra - Perfomed by helper: Hook/unhook bra (pull down sports bra) Pull over shirt/dress - Perfomed by patient: Thread/unthread right sleeve, Thread/unthread left sleeve, Put head through opening, Pull shirt over trunk Assist Level: Set up Function - Lower Body Dressing/Undressing What is the patient wearing?: Underwear, Pants, Non-skid slipper socks, Ted Hose, Shoes Position: Education officer, museum at [employer] - Performed by patient: Thread/unthread right underwear leg, Thread/unthread left underwear leg, Pull underwear up/down Underwear - Performed by helper: Pull underwear up/down Pants- Performed by patient: Thread/unthread right pants leg, Thread/unthread left pants leg, Pull pants up/down Pants- Performed by helper: Pull pants up/down Non-skid slipper socks- Performed by helper: Don/doff left sock Shoes - Performed by helper: Don/doff right shoe TED Hose - Performed by helper: Don/doff right TED hose, Don/doff left TED hose Assist for footwear: Maximal assist Assist for lower body dressing: Supervision or verbal cues  Function - Toileting Toileting activity did not occur: No continent bowel/bladder event Toileting steps completed by patient: Adjust clothing prior to toileting, Performs perineal hygiene, Adjust clothing after toileting Toileting steps completed by helper: Adjust clothing after toileting Toileting Assistive Devices: Grab bar or rail Assist level: Touching or steadying assistance (Pt.75%)  Function - Engineer, petroleum transfer activity did not occur: Safety/medical  concerns Toilet transfer assistive device: Elevated toilet seat/BSC over toilet Assist level to toilet: Touching or steadying assistance (Pt > 75%) Assist level from toilet: Touching or steadying assistance (Pt > 75%) Assist level to bedside commode (at bedside): Maximal assist (Pt 25 - 49%/lift and lower) Assist level from bedside commode (at bedside): Maximal assist (Pt 25 - 49%/lift and lower)  Function - Chair/bed transfer Chair/bed transfer method: Lateral scoot Chair/bed transfer assist level: Touching or steadying assistance (Pt > 75%) Chair/bed transfer assistive device: Armrests  Function - Locomotion: Wheelchair Will patient use wheelchair at discharge?: Yes Type: Manual Max wheelchair distance: 150' Assist Level: Supervision or verbal cues Assist Level: Supervision or verbal cues Wheel 150 feet activity did not occur: Safety/medical concerns Assist Level: Supervision or verbal cues Turns around,maneuvers to table,bed, and toilet,negotiates 3% grade,maneuvers on rugs and over doorsills: No Function - Locomotion: Ambulation Assistive device: Walker-rolling Max distance: 5' Assist level: Moderate assist (Pt 50 - 74%) Assist level: Touching or steadying assistance (Pt > 75%) Walk 50 feet with 2 turns activity did not occur: Safety/medical concerns Walk 150 feet activity did not occur: Safety/medical concerns Walk 10 feet on uneven surfaces activity did not occur: Safety/medical concerns(per report)  Function - Comprehension Comprehension: Auditory Comprehension assistive device: Hearing aids Comprehension assist level: Follows basic conversation/direction with no assist, Set up assist with hearing device  Function - Expression Expression: Verbal Expression assist level: Expresses basic needs/ideas: With extra time/assistive device  Function - Social Interaction Social Interaction assist level: Interacts  appropriately 90% of the time - Needs monitoring or encouragement for participation or interaction.  Function - Problem Solving Problem solving assist level: Solves basic 90% of the time/requires cueing < 10% of the time  Function - Memory Memory assist level: Recognizes or recalls 90% of the time/requires cueing < 10% of the time Patient normally able to recall (first 3 days only): Current season, Staff names and faces, That he or she is in a hospital   Medical Problem List and Plan: 1.  Decreased functional mobility with gait disorder and impairment of ADLs secondary to left supracondylar fracture of the femur s/p ORIF 12/20/2016. Touchdown weightbearing   Cont CIR, patient okay with planned discharge date 2.  DVT Prophylaxis/Anticoagulation: Subcutaneous Lovenox.    Vascular study neg for DVT 3. Pain Management: Oxycodone as needed   Controlled on 11/17 4. Mood: Celexa 40 mg daily, Xanax 0.5 mg 3 times a day as needed 5. Neuropsych: This patient is capable of making decisions on her own behalf. 6. Skin/Wound Care: Routine skin checks 7. Fluids/Electrolytes/Nutrition: Routine I&O's 8. Acute blood loss anemia.    Hb 8.9 on 11/12   Labs ordered for Monday   Cont to monitor 9. CKD stage IV.    Cr. 2.17 on 11/16   Labs ordered for Monday   Encourage fluids   Cont to monitor 10. Hypothyroidism. Continue Synthroid 11. Rheumatoid arthritis. Continue chronic prednisone 5 mg daily. Patient receiving Enbrel 50 mg weekly 12. History of gout. Patient receiving allopurinol daily 13. Constipation. Laxative assistance 14. HTN    On 4 meds at home, norvasc cozaar and tenormin are listed   Restarted norvasc 53m 11/11, increased to 136mon 11/13   Cozaar 25 started on 11/15, increased to 75 on 11/17 Vitals:   12/30/16 1636 12/31/16 0320  BP: (!) 146/67 (!) 171/64  Pulse: 88 94  Resp: 18 16  Temp: 97.7 F (36.5 C) 97.8 F (36.6 C)  SpO2: 96% 98%  LOS (Days) 8 A FACE TO FACE  EVALUATION WAS PERFORMED  Jarret Torre Lorie Phenix 12/31/2016, 7:11 AM

## 2017-01-01 ENCOUNTER — Inpatient Hospital Stay (HOSPITAL_COMMUNITY): Payer: Medicare Other

## 2017-01-01 NOTE — Progress Notes (Signed)
Subjective/Complaints: Pt seen laying in bed this AM.  She slept well overnight.  She states her BP is running higher than at home. Informed her of her meds and that they were being resumed as necessary.   Review of systems:  Denies chest pain, shortness of breath, nausea vomiting, diarrhea.  Objective: Vital Signs: Blood pressure (!) 155/76, pulse 97, temperature 98.4 F (36.9 C), temperature source Oral, resp. rate 18, height _0  (1.702 m), weight 104 kg (229 lb 4.5 oz), SpO2 97 %. No results found. Results for orders placed or performed during the hospital encounter of 12/23/16 (from the past 72 hour(s))  Creatinine, serum     Status: Abnormal   Collection Time: 12/30/16  4:58 AM  Result Value Ref Range   Creatinine, Ser 2.17 (H) 0.44 - 1.00 mg/dL   GFR calc non Af Amer 20 (L) >60 mL/min   GFR calc Af Amer 23 (L) >60 mL/min    Comment: (NOTE) The eGFR has been calculated using the CKD EPI equation. This calculation has not been validated in all clinical situations. eGFR's persistently <60 mL/min signify possible Chronic Kidney Disease.     General: NAD. Vital signs reviewed.  Heart: RRR. No JVD. Lungs: Clear to auscultation, breathing unlabored Abdomen: Positive bowel sounds, nondistended Musculoskeletal: LLE edema and mild tenderness Neurologic:  Motor: 5/5 in bilateral deltoid, bicep, tricep, grip,  5/5 right hip flexor, knee extensors, ankle dorsiflexor and plantar flexor 4+/5 left hip flexors, knee extensors, 5/5 ADF/PF (unchanged) Skin: Intact. Warm and dry.   Assessment/Plan: 1. Functional deficits secondary to left periprosthetic distal femur fracture which require 3+ hours per day of interdisciplinary therapy in a comprehensive inpatient rehab setting. Physiatrist is providing close team supervision and 24 hour management of active medical problems listed below. Physiatrist and rehab team continue to assess barriers to discharge/monitor patient progress toward  functional and medical goals. FIM: Function - Bathing Position: Shower Body parts bathed by patient: Right arm, Left arm, Chest, Abdomen, Front perineal area, Buttocks, Right upper leg, Left upper leg, Right lower leg, Left lower leg Body parts bathed by helper: Back Assist Level: Touching or steadying assistance(Pt > 75%)  Function- Upper Body Dressing/Undressing What is the patient wearing?: Pull over shirt/dress Bra - Perfomed by patient: Thread/unthread left bra strap, Thread/unthread right bra strap Bra - Perfomed by helper: Hook/unhook bra (pull down sports bra) Pull over shirt/dress - Perfomed by patient: Thread/unthread right sleeve, Thread/unthread left sleeve, Put head through opening, Pull shirt over trunk Assist Level: Set up Function - Lower Body Dressing/Undressing What is the patient wearing?: Underwear, Pants, Non-skid slipper socks, Ted Hose, Shoes Position: Education officer, museum at [employer] - Performed by patient: Thread/unthread right underwear leg, Thread/unthread left underwear leg, Pull underwear up/down Underwear - Performed by helper: Pull underwear up/down Pants- Performed by patient: Thread/unthread right pants leg, Thread/unthread left pants leg, Pull pants up/down Pants- Performed by helper: Pull pants up/down Non-skid slipper socks- Performed by helper: Don/doff left sock Shoes - Performed by helper: Don/doff right shoe TED Hose - Performed by helper: Don/doff right TED hose, Don/doff left TED hose Assist for footwear: Maximal assist Assist for lower body dressing: Supervision or verbal cues  Function - Toileting Toileting activity did not occur: No continent bowel/bladder event Toileting steps completed by patient: Adjust clothing prior to toileting, Adjust clothing after toileting Toileting steps completed by helper: Adjust clothing prior to toileting, Performs perineal hygiene, Adjust clothing after toileting Toileting Assistive Devices: Grab bar or  rail Assist level:  More than reasonable time  Function - Air cabin crew transfer activity did not occur: Refused Toilet transfer assistive device: Elevated toilet seat/BSC over toilet Assist level to toilet: Touching or steadying assistance (Pt > 75%) Assist level from toilet: Touching or steadying assistance (Pt > 75%) Assist level to bedside commode (at bedside): Maximal assist (Pt 25 - 49%/lift and lower) Assist level from bedside commode (at bedside): Maximal assist (Pt 25 - 49%/lift and lower)  Function - Chair/bed transfer Chair/bed transfer method: Lateral scoot Chair/bed transfer assist level: Touching or steadying assistance (Pt > 75%) Chair/bed transfer assistive device: Armrests  Function - Locomotion: Wheelchair Will patient use wheelchair at discharge?: Yes Type: Manual Max wheelchair distance: 125 Assist Level: Supervision or verbal cues Assist Level: Supervision or verbal cues Wheel 150 feet activity did not occur: Safety/medical concerns Assist Level: Supervision or verbal cues Turns around,maneuvers to table,bed, and toilet,negotiates 3% grade,maneuvers on rugs and over doorsills: No Function - Locomotion: Ambulation Assistive device: Walker-rolling Max distance: 5' Assist level: Moderate assist (Pt 50 - 74%) Assist level: Touching or steadying assistance (Pt > 75%) Walk 50 feet with 2 turns activity did not occur: Safety/medical concerns Walk 150 feet activity did not occur: Safety/medical concerns Walk 10 feet on uneven surfaces activity did not occur: Safety/medical concerns(per report)  Function - Comprehension Comprehension: Auditory Comprehension assistive device: Hearing aids Comprehension assist level: Follows basic conversation/direction with no assist, Set up assist with hearing device  Function - Expression Expression: Verbal Expression assist level: Expresses basic needs/ideas: With no assist  Function - Social Interaction Social  Interaction assist level: Interacts appropriately with others with medication or extra time (anti-anxiety, antidepressant).  Function - Problem Solving Problem solving assist level: Solves basic 90% of the time/requires cueing < 10% of the time  Function - Memory Memory assist level: Recognizes or recalls 90% of the time/requires cueing < 10% of the time Patient normally able to recall (first 3 days only): Current season, Staff names and faces, That he or she is in a hospital   Medical Problem List and Plan: 1.  Decreased functional mobility with gait disorder and impairment of ADLs secondary to left supracondylar fracture of the femur s/p ORIF 12/20/2016. Touchdown weightbearing   Cont CIR, patient okay with planned discharge date 2.  DVT Prophylaxis/Anticoagulation: Subcutaneous Lovenox.    Vascular study neg for DVT 3. Pain Management: Oxycodone as needed   Controlled on 11/17 4. Mood: Celexa 40 mg daily, Xanax 0.5 mg 3 times a day as needed 5. Neuropsych: This patient is capable of making decisions on her own behalf. 6. Skin/Wound Care: Routine skin checks 7. Fluids/Electrolytes/Nutrition: Routine I&O's 8. Acute blood loss anemia.    Hb 8.9 on 11/12   Labs ordered for tomorrow   Cont to monitor 9. CKD stage IV.    Cr. 2.17 on 11/16   Labs ordered for tomorrow   Encourage fluids   Cont to monitor 10. Hypothyroidism. Continue Synthroid 11. Rheumatoid arthritis. Continue chronic prednisone 5 mg daily. Patient receiving Enbrel 50 mg weekly 12. History of gout. Patient receiving allopurinol daily 13. Constipation. Laxative assistance 14. HTN    On 4 meds at home, norvasc cozaar and tenormin are listed   Restarted norvasc 63m 11/11, increased to 120mon 11/13   Cozaar 25 started on 11/15, increased to 75 on 11/17 Vitals:   12/31/16 1433 01/01/17 0500  BP: (!) 165/63 (!) 155/76  Pulse: 100 97  Resp: 20 18  Temp: 97.9 F (36.6 C)  98.4 F (36.9 C)  SpO2: 100% 97%    Improving, will not make any adjustments today  LOS (Days) 9 A FACE TO FACE EVALUATION WAS PERFORMED  Ankit Lorie Phenix 01/01/2017, 7:14 AM

## 2017-01-02 ENCOUNTER — Inpatient Hospital Stay (HOSPITAL_COMMUNITY): Payer: Medicare Other

## 2017-01-02 ENCOUNTER — Inpatient Hospital Stay (HOSPITAL_COMMUNITY): Payer: Medicare Other | Admitting: Occupational Therapy

## 2017-01-02 DIAGNOSIS — T148XXA Other injury of unspecified body region, initial encounter: Secondary | ICD-10-CM

## 2017-01-02 LAB — CBC WITH DIFFERENTIAL/PLATELET
Basophils Absolute: 0 10*3/uL (ref 0.0–0.1)
Basophils Relative: 0 %
EOS PCT: 5 %
Eosinophils Absolute: 0.6 10*3/uL (ref 0.0–0.7)
HCT: 30.7 % — ABNORMAL LOW (ref 36.0–46.0)
Hemoglobin: 9.5 g/dL — ABNORMAL LOW (ref 12.0–15.0)
LYMPHS ABS: 4.1 10*3/uL — AB (ref 0.7–4.0)
LYMPHS PCT: 33 %
MCH: 32 pg (ref 26.0–34.0)
MCHC: 30.9 g/dL (ref 30.0–36.0)
MCV: 103.4 fL — AB (ref 78.0–100.0)
MONO ABS: 1 10*3/uL (ref 0.1–1.0)
MONOS PCT: 8 %
Neutro Abs: 6.8 10*3/uL (ref 1.7–7.7)
Neutrophils Relative %: 54 %
PLATELETS: 359 10*3/uL (ref 150–400)
RBC: 2.97 MIL/uL — AB (ref 3.87–5.11)
RDW: 14.8 % (ref 11.5–15.5)
WBC: 12.6 10*3/uL — ABNORMAL HIGH (ref 4.0–10.5)

## 2017-01-02 LAB — BASIC METABOLIC PANEL
Anion gap: 9 (ref 5–15)
BUN: 37 mg/dL — AB (ref 6–20)
CHLORIDE: 106 mmol/L (ref 101–111)
CO2: 24 mmol/L (ref 22–32)
Calcium: 10.4 mg/dL — ABNORMAL HIGH (ref 8.9–10.3)
Creatinine, Ser: 2.1 mg/dL — ABNORMAL HIGH (ref 0.44–1.00)
GFR calc Af Amer: 24 mL/min — ABNORMAL LOW (ref >60)
GFR calc non Af Amer: 21 mL/min — ABNORMAL LOW (ref >60)
GLUCOSE: 96 mg/dL (ref 65–99)
POTASSIUM: 4 mmol/L (ref 3.5–5.1)
Sodium: 139 mmol/L (ref 135–145)

## 2017-01-02 MED ORDER — LOSARTAN POTASSIUM 50 MG PO TABS
100.0000 mg | ORAL_TABLET | Freq: Every day | ORAL | Status: DC
  Administered 2017-01-02 – 2017-01-06 (×5): 100 mg via ORAL
  Filled 2017-01-02 (×5): qty 2

## 2017-01-02 NOTE — Progress Notes (Signed)
Occupational Therapy Session Note  Patient Details  Name: Stacy Morris MRN: [id number] Date of Birth: Jan 28, 1934  Today's Date: 01/02/2017 OT Individual Time: 2767-0110 OT Individual Time Calculation (min): 58 min    Short Term Goals: Week 2:  OT Short Term Goal 1 (Week 2): Continue working toward supervision to min assist level goals for discharge.   Skilled Therapeutic Interventions/Progress Updates:    Upon entering the room, pt seated in wheelchair with no c/o pain. Pt requesting to go to bathroom for BM. Pt transferred room wheelchair > elevated toilet with RW and mod lifting assistance. Pt able to maintain TDWB throughout session. Pt able to have BM and performed hygiene with assistance for balance. Pt returning to wheelchair in same manner and washing hands with supervision. Pt propelled wheelchair with increased time and supervision to day room. OT provided demonstration to pt with use of RW and transfer from wheelchair <> TTB. Pt returning demonstration with min A for functional transfers. Pt able to place B LE's into tub without assistance. OT recommended purchase of safety treads in order to decreased fall risk at home. Pt returning to wheelchair and OT provided education to pt regarding management of wheelchair leg rest. Pt able to don and doff B leg rest at end of session. Pt propelled self back to room with call bell and all needed items within reach upon exiting the room.   Therapy Documentation Precautions:  Precautions Precautions: Fall Restrictions Weight Bearing Restrictions: Yes LLE Weight Bearing: Touchdown weight bearing    ADL: ADL ADL Comments: Please see functional navigator for ADL status  See Function Navigator for Current Functional Status.   Therapy/Group: Individual Therapy  Gypsy Decant 01/02/2017, 4:20 PM

## 2017-01-02 NOTE — Plan of Care (Signed)
Pt denies pain at present.  Skin remains intact Continent of B/B

## 2017-01-02 NOTE — Progress Notes (Signed)
Occupational Therapy Session Note  Patient Details  Name: Stacy Morris MRN: [id number] Date of Birth: 1933-05-17  Today's Date: 01/02/2017 OT Individual Time: 1449-1530 OT Individual Time Calculation (min): 41 min    Short Term Goals: Week 2:  OT Short Term Goal 1 (Week 2): Continue working toward supervision to min assist level goals for discharge.   Skilled Therapeutic Interventions/Progress Updates:    Pt completed wheelchair mobility down to the day room with supervision.  She was able to then work on using AE for LB dressing, secondary to not being able to reach down to her left foot or flex her hip high enough to reach it.  She was able to use the reacher to remove her gripper sock and then used the sockaide to donn it, both with mod demonstrational cueing.  Next had her work on donning the left sock and shoe, which she was able to complete without use of AE.  Finished session by having pt work on taking off and putting on her leg rests.  Min assist needed to put on leg rests but she is able to remove them with supervision and min instructional cueing.  Had pt push herself back to the room in wheelchair with supervision.  Pt left with nursing in room at end of session.    Therapy Documentation Precautions:  Precautions Precautions: Fall Restrictions Weight Bearing Restrictions: Yes LLE Weight Bearing: Touchdown weight bearing  Pain:  No report of pain stated  ADL: See Function Navigator for Current Functional Status.   Therapy/Group: Individual Therapy  Sayeed Weatherall OTR/L 01/02/2017, 5:32 PM

## 2017-01-02 NOTE — Progress Notes (Signed)
Physical Therapy Session Note  Patient Details  Name: Stacy Morris MRN: [id number] Date of Birth: 1933-08-28  Today's Date: 01/02/2017 PT Individual Time: 0802-0900, 1300-1330 PT Individual Time Calculation (min): 58 min, 30 min   Short Term Goals: Week 1:  PT Short Term Goal 1 (Week 1): Pt will perform stand pivot transfers with min assist and LRAD PT Short Term Goal 2 (Week 1): Pt will ambulate x 50 ft with LRAD and maintaining TTWB on L LE with min assist PT Short Term Goal 3 (Week 1): Pt will perform bed mobility with supervision  Skilled Therapeutic Interventions/Progress Updates:    Session 1: Pt seated in w/c upon PT arrival, agreeable to therapy tx and denies pain. Pt propelled w/c from room<>gym x 150 ft each direction using B UEs with supervision. Pt performed sit<>stand with supervision using RW, verbal cues for precautions. In standing pt performed hip abd/flexion/extension x 10 in each direction using L LE for strengthening. Pt transferred from w/c<>mat squat pivot with supervision, verbal cues for technique. Pt transferred sitting<>supine with supervision, in supine pt performed LE strengthening exercises: 2 x 10 hip abduction, 2 x 10 SLR, 2 x 10 heel slides, 2 x 10 SAQ. Pt sitting edge of mat performed seated marches, hip abduction with TB, LAQ and L knee flexion stretch. Pt worked on [employer] transfer from w/c<>rehab apartment bed with min assist x 2 stand pivots with RW and x 1 squat pivot. Pt able to maintain precautions 90% of the time with stand pivot and 100% of the time with the squat pivot. Pt left seated in w/c at end of session with needs in reach.   Session 2: Pt seated in w/c upon PT arrival, agreeable to therapy tx and denies pain. Pt propelled w/c from room<>rehab apartment x 60 ft each way with supervision. Pt worked on [employer] surface transfers squat pivot from w/c<>bed in rehab apartment with min assist. Pt performed x2 transfers with L LE on the ground  but unable to maintain precautions and x 1 transfer with therapist holding L LE in order to maintain precautions, min assist for all transfers this session. Pt left seated in w/c at end of session with needs in reach.   Therapy Documentation Precautions:  Precautions Precautions: Fall Restrictions Weight Bearing Restrictions: Yes LLE Weight Bearing: Touchdown weight bearing  Vital Signs: Therapy Vitals Temp: 97.9 F (36.6 C) Temp Source: Oral Pulse Rate: 92 Resp: 17 BP: (!) 154/62 Patient Position (if appropriate): Lying Oxygen Therapy SpO2: 100 % O2 Device: Not Delivered Pain: Pain Assessment Pain Assessment: No/denies pain Pain Score: 0-No pain   See Function Navigator for Current Functional Status.   Therapy/Group: Individual Therapy  Netta Corrigan, PT, DPT 01/02/2017, 8:24 AM

## 2017-01-02 NOTE — Plan of Care (Signed)
  Progressing RH BOWEL ELIMINATION RH STG MANAGE BOWEL WITH ASSISTANCE Description STG Manage Bowel with Mod I Assistance.   01/02/2017 1250 - Progressing by Brita Romp, RN RH STG MANAGE BOWEL W/MEDICATION W/ASSISTANCE Description STG Manage Bowel with Medication with Mod I Assistance.  01/02/2017 1250 - Progressing by Brita Romp, RN RH SKIN INTEGRITY RH STG MAINTAIN SKIN INTEGRITY WITH ASSISTANCE Description STG Maintain Skin Integrity With min Assistance.  01/02/2017 1250 - Progressing by Brita Romp, RN RH SAFETY RH STG ADHERE TO SAFETY PRECAUTIONS W/ASSISTANCE/DEVICE Description STG Adhere to Safety Precautions With min  Assistance/Device.  01/02/2017 1250 - Progressing by Brita Romp, RN RH STG DECREASED RISK OF FALL WITH ASSISTANCE Description STG Decreased Risk of Fall With min Assistance.  01/02/2017 1250 - Progressing by Brita Romp, RN RH BLADDER ELIMINATION RH STG MANAGE BLADDER WITH ASSISTANCE Description STG Manage Bladder With min Assistance  01/02/2017 1250 - Progressing by Brita Romp, RN

## 2017-01-02 NOTE — Progress Notes (Signed)
Educated patient on s/s of infection and encouraged the use of the incentive spirometer as well as adequate fluid intake.  Patient denies any burning or foul odor during urination.  Patient also happy with progress here and states he pain is well managed with PRN tylenol.  Brita Romp, RN

## 2017-01-02 NOTE — Progress Notes (Signed)
Subjective/Complaints: Pt seen laying in bed this AM.  She slept well overnight.  She has questions regarding her BP and BP meds.   Review of systems:  Denies chest pain, shortness of breath, nausea vomiting, diarrhea.  Objective: Vital Signs: Blood pressure (!) 154/62, pulse 92, temperature 97.9 F (36.6 C), temperature source Oral, resp. rate 17, height 5\' 7"  (1.702 m), weight 104 kg (229 lb 4.5 oz), SpO2 100 %. Dg Femur Min 2 Views Left  Result Date: 01/01/2017 CLINICAL DATA:  Postop left femur. EXAM: LEFT FEMUR 2 VIEWS COMPARISON:  Plain film of the left femur dated 12/18/2016. FINDINGS: Plate and screw fixation hardware is now in place, proximal diaphysis to distal condyles. Hardware appears intact and appropriately positioned, traversing the comminuted fracture site in the distal femur. Osseous alignment is significantly improved, now anatomic. Left knee arthroplasty hardware appears intact and appropriately positioned. IMPRESSION: Status post plate and screw fixation hardware placement. Hardware appears appropriately positioned. Osseous alignment is significantly improved. No evidence of surgical complicating feature. Electronically Signed   By: Franki Cabot M.D.   On: 01/01/2017 14:20   No results found for this or any previous visit (from the past 72 hour(s)).  General: NAD. Vital signs reviewed.  Heart: RRR. No JVD. Lungs: Clear to auscultation, breathing unlabored Abdomen: Positive bowel sounds, nondistended Musculoskeletal: LLE edema and mild tenderness Neurologic:  Motor: 5/5 in bilateral deltoid, bicep, tricep, grip,  5/5 right hip flexor, knee extensors, ankle dorsiflexor and plantar flexor 4+/5 left hip flexors, knee extensors, 5/5 ADF/PF (stable) Skin: Intact. Warm and dry.   Assessment/Plan: 1. Functional deficits secondary to left periprosthetic distal femur fracture which require 3+ hours per day of interdisciplinary therapy in a comprehensive inpatient rehab  setting. Physiatrist is providing close team supervision and 24 hour management of active medical problems listed below. Physiatrist and rehab team continue to assess barriers to discharge/monitor patient progress toward functional and medical goals. FIM: Function - Bathing Position: Shower Body parts bathed by patient: Right arm, Left arm, Chest, Abdomen, Front perineal area, Buttocks, Right upper leg, Left upper leg, Right lower leg, Left lower leg Body parts bathed by helper: Back Assist Level: Touching or steadying assistance(Pt > 75%)  Function- Upper Body Dressing/Undressing What is the patient wearing?: Pull over shirt/dress Bra - Perfomed by patient: Thread/unthread left bra strap, Thread/unthread right bra strap Bra - Perfomed by helper: Hook/unhook bra (pull down sports bra) Pull over shirt/dress - Perfomed by patient: Thread/unthread right sleeve, Thread/unthread left sleeve, Put head through opening, Pull shirt over trunk Assist Level: Set up Function - Lower Body Dressing/Undressing What is the patient wearing?: Underwear, Pants, Non-skid slipper socks, Ted Hose, Shoes Position: Education officer, museum at [employer] - Performed by patient: Thread/unthread right underwear leg, Thread/unthread left underwear leg, Pull underwear up/down Underwear - Performed by helper: Pull underwear up/down Pants- Performed by patient: Thread/unthread right pants leg, Thread/unthread left pants leg, Pull pants up/down Pants- Performed by helper: Pull pants up/down Non-skid slipper socks- Performed by helper: Don/doff left sock Shoes - Performed by helper: Don/doff right shoe TED Hose - Performed by helper: Don/doff right TED hose, Don/doff left TED hose Assist for footwear: Maximal assist Assist for lower body dressing: Supervision or verbal cues  Function - Toileting Toileting activity did not occur: No continent bowel/bladder event Toileting steps completed by patient: Adjust clothing prior  to toileting, Performs perineal hygiene, Adjust clothing after toileting Toileting steps completed by helper: Adjust clothing after toileting Toileting Assistive Devices: Grab bar or rail  Assist level: Touching or steadying assistance (Pt.75%)  Function - Air cabin crew transfer activity did not occur: Refused Toilet transfer assistive device: Elevated toilet seat/BSC over toilet, Grab bar Assist level to toilet: Moderate assist (Pt 50 - 74%/lift or lower) Assist level from toilet: Moderate assist (Pt 50 - 74%/lift or lower) Assist level to bedside commode (at bedside): Maximal assist (Pt 25 - 49%/lift and lower) Assist level from bedside commode (at bedside): Maximal assist (Pt 25 - 49%/lift and lower)  Function - Chair/bed transfer Chair/bed transfer method: Lateral scoot Chair/bed transfer assist level: Touching or steadying assistance (Pt > 75%) Chair/bed transfer assistive device: Armrests  Function - Locomotion: Wheelchair Will patient use wheelchair at discharge?: Yes Type: Manual Max wheelchair distance: 125 Assist Level: Supervision or verbal cues Assist Level: Supervision or verbal cues Wheel 150 feet activity did not occur: Safety/medical concerns Assist Level: Supervision or verbal cues Turns around,maneuvers to table,bed, and toilet,negotiates 3% grade,maneuvers on rugs and over doorsills: No Function - Locomotion: Ambulation Assistive device: Walker-rolling Max distance: 5' Assist level: Moderate assist (Pt 50 - 74%) Assist level: Touching or steadying assistance (Pt > 75%) Walk 50 feet with 2 turns activity did not occur: Safety/medical concerns Walk 150 feet activity did not occur: Safety/medical concerns Walk 10 feet on uneven surfaces activity did not occur: Safety/medical concerns(per report)  Function - Comprehension Comprehension: Auditory Comprehension assistive device: Hearing aids Comprehension assist level: Follows basic conversation/direction  with no assist  Function - Expression Expression: Verbal Expression assist level: Expresses basic needs/ideas: With no assist  Function - Social Interaction Social Interaction assist level: Interacts appropriately with others with medication or extra time (anti-anxiety, antidepressant).  Function - Problem Solving Problem solving assist level: Solves basic 90% of the time/requires cueing < 10% of the time  Function - Memory Memory assist level: Recognizes or recalls 90% of the time/requires cueing < 10% of the time Patient normally able to recall (first 3 days only): Current season, Staff names and faces, That he or she is in a hospital   Medical Problem List and Plan: 1.  Decreased functional mobility with gait disorder and impairment of ADLs secondary to left supracondylar fracture of the femur s/p ORIF 12/20/2016. Touchdown weightbearing   Cont CIR   Xray ordered by Ortho, reviewed, showing healing, await Ortho updated recs. 2.  DVT Prophylaxis/Anticoagulation: Subcutaneous Lovenox.    Vascular study neg for DVT 3. Pain Management: Oxycodone as needed   Controlled on 11/18 4. Mood: Celexa 40 mg daily, Xanax 0.5 mg 3 times a day as needed 5. Neuropsych: This patient is capable of making decisions on her own behalf. 6. Skin/Wound Care: Routine skin checks 7. Fluids/Electrolytes/Nutrition: Routine I&O's 8. Acute blood loss anemia.    Hb 8.9 on 11/12   Labs pending   Cont to monitor 9. CKD stage IV.    Cr. 2.17 on 11/16   Labs pending   Encourage fluids   Cont to monitor 10. Hypothyroidism. Continue Synthroid 11. Rheumatoid arthritis. Continue chronic prednisone 5 mg daily. Patient receiving Enbrel 50 mg weekly 12. History of gout. Patient receiving allopurinol daily 13. Constipation. Laxative assistance 14. HTN    On 4 meds at home, norvasc cozaar and tenormin are listed   Restarted norvasc 5mg  11/11, increased to 10mg  on 11/13   Cozaar 25 started on 11/15, increased to  75 on 11/17, increased to 100 on 11/19 Vitals:   01/02/17 0455 01/02/17 0518  BP: (!) 176/70 (!) 154/62  Pulse: 98 92  Resp: 17   Temp: 97.9 F (36.6 C)   SpO2: 100%    LOS (Days) 10 A FACE TO FACE EVALUATION WAS PERFORMED  Wren Gallaga Lorie Phenix 01/02/2017, 8:17 AM

## 2017-01-03 ENCOUNTER — Inpatient Hospital Stay (HOSPITAL_COMMUNITY): Payer: Medicare Other

## 2017-01-03 ENCOUNTER — Inpatient Hospital Stay (HOSPITAL_COMMUNITY): Payer: Medicare Other | Admitting: Physical Therapy

## 2017-01-03 ENCOUNTER — Inpatient Hospital Stay (HOSPITAL_COMMUNITY): Payer: Medicare Other | Admitting: Occupational Therapy

## 2017-01-03 DIAGNOSIS — D72829 Elevated white blood cell count, unspecified: Secondary | ICD-10-CM

## 2017-01-03 MED ORDER — ATENOLOL 25 MG PO TABS
25.0000 mg | ORAL_TABLET | Freq: Every day | ORAL | Status: DC
  Administered 2017-01-03 – 2017-01-07 (×5): 25 mg via ORAL
  Filled 2017-01-03 (×5): qty 1

## 2017-01-03 NOTE — Progress Notes (Signed)
BP 117/45 with HR 71.  Pt started on new BP medications this morning. Pt asymptomatic.  Silvestre Mesi, PA notified with no new orders at this time.

## 2017-01-03 NOTE — Progress Notes (Signed)
Orthopaedic Trauma Progress Note  S: Leg feels good, no complaints regarding pain. Anxious to start putting weight on the leg.  O:  Vitals:   01/02/17 1550 01/03/17 0507  BP: (!) 177/60 (!) 173/69  Pulse: 94 96  Resp: 19 16  Temp: 98.1 F (36.7 C) 98 F (36.7 C)  SpO2: 98% 94%   LLE: Incisions clean and dry. ROM up to 90 degrees. Neurovascularly intact  A/P: 83 year old female s/p ORIF of left periprosthetic distal femur fracture, now 2 weeks out  Will allow advancement to WBAT LLE If painful, limit the amount of weight on leg Continue rehab Return to see me in 4 weeks in office for another set of x-rays.  Shona Needles, MD [facility] [phone] (phone)

## 2017-01-03 NOTE — Progress Notes (Signed)
Physical Therapy Weekly Progress Note  Patient Details  Name: Stacy Morris MRN: [id number] Date of Birth: May 07, 1933  Beginning of progress report period: December 24, 2016 End of progress report period: January 03, 2017  Today's Date: 01/03/2017 PT Individual Time: 2035-5974, 1638-4536 PT Individual Time Calculation (min): 72 min, 60 min  Patient has met 3 of 3 short term goals. Pt was having difficulty with ambulation and stand pivot transfers secondary to inability to maintain TTWB precautions, however she is now WBAT on the L LE and is progressing with all functional transfers and ambulation.   Patient continues to demonstrate the following deficits muscle weakness and muscle joint tightness, decreased cardiorespiratoy endurance and decreased standing balance and decreased balance strategies and therefore will continue to benefit from skilled PT intervention to increase functional independence with mobility.  Patient progressing toward long term goals..  Continue plan of care.  PT Short Term Goals Week 1:  PT Short Term Goal 1 (Week 1): Pt will perform stand pivot transfers with min assist and LRAD PT Short Term Goal 1 - Progress (Week 1): Met PT Short Term Goal 2 (Week 1): Pt will ambulate x 50 ft with LRAD and maintaining TTWB on L LE with min assist PT Short Term Goal 2 - Progress (Week 1): Not met PT Short Term Goal 3 (Week 1): Pt will perform bed mobility with supervision PT Short Term Goal 3 - Progress (Week 1): Met Week 2:  PT Short Term Goal 1 (Week 2): STG=LTG due to ELOS  Skilled Therapeutic Interventions/Progress Updates:  Training and development officer;Ambulation/gait training;Cognitive remediation/compensation;Community reintegration;Discharge planning;Disease management/prevention;DME/adaptive equipment instruction;Functional electrical stimulation;Functional mobility training;Neuromuscular re-education;Pain management;Patient/family education;Psychosocial support;Skin  care/wound management;Stair training;Therapeutic Activities;Visual/perceptual remediation/compensation;Therapeutic Exercise;UE/LE Strength taining/ROM;Wheelchair propulsion/positioning;UE/LE Coordination activities   Session 1: Pt seated in w/c upon PT arrival, agreeable to therapy tx and denies pain. Pt now WBAT on L LE as of 01/03/17. Pt requesting to take a shower this AM, therapist agreeable. Pt ambulated around the room using RW in order to gather clothing with min assist. Pt ambulated into bathroom using RW and min assist. Pt performed sit<>stands x 2 in order to doff dirty clothing and then ambulated into walk in shower with RW and min assist. Pt seated on shower bench during shower, standing intermittently with min assist and use of grab bar to wash backside. Pt ambulated from shower>w/c with RW and min assist. Seated in w/c to perform upper body dressing with supervision and lower body dressing with min assist, performed sit<>stand min assist to pull pants over hips. Pt worked on standing balance and standing tolerance while standing at the sink 2 x 5 minutes in order to dry and curl hair. Pt ambulated 2 x 40 ft with RW and supervision, verbal cues for technique. Pt performed 2 x 10 mini squats for LE strengthening and knee ROM. Pt left seated in w/c at end of session with needs in reach.   Session 2: Pt seated in w/c upon PT arrival, agreeable to therapy tx and denies pain. Pt propelled w/c x 100 ft from room<>gym with supervision. Pt ambulated 2 x 75 ft using RW and supervision, verbal cues for step through pattern and upright posture. Pt worked on dynamic standing balance without UE support in order to throw horse shoes. Pt used nustep x 6 minutes on workload 4 using B LEs working on cardiovascular endurance, strengthening and knee ROM. Pt worked on dynamic standing balance without UE support in order to play wii tennis, occasional min assist  to prevent LOB. Pt transferred w/c>bed and sitting>supine  with supervision, left supine in bed with needs in reach.   Therapy Documentation Precautions:  Precautions Precautions: Fall Restrictions Weight Bearing Restrictions: Yes LLE Weight Bearing: Touchdown weight bearing   See Function Navigator for Current Functional Status.  Therapy/Group: Individual Therapy  Netta Corrigan, PT, DPT 01/03/2017, 8:01 AM

## 2017-01-03 NOTE — Progress Notes (Signed)
Subjective/Complaints: Pt working with therapies this AM.  She slept well overnight.  She is happy about now being WBAT.    Review of systems:  Denies chest pain, shortness of breath, nausea vomiting, diarrhea.  Objective: Vital Signs: Blood pressure (!) 173/69, pulse 96, temperature 98 F (36.7 C), temperature source Oral, resp. rate 16, height _0  (1.702 m), weight 104 kg (229 lb 4.5 oz), SpO2 94 %. Dg Femur Min 2 Views Left  Result Date: 01/01/2017 CLINICAL DATA:  Postop left femur. EXAM: LEFT FEMUR 2 VIEWS COMPARISON:  Plain film of the left femur dated 12/18/2016. FINDINGS: Plate and screw fixation hardware is now in place, proximal diaphysis to distal condyles. Hardware appears intact and appropriately positioned, traversing the comminuted fracture site in the distal femur. Osseous alignment is significantly improved, now anatomic. Left knee arthroplasty hardware appears intact and appropriately positioned. IMPRESSION: Status post plate and screw fixation hardware placement. Hardware appears appropriately positioned. Osseous alignment is significantly improved. No evidence of surgical complicating feature. Electronically Signed   By: Franki Cabot M.D.   On: 01/01/2017 14:20   Results for orders placed or performed during the hospital encounter of 12/23/16 (from the past 72 hour(s))  Basic metabolic panel     Status: Abnormal   Collection Time: 01/02/17  7:29 AM  Result Value Ref Range   Sodium 139 135 - 145 mmol/L   Potassium 4.0 3.5 - 5.1 mmol/L   Chloride 106 101 - 111 mmol/L   CO2 24 22 - 32 mmol/L   Glucose, Bld 96 65 - 99 mg/dL   BUN 37 (H) 6 - 20 mg/dL   Creatinine, Ser 2.10 (H) 0.44 - 1.00 mg/dL   Calcium 10.4 (H) 8.9 - 10.3 mg/dL   GFR calc non Af Amer 21 (L) >60 mL/min   GFR calc Af Amer 24 (L) >60 mL/min    Comment: (NOTE) The eGFR has been calculated using the CKD EPI equation. This calculation has not been validated in all clinical situations. eGFR's persistently  <60 mL/min signify possible Chronic Kidney Disease.    Anion gap 9 5 - 15  CBC with Differential/Platelet     Status: Abnormal   Collection Time: 01/02/17  7:29 AM  Result Value Ref Range   WBC 12.6 (H) 4.0 - 10.5 K/uL   RBC 2.97 (L) 3.87 - 5.11 MIL/uL   Hemoglobin 9.5 (L) 12.0 - 15.0 g/dL   HCT 30.7 (L) 36.0 - 46.0 %   MCV 103.4 (H) 78.0 - 100.0 fL   MCH 32.0 26.0 - 34.0 pg   MCHC 30.9 30.0 - 36.0 g/dL   RDW 14.8 11.5 - 15.5 %   Platelets 359 150 - 400 K/uL   Neutrophils Relative % 54 %   Neutro Abs 6.8 1.7 - 7.7 K/uL   Lymphocytes Relative 33 %   Lymphs Abs 4.1 (H) 0.7 - 4.0 K/uL   Monocytes Relative 8 %   Monocytes Absolute 1.0 0.1 - 1.0 K/uL   Eosinophils Relative 5 %   Eosinophils Absolute 0.6 0.0 - 0.7 K/uL   Basophils Relative 0 %   Basophils Absolute 0.0 0.0 - 0.1 K/uL    General: NAD. Vital signs reviewed.  Heart: RRR. No JVD. Lungs: Clear to auscultation, breathing unlabored Abdomen: Positive bowel sounds, nondistended Musculoskeletal: LLE edema and mild tenderness Neurologic:  Motor: 5/5 in bilateral deltoid, bicep, tricep, grip,  5/5 right hip flexor, knee extensors, ankle dorsiflexor and plantar flexor 4+/5 left hip flexors, knee extensors, 5/5 ADF/PF (  unchanged) Skin: Intact. Warm and dry.   Assessment/Plan: 1. Functional deficits secondary to left periprosthetic distal femur fracture which require 3+ hours per day of interdisciplinary therapy in a comprehensive inpatient rehab setting. Physiatrist is providing close team supervision and 24 hour management of active medical problems listed below. Physiatrist and rehab team continue to assess barriers to discharge/monitor patient progress toward functional and medical goals. FIM: Function - Bathing Position: Shower Body parts bathed by patient: Right arm, Left arm, Chest, Abdomen, Front perineal area, Buttocks, Right upper leg, Left upper leg, Right lower leg, Left lower leg Body parts bathed by helper:  Back Assist Level: Touching or steadying assistance(Pt > 75%)  Function- Upper Body Dressing/Undressing What is the patient wearing?: Pull over shirt/dress Bra - Perfomed by patient: Thread/unthread left bra strap, Thread/unthread right bra strap Bra - Perfomed by helper: Hook/unhook bra (pull down sports bra) Pull over shirt/dress - Perfomed by patient: Thread/unthread right sleeve, Thread/unthread left sleeve, Put head through opening, Pull shirt over trunk Assist Level: Set up Function - Lower Body Dressing/Undressing What is the patient wearing?: Underwear, Pants, Non-skid slipper socks, Maryln Manuel, Shoes Position: Education officer, museum at [employer] - Performed by patient: Thread/unthread right underwear leg, Thread/unthread left underwear leg, Pull underwear up/down Underwear - Performed by helper: Pull underwear up/down Pants- Performed by patient: Thread/unthread right pants leg, Thread/unthread left pants leg, Pull pants up/down Pants- Performed by helper: Pull pants up/down Non-skid slipper socks- Performed by helper: Don/doff left sock Shoes - Performed by helper: Don/doff right shoe TED Hose - Performed by helper: Don/doff right TED hose, Don/doff left TED hose Assist for footwear: Maximal assist Assist for lower body dressing: Supervision or verbal cues  Function - Toileting Toileting activity did not occur: No continent bowel/bladder event Toileting steps completed by patient: Adjust clothing prior to toileting, Performs perineal hygiene, Adjust clothing after toileting Toileting steps completed by helper: Adjust clothing after toileting Toileting Assistive Devices: Grab bar or rail Assist level: Touching or steadying assistance (Pt.75%)  Function - Air cabin crew transfer activity did not occur: Refused Toilet transfer assistive device: Elevated toilet seat/BSC over toilet, Grab bar Assist level to toilet: Moderate assist (Pt 50 - 74%/lift or lower) Assist level  from toilet: Moderate assist (Pt 50 - 74%/lift or lower) Assist level to bedside commode (at bedside): Maximal assist (Pt 25 - 49%/lift and lower) Assist level from bedside commode (at bedside): Maximal assist (Pt 25 - 49%/lift and lower)  Function - Chair/bed transfer Chair/bed transfer method: Squat pivot, Stand pivot Chair/bed transfer assist level: Touching or steadying assistance (Pt > 75%) Chair/bed transfer assistive device: Armrests  Function - Locomotion: Wheelchair Will patient use wheelchair at discharge?: Yes Type: Manual Max wheelchair distance: 150 Assist Level: Supervision or verbal cues Assist Level: Supervision or verbal cues Wheel 150 feet activity did not occur: Safety/medical concerns Assist Level: Supervision or verbal cues Turns around,maneuvers to table,bed, and toilet,negotiates 3% grade,maneuvers on rugs and over doorsills: No Function - Locomotion: Ambulation Assistive device: Walker-rolling Max distance: 5' Assist level: Moderate assist (Pt 50 - 74%) Assist level: Touching or steadying assistance (Pt > 75%) Walk 50 feet with 2 turns activity did not occur: Safety/medical concerns Walk 150 feet activity did not occur: Safety/medical concerns Walk 10 feet on uneven surfaces activity did not occur: Safety/medical concerns(per report)  Function - Comprehension Comprehension: Auditory Comprehension assistive device: Hearing aids Comprehension assist level: Follows complex conversation/direction with extra time/assistive device  Function - Expression Expression: Verbal Expression assist level: Expresses  complex ideas: With extra time/assistive device  Function - Social Interaction Social Interaction assist level: Interacts appropriately with others with medication or extra time (anti-anxiety, antidepressant).  Function - Problem Solving Problem solving assist level: Solves basic problems with no assist  Function - Memory Memory assist level: Recognizes  or recalls 90% of the time/requires cueing < 10% of the time Patient normally able to recall (first 3 days only): Current season, Staff names and faces, That he or she is in a hospital   Medical Problem List and Plan: 1.  Decreased functional mobility with gait disorder and impairment of ADLs secondary to left supracondylar fracture of the femur s/p ORIF 12/20/2016. Touchdown weightbearing   Cont CIR   Xray ordered by Ortho, reviewed, showing healing, now WBAT 2.  DVT Prophylaxis/Anticoagulation: Subcutaneous Lovenox.    Vascular study neg for DVT 3. Pain Management: Oxycodone as needed   Controlled on 11/20 4. Mood: Celexa 40 mg daily, Xanax 0.5 mg 3 times a day as needed 5. Neuropsych: This patient is capable of making decisions on her own behalf. 6. Skin/Wound Care: Routine skin checks 7. Fluids/Electrolytes/Nutrition: Routine I&O's 8. Acute blood loss anemia.    Hb 9.5 on 11/19   Cont to monitor 9. CKD stage IV.    Cr. 2.10 on 11/19   Encourage fluids   Cont to monitor 10. Hypothyroidism. Continue Synthroid 11. Rheumatoid arthritis. Continue chronic prednisone 5 mg daily. Patient receiving Enbrel 50 mg weekly 12. History of gout. Patient receiving allopurinol daily 13. Constipation. Laxative assistance 14. HTN    On 4 meds at home, norvasc cozaar and tenormin are listed   Norvasc increased to 27m on 11/13   Cozaar increased to 100 on 11/19  Remains uncontrolled, Tenormin 25 started on 11/20  Renal ultrasound ordered Vitals:   01/02/17 1550 01/03/17 0507  BP: (!) 177/60 (!) 173/69  Pulse: 94 96  Resp: 19 16  Temp: 98.1 F (36.7 C) 98 F (36.7 C)  SpO2: 98% 94%  15. Leukocytosis  WBCs 12.6 on 11/19 (?component of concentration)  Afebrile   UA ordered  LOS (Days) 11 A FACE TO FACE EVALUATION WAS PERFORMED  Meriam Chojnowski ALorie Phenix11/20/2018, 9:22 AM

## 2017-01-03 NOTE — Progress Notes (Signed)
Occupational Therapy Session Note  Patient Details  Name: Stacy Morris MRN: [id number] Date of Birth: October 11, 1933  Today's Date: 01/03/2017 OT Individual Time: 1105-1200 OT Individual Time Calculation (min): 55 min    Short Term Goals: Week 2:  OT Short Term Goal 1 (Week 2): Continue working toward supervision to min assist level goals for discharge.   Skilled Therapeutic Interventions/Progress Updates:    Pt's daughter present for session.  Provided education with pt's daughter on selfcare and tub/shower transfers.  Pt completed sit to stand and tub shower transfers with min assist sit to stand.  Now pt is weightbearing as tolerated on the LLE.  Daughter assisted with tub shower transfers using the tub bench.  Also had pt complete donning sock and shoe on the left foot with min assist using the sockaide and the shoe funnel.  Pt returned to room at end of session with pt staying in wheelchair with call button and phone in reach.   Therapy Documentation Precautions:  Precautions Precautions: Fall Restrictions Weight Bearing Restrictions: Yes LLE Weight Bearing: Weight bearing as tolerated  Pain: Pain Assessment Pain Assessment: No/denies pain ADL: See Function Navigator for Current Functional Status.   Therapy/Group: Individual Therapy  Zayaan Kozak OTR/L 01/03/2017, 12:54 PM

## 2017-01-03 NOTE — Progress Notes (Signed)
Social Work Patient ID: Stacy Morris, female   DOB: 1933-12-05, 83 y.o.   MRN: [id number]  Daughter coming in for therapy today and was informed of WB increase this may make a big difference in pt's abilities. See how therapies go today.

## 2017-01-04 ENCOUNTER — Inpatient Hospital Stay (HOSPITAL_COMMUNITY): Payer: Medicare Other

## 2017-01-04 ENCOUNTER — Inpatient Hospital Stay (HOSPITAL_COMMUNITY): Payer: Medicare Other | Admitting: Occupational Therapy

## 2017-01-04 DIAGNOSIS — R0989 Other specified symptoms and signs involving the circulatory and respiratory systems: Secondary | ICD-10-CM

## 2017-01-04 DIAGNOSIS — I10 Essential (primary) hypertension: Secondary | ICD-10-CM

## 2017-01-04 DIAGNOSIS — I150 Renovascular hypertension: Secondary | ICD-10-CM

## 2017-01-04 LAB — URINALYSIS, COMPLETE (UACMP) WITH MICROSCOPIC
BACTERIA UA: NONE SEEN
Bilirubin Urine: NEGATIVE
GLUCOSE, UA: NEGATIVE mg/dL
Hgb urine dipstick: NEGATIVE
KETONES UR: NEGATIVE mg/dL
NITRITE: NEGATIVE
PROTEIN: NEGATIVE mg/dL
Specific Gravity, Urine: 1.015 (ref 1.005–1.030)
pH: 5 (ref 5.0–8.0)

## 2017-01-04 NOTE — Progress Notes (Signed)
Social Work Patient ID: Stacy Morris, female   DOB: 07/31/1933, 83 y.o.   MRN: [id number]  Met with pt to discuss team conference goals supervision level and still recommending 24 hr care for a short time. She and daughter are working on this. She is pleased with her progress and levels.

## 2017-01-04 NOTE — Progress Notes (Addendum)
Subjective/Complaints: Patient seen lying in bed this morning. She states she slept well overnight. She states she feels she is doing much better after being allowed to weight-bear.  Review of systems:  Denies chest pain, shortness of breath, nausea vomiting, diarrhea.  Objective: Vital Signs: Blood pressure (!) 161/65, pulse 69, temperature 98 F (36.7 C), temperature source Oral, resp. rate 16, height _0  (1.702 m), weight 104 kg (229 lb 4.5 oz), SpO2 96 %. US Renal  Result Date: 01/03/2017 CLINICAL DATA:  Hypertension. EXAM: RENAL / URINARY TRACT ULTRASOUND COMPLETE COMPARISON:  None. FINDINGS: Right Kidney: Length: 8.7 cm. Diffusely increased renal parenchymal echotexture and diffuse parenchymal atrophy consistent with chronic medical renal disease. Small cyst on the midpole measuring 1 cm diameter. No hydronephrosis. Left Kidney: Length: 9.9 cm. Diffusely increased renal parenchymal echotexture and diffuse parenchymal atrophy consistent with chronic medical renal disease. Small cyst on the upper pole measuring 1.7 cm maximal diameter. No hydronephrosis. Bladder: Bladder is decompressed and therefore not well evaluated. IMPRESSION: 1. Renal parenchymal atrophy and increased echotexture bilaterally consistent with chronic medical renal disease. 2. Small bilateral renal cysts. 3. No hydronephrosis. 4. Bladder is decompressed and not well evaluated. Electronically Signed   By: Lucienne Capers M.D.   On: 01/03/2017 21:27   Results for orders placed or performed during the hospital encounter of 12/23/16 (from the past 72 hour(s))  Basic metabolic panel     Status: Abnormal   Collection Time: 01/02/17  7:29 AM  Result Value Ref Range   Sodium 139 135 - 145 mmol/L   Potassium 4.0 3.5 - 5.1 mmol/L   Chloride 106 101 - 111 mmol/L   CO2 24 22 - 32 mmol/L   Glucose, Bld 96 65 - 99 mg/dL   BUN 37 (H) 6 - 20 mg/dL   Creatinine, Ser 2.10 (H) 0.44 - 1.00 mg/dL   Calcium 10.4 (H) 8.9 - 10.3 mg/dL    GFR calc non Af Amer 21 (L) >60 mL/min   GFR calc Af Amer 24 (L) >60 mL/min    Comment: (NOTE) The eGFR has been calculated using the CKD EPI equation. This calculation has not been validated in all clinical situations. eGFR's persistently <60 mL/min signify possible Chronic Kidney Disease.    Anion gap 9 5 - 15  CBC with Differential/Platelet     Status: Abnormal   Collection Time: 01/02/17  7:29 AM  Result Value Ref Range   WBC 12.6 (H) 4.0 - 10.5 K/uL   RBC 2.97 (L) 3.87 - 5.11 MIL/uL   Hemoglobin 9.5 (L) 12.0 - 15.0 g/dL   HCT 30.7 (L) 36.0 - 46.0 %   MCV 103.4 (H) 78.0 - 100.0 fL   MCH 32.0 26.0 - 34.0 pg   MCHC 30.9 30.0 - 36.0 g/dL   RDW 14.8 11.5 - 15.5 %   Platelets 359 150 - 400 K/uL   Neutrophils Relative % 54 %   Neutro Abs 6.8 1.7 - 7.7 K/uL   Lymphocytes Relative 33 %   Lymphs Abs 4.1 (H) 0.7 - 4.0 K/uL   Monocytes Relative 8 %   Monocytes Absolute 1.0 0.1 - 1.0 K/uL   Eosinophils Relative 5 %   Eosinophils Absolute 0.6 0.0 - 0.7 K/uL   Basophils Relative 0 %   Basophils Absolute 0.0 0.0 - 0.1 K/uL    General: NAD. Vital signs reviewed.  Heart: RRR. No JVD. Lungs: Clear to auscultation, breathing unlabored Abdomen: Positive bowel sounds, nondistended Musculoskeletal: LLE edema and mild tenderness  Neurologic:  Motor: 5/5 in bilateral deltoid, bicep, tricep, grip,  5/5 right hip flexor, knee extensors, ankle dorsiflexor and plantar flexor 4+/5 left hip flexors, knee extensors, 5/5 ADF/PF (improving) Skin: Intact. Warm and dry.   Assessment/Plan: 1. Functional deficits secondary to left periprosthetic distal femur fracture which require 3+ hours per day of interdisciplinary therapy in a comprehensive inpatient rehab setting. Physiatrist is providing close team supervision and 24 hour management of active medical problems listed below. Physiatrist and rehab team continue to assess barriers to discharge/monitor patient progress toward functional and medical  goals. FIM: Function - Bathing Position: Shower Body parts bathed by patient: Right arm, Left arm, Chest, Abdomen, Front perineal area, Buttocks, Right upper leg, Left upper leg, Right lower leg, Left lower leg Body parts bathed by helper: Back Assist Level: Touching or steadying assistance(Pt > 75%)  Function- Upper Body Dressing/Undressing What is the patient wearing?: Pull over shirt/dress Bra - Perfomed by patient: Thread/unthread left bra strap, Thread/unthread right bra strap Bra - Perfomed by helper: Hook/unhook bra (pull down sports bra) Pull over shirt/dress - Perfomed by patient: Thread/unthread right sleeve, Thread/unthread left sleeve, Put head through opening, Pull shirt over trunk Assist Level: Set up Function - Lower Body Dressing/Undressing What is the patient wearing?: Underwear, Pants, Non-skid slipper socks, Maryln Manuel, Shoes Position: Education officer, museum at [employer] - Performed by patient: Thread/unthread right underwear leg, Thread/unthread left underwear leg, Pull underwear up/down Underwear - Performed by helper: Pull underwear up/down Pants- Performed by patient: Thread/unthread right pants leg, Thread/unthread left pants leg, Pull pants up/down Pants- Performed by helper: Pull pants up/down Non-skid slipper socks- Performed by helper: Don/doff left sock Shoes - Performed by helper: Don/doff right shoe TED Hose - Performed by helper: Don/doff right TED hose, Don/doff left TED hose Assist for footwear: Maximal assist Assist for lower body dressing: Supervision or verbal cues  Function - Toileting Toileting activity did not occur: No continent bowel/bladder event Toileting steps completed by patient: Adjust clothing prior to toileting, Performs perineal hygiene, Adjust clothing after toileting Toileting steps completed by helper: Adjust clothing after toileting Toileting Assistive Devices: Grab bar or rail Assist level: Touching or steadying assistance  (Pt.75%)  Function - Air cabin crew transfer activity did not occur: Refused Toilet transfer assistive device: Elevated toilet seat/BSC over toilet, Grab bar Assist level to toilet: Moderate assist (Pt 50 - 74%/lift or lower) Assist level from toilet: Moderate assist (Pt 50 - 74%/lift or lower) Assist level to bedside commode (at bedside): Maximal assist (Pt 25 - 49%/lift and lower) Assist level from bedside commode (at bedside): Maximal assist (Pt 25 - 49%/lift and lower)  Function - Chair/bed transfer Chair/bed transfer method: Stand pivot, Ambulatory Chair/bed transfer assist level: Touching or steadying assistance (Pt > 75%) Chair/bed transfer assistive device: Armrests, Walker  Function - Locomotion: Wheelchair Will patient use wheelchair at discharge?: Yes Type: Manual Max wheelchair distance: 150 Assist Level: Supervision or verbal cues Assist Level: Supervision or verbal cues Wheel 150 feet activity did not occur: Safety/medical concerns Assist Level: Supervision or verbal cues Turns around,maneuvers to table,bed, and toilet,negotiates 3% grade,maneuvers on rugs and over doorsills: No Function - Locomotion: Ambulation Assistive device: Walker-rolling Max distance: 75 ft Assist level: Touching or steadying assistance (Pt > 75%) Assist level: Touching or steadying assistance (Pt > 75%) Walk 50 feet with 2 turns activity did not occur: Safety/medical concerns Assist level: Touching or steadying assistance (Pt > 75%) Walk 150 feet activity did not occur: Safety/medical concerns Walk 10 feet  on uneven surfaces activity did not occur: Safety/medical concerns(per report)  Function - Comprehension Comprehension: Auditory Comprehension assistive device: Hearing aids Comprehension assist level: Follows complex conversation/direction with extra time/assistive device  Function - Expression Expression: Verbal Expression assist level: Expresses complex ideas: With extra  time/assistive device  Function - Social Interaction Social Interaction assist level: Interacts appropriately with others with medication or extra time (anti-anxiety, antidepressant).  Function - Problem Solving Problem solving assist level: Solves basic problems with no assist  Function - Memory Memory assist level: Recognizes or recalls 90% of the time/requires cueing < 10% of the time Patient normally able to recall (first 3 days only): Current season, Staff names and faces, That he or she is in a hospital   Medical Problem List and Plan: 1.  Decreased functional mobility with gait disorder and impairment of ADLs secondary to left supracondylar fracture of the femur s/p ORIF 12/20/2016.    Cont CIR, team conference today. We will discuss potential changes in discharge date.   Xray ordered by Ortho, reviewed, showing healing, now WBAT 2.  DVT Prophylaxis/Anticoagulation: Subcutaneous Lovenox.    Vascular study neg for DVT 3. Pain Management: Oxycodone as needed   Controlled on 11/21 4. Mood: Celexa 40 mg daily, Xanax 0.5 mg 3 times a day as needed 5. Neuropsych: This patient is capable of making decisions on her own behalf. 6. Skin/Wound Care: Routine skin checks 7. Fluids/Electrolytes/Nutrition: Routine I&O's 8. Acute blood loss anemia.    Hb 9.5 on 11/19   Cont to monitor 9. CKD stage IV.    Cr. 2.10 on 11/19   Encourage fluids   Cont to monitor 10. Hypothyroidism. Continue Synthroid 11. Rheumatoid arthritis. Continue chronic prednisone 5 mg daily. Patient receiving Enbrel 50 mg weekly 12. History of gout. Patient receiving allopurinol daily 13. Constipation. Laxative assistance 14. HTN    On 4 meds at home, norvasc cozaar and tenormin are listed   Norvasc increased to 77m on 11/13   Cozaar increased to 100 on 11/19  Remains uncontrolled, Tenormin 25 started on 11/20  Renal ultrasound showing chronic medical renal disease  Labile, but overall improving. Vitals:    01/03/17 1439 01/04/17 0408  BP: (!) 117/45 (!) 161/65  Pulse: 70 69  Resp: 18 16  Temp: 98.4 F (36.9 C) 98 F (36.7 C)  SpO2: 100% 96%  15. Leukocytosis  WBCs 12.6 on 11/19 (?component of concentration)  Afebrile   UA ordered, pending   Labs ordered for tomorrow  LOS (Days) 12 A FACE TO FACE EVALUATION WAS PERFORMED  Conrad Zajkowski ALorie Phenix11/21/2018, 7:18 AM

## 2017-01-04 NOTE — Progress Notes (Signed)
Physical Therapy Session Note  Patient Details  Name: Stacy Morris MRN: [id number] Date of Birth: October 20, 1933  Today's Date: 01/04/2017 PT Individual Time: 1300-1409, 6067-7034 PT Individual Time Calculation (min): 69 min, 28 min  Short Term Goals: Week 2:  PT Short Term Goal 1 (Week 2): STG=LTG due to ELOS  Skilled Therapeutic Interventions/Progress Updates:    Session 1: Pt seated in w/c upon PT arrival, agreeable to therapy tx and denies pain. Pt propelled w/c to the gym using B UEs with supervision. Pt ambulated 2 x 60 ft using RW and supervision, seated rest break needed between secondary to fatigue and limited endurance. Pt worked on standing therex at parallel bars, performed 2 x 10 mini squats and 1 x 10 hip abduction.Pt performed seated therex: 2 x 10 LAQ with 3# weight, x 20 marches and 2 x 10 hip abduction with TB. Worked on standing balance and tolerance in order to perform card matching activity. Worked on standing balance while standing on foam 2 x 30 sec. Pt reporting increased L knee soreness with pain 5/10, therapist suggesting chair level activities for the duration of the session to minimize pain. Pt used UE ergometer x 6 minutes alternating forward/backward every minute working on cardiovascular endurance. Pt performed UE strengthening exercises while seated in chair, bicep curls, shoulder flexion and shoulder abduction. Pt left seated in w/c at end of session with ice applied to L knee for pain relief.   Session 2: Pt seated in w/c upon PT arrival, agreeable to therapy tx and denies pain. Pt ambulated x 100 ft from room>gym with supervision using RW. Pt worked on standing dynamic balance to complete ball toss and to complete puzzle. Pt performed seated therex: LAQ, knee flexion stretch, standing hip abduction and marches, all 2 x 10 each. Pt ambulated x 55 ft using RW and supervision, part way back to room, limited by fatigue. Pt left seated in w/c at end of session with needs  in reach.   Therapy Documentation Precautions:  Precautions Precautions: Fall Restrictions Weight Bearing Restrictions: Yes LLE Weight Bearing: Weight bearing as tolerated   See Function Navigator for Current Functional Status.   Therapy/Group: Individual Therapy  Netta Corrigan, PT, DPT 01/04/2017, 7:53 AM

## 2017-01-04 NOTE — Progress Notes (Signed)
Occupational Therapy Session Note  Patient Details  Name: Stacy Morris MRN: [id number] Date of Birth: Jan 18, 1934  Today's Date: 01/04/2017 OT Individual Time: 4469-5072 OT Individual Time Calculation (min): 25 min    Short Term Goals: Week 2:  Continue working on established LTGs set at [employer] supervision level.    Skilled Therapeutic Interventions/Progress Updates:    Session 1:  Pt completed sit to stand transitions in the dayroom with min assist as well as static standing balance with min guard, while engaged in UE functional use with the Wii.  She was able to maintain standing for intervals of 10 mins with min guard before needing to sit and rest.  Returned to room at end of session with pt left sitting up in the wheelchair with call button and phone in reach and safety belt in place.    Session 2:  [phone])  Took pt to the ADL kitchen where therapist provided education on use of the RW for simple meal prep.  Pt was able to use her walker to ambulate around the kitchen, using countertop and flat surfaces to transfer items around in order to make as sandwich.  Min instructional cueing for technique with min guard assist for safety.  Had pt complete transfer with the RW to the lower couch as well.  Min assist for sit to stand from the couch and ambulate back to her room.  Finished session with ice applied to left knee secondary to pt reporting increased pain at the knee.  Call button and phone in reach.      Therapy Documentation Precautions:  Precautions Precautions: Fall Restrictions Weight Bearing Restrictions: No LLE Weight Bearing: Weight bearing as tolerated   Pain: Pain Assessment Pain Score: 0-No pain ADL: See Function Navigator for Current Functional Status.   Therapy/Group: Individual Therapy  Shadee Rathod OTR/L 01/04/2017, 3:45 PM

## 2017-01-04 NOTE — Patient Care Conference (Signed)
Inpatient RehabilitationTeam Conference and Plan of Care Update Date: 01/04/2017   Time: 11:00 AM    Patient Name: Stacy Morris      Medical Record Number: [id number]  Date of Birth: 12/04/1933 Sex: Female         Room/Bed: 4M01C/4M01C-01 Payor Info: Payor: [employer] MEDICARE / Plan: [employer] MEDICARE / Product Type: *No Product type* /    Admitting Diagnosis: Femur FX  Admit Date/Time:  12/23/2016  5:48 PM Admission Comments: No comment available   Primary Diagnosis:  <principal problem not specified> Principal Problem: <principal problem not specified>  Patient Active Problem List   Diagnosis Date Noted  . HTN (hypertension)   . Labile blood pressure   . Leukocytosis   . Uncontrolled hypertension   . Benign essential HTN   . Chronic kidney disease (CKD), stage IV (severe) ([place])   . Post-operative pain   . Closed displaced supracondylar fracture with intracondylar extension of lower end of left femur ([place]) 12/23/2016  . Fall   . Fracture   . Acute blood loss anemia   . History of gout   . Constipation due to pain medication   . Closed comminuted intra-articular fracture of distal end of left femur ([place]) 12/18/2016  . CKD (chronic kidney disease), stage III ([place]) 12/18/2016    Class: Chronic  . Hypothyroidism 12/18/2016    Class: Chronic  . Renal failure (ARF), acute on chronic (HCC) 04/09/2014  . Rheumatoid arthritis ([place]) 04/09/2014  . Essential hypertension 04/09/2014  . Vomiting and diarrhea 04/09/2014  . Acute on chronic renal failure ([place]) 04/09/2014    Expected Discharge Date: Expected Discharge Date: 01/07/17  Team Members Present: Physician leading conference: Dr. Delice Lesch Social Worker Present: Ovidio Kin, LCSW Nurse Present: Junius Creamer, RN PT Present: Michaelene Song, PT OT Present: Clyda Greener, OT SLP Present: Weston Anna, SLP PPS Coordinator present : Daiva Nakayama, RN, CRRN     Current Status/Progress Goal Weekly Team Focus   Medical   Decreased functional mobility with gait disorder and impairment of ADLs secondary to left supracondylar fracture of the femur s/p ORIF 12/20/2016.   Improve uncontrolled HTN, CKD, labs  See above   Bowel/Bladder   continent of bowel and bladder LBM 11-20  remaing continent, maintain regular bowel pattern  Assist with toileting needs   Swallow/Nutrition/ Hydration             ADL's   supervision for UB selfcare with min assist for LB selfcare with AE use, transfers now at min assist level since weightbearing has changed.    supervision to modified independent overall  selfcare retraining, balance retraining, transfer traingin, UE strengthening, pt/family education, DME education   Mobility   supervision bed mobility, supervision sit<>stand, supervision stand pivot transfers, min assist gait up to 75 ft  mod I- supervision goals  gait, endurance, LE strengthening/ROM, d/c planning   Communication             Safety/Cognition/ Behavioral Observations            Pain   no c/o pain, prn tylenol and oxycodone  pain <3  Assess pain q shift and prn   Skin   MASD to abdominal folds and groin incision  no new skin breakdown  Assess skin q shift and prn      *See Care Plan and progress notes for long and short-term goals.     Barriers to Discharge  Current Status/Progress Possible Resolutions Date Resolved   Physician  Medical stability     See above  Therapies, optimize HTN meds, follow labs, UA ordered for leukocytosis      Nursing                  PT                    OT                  SLP                SW                Discharge Planning/Teaching Needs:    Home with daughter arranging 24 hr supervision for a short time. Now pt is WBAT is helping with her mobility     Team Discussion:  Progressing toward her goals of supervision level. Now WBAT is doing better but still needs assist with ADL's and mobility. UA ordered yesterday and re-introducing BP home  meds. CKD encouraging  Fluids. Poor endurance working on in therapies.  Revisions to Treatment Plan:  DC 11/24    Continued Need for Acute Rehabilitation Level of Care: The patient requires daily medical management by a physician with specialized training in physical medicine and rehabilitation for the following conditions: Daily direction of a multidisciplinary physical rehabilitation program to ensure safe treatment while eliciting the highest outcome that is of practical value to the patient.: Yes Daily medical management of patient stability for increased activity during participation in an intensive rehabilitation regime.: Yes Daily analysis of laboratory values and/or radiology reports with any subsequent need for medication adjustment of medical intervention for : Post surgical problems;Blood pressure problems;Other  [name] 01/04/2017, 11:37 AM

## 2017-01-05 DIAGNOSIS — R8290 Unspecified abnormal findings in urine: Secondary | ICD-10-CM

## 2017-01-05 LAB — CBC WITH DIFFERENTIAL/PLATELET
BASOS PCT: 0 %
Band Neutrophils: 0 %
Basophils Absolute: 0 10*3/uL (ref 0.0–0.1)
Blasts: 0 %
EOS PCT: 3 %
Eosinophils Absolute: 0.3 10*3/uL (ref 0.0–0.7)
HCT: 25.2 % — ABNORMAL LOW (ref 36.0–46.0)
Hemoglobin: 8.1 g/dL — ABNORMAL LOW (ref 12.0–15.0)
LYMPHS ABS: 3.1 10*3/uL (ref 0.7–4.0)
LYMPHS PCT: 33 %
MCH: 32.8 pg (ref 26.0–34.0)
MCHC: 32.1 g/dL (ref 30.0–36.0)
MCV: 102 fL — ABNORMAL HIGH (ref 78.0–100.0)
MONO ABS: 1 10*3/uL (ref 0.1–1.0)
MYELOCYTES: 0 %
Metamyelocytes Relative: 0 %
Monocytes Relative: 11 %
NEUTROS ABS: 5 10*3/uL (ref 1.7–7.7)
NEUTROS PCT: 53 %
NRBC: 0 /100{WBCs}
OTHER: 0 %
PLATELETS: 280 10*3/uL (ref 150–400)
Promyelocytes Absolute: 0 %
RBC: 2.47 MIL/uL — ABNORMAL LOW (ref 3.87–5.11)
RDW: 14.7 % (ref 11.5–15.5)
WBC: 9.4 10*3/uL (ref 4.0–10.5)

## 2017-01-05 NOTE — Progress Notes (Signed)
Subjective/Complaints: Pt seen laying in bed this AM.  She slept well overnight.  She is looking forward to rest today.   Review of systems:  Denies chest pain, shortness of breath, nausea vomiting, diarrhea.  Objective: Vital Signs: Blood pressure (!) 141/57, pulse 64, temperature 97.6 F (36.4 C), temperature source Oral, resp. rate 18, height 5\' 7"  (1.702 m), weight 104 kg (229 lb 4.5 oz), SpO2 97 %. US Renal  Result Date: 01/03/2017 CLINICAL DATA:  Hypertension. EXAM: RENAL / URINARY TRACT ULTRASOUND COMPLETE COMPARISON:  None. FINDINGS: Right Kidney: Length: 8.7 cm. Diffusely increased renal parenchymal echotexture and diffuse parenchymal atrophy consistent with chronic medical renal disease. Small cyst on the midpole measuring 1 cm diameter. No hydronephrosis. Left Kidney: Length: 9.9 cm. Diffusely increased renal parenchymal echotexture and diffuse parenchymal atrophy consistent with chronic medical renal disease. Small cyst on the upper pole measuring 1.7 cm maximal diameter. No hydronephrosis. Bladder: Bladder is decompressed and therefore not well evaluated. IMPRESSION: 1. Renal parenchymal atrophy and increased echotexture bilaterally consistent with chronic medical renal disease. 2. Small bilateral renal cysts. 3. No hydronephrosis. 4. Bladder is decompressed and not well evaluated. Electronically Signed   By: Lucienne Capers M.D.   On: 01/03/2017 21:27   Results for orders placed or performed during the hospital encounter of 12/23/16 (from the past 72 hour(s))  Urinalysis, Complete w Microscopic     Status: Abnormal   Collection Time: 01/03/17  9:28 AM  Result Value Ref Range   Color, Urine YELLOW YELLOW   APPearance CLEAR CLEAR   Specific Gravity, Urine 1.015 1.005 - 1.030   pH 5.0 5.0 - 8.0   Glucose, UA NEGATIVE NEGATIVE mg/dL   Hgb urine dipstick NEGATIVE NEGATIVE   Bilirubin Urine NEGATIVE NEGATIVE   Ketones, ur NEGATIVE NEGATIVE mg/dL   Protein, ur NEGATIVE NEGATIVE  mg/dL   Nitrite NEGATIVE NEGATIVE   Leukocytes, UA TRACE (A) NEGATIVE   RBC / HPF 0-5 0 - 5 RBC/hpf   WBC, UA 0-5 0 - 5 WBC/hpf   Bacteria, UA NONE SEEN NONE SEEN   Squamous Epithelial / LPF 0-5 (A) NONE SEEN   Mucus PRESENT    Hyaline Casts, UA PRESENT   CBC with Differential/Platelet     Status: Abnormal   Collection Time: 01/05/17  4:36 AM  Result Value Ref Range   WBC 9.4 4.0 - 10.5 K/uL   RBC 2.47 (L) 3.87 - 5.11 MIL/uL   Hemoglobin 8.1 (L) 12.0 - 15.0 g/dL   HCT 25.2 (L) 36.0 - 46.0 %   MCV 102.0 (H) 78.0 - 100.0 fL   MCH 32.8 26.0 - 34.0 pg   MCHC 32.1 30.0 - 36.0 g/dL   RDW 14.7 11.5 - 15.5 %   Platelets 280 150 - 400 K/uL   Neutrophils Relative % 53 %   Lymphocytes Relative 33 %   Monocytes Relative 11 %   Eosinophils Relative 3 %   Basophils Relative 0 %   Band Neutrophils 0 %   Metamyelocytes Relative 0 %   Myelocytes 0 %   Promyelocytes Absolute 0 %   Blasts 0 %   nRBC 0 0 /100 WBC   Other 0 %   Neutro Abs 5.0 1.7 - 7.7 K/uL   Lymphs Abs 3.1 0.7 - 4.0 K/uL   Monocytes Absolute 1.0 0.1 - 1.0 K/uL   Eosinophils Absolute 0.3 0.0 - 0.7 K/uL   Basophils Absolute 0.0 0.0 - 0.1 K/uL   RBC Morphology POLYCHROMASIA PRESENT  General: NAD. Vital signs reviewed.  Heart: RRR. No JVD. Lungs: Clear to auscultation, breathing unlabored Abdomen: Positive bowel sounds, nondistended Musculoskeletal: LLE edema and mild tenderness Neurologic:  Motor: 5/5 in bilateral deltoid, bicep, tricep, grip,  5/5 right hip flexor, knee extensors, ankle dorsiflexor and plantar flexor 4+/5 left hip flexors, knee extensors, 5/5 ADF/PF (improving) Skin: Intact. Warm and dry.   Assessment/Plan: 1. Functional deficits secondary to left periprosthetic distal femur fracture which require 3+ hours per day of interdisciplinary therapy in a comprehensive inpatient rehab setting. Physiatrist is providing close team supervision and 24 hour management of active medical problems listed  below. Physiatrist and rehab team continue to assess barriers to discharge/monitor patient progress toward functional and medical goals. FIM: Function - Bathing Position: Shower Body parts bathed by patient: Right arm, Left arm, Chest, Abdomen, Front perineal area, Buttocks, Right upper leg, Left upper leg, Right lower leg, Left lower leg Body parts bathed by helper: Back Assist Level: Touching or steadying assistance(Pt > 75%)  Function- Upper Body Dressing/Undressing What is the patient wearing?: Pull over shirt/dress Bra - Perfomed by patient: Thread/unthread left bra strap, Thread/unthread right bra strap Bra - Perfomed by helper: Hook/unhook bra (pull down sports bra) Pull over shirt/dress - Perfomed by patient: Thread/unthread right sleeve, Thread/unthread left sleeve, Put head through opening, Pull shirt over trunk Assist Level: Set up Function - Lower Body Dressing/Undressing What is the patient wearing?: Underwear, Pants, Non-skid slipper socks, Maryln Manuel, Shoes Position: Education officer, museum at [employer] - Performed by patient: Thread/unthread right underwear leg, Thread/unthread left underwear leg, Pull underwear up/down Underwear - Performed by helper: Pull underwear up/down Pants- Performed by patient: Thread/unthread right pants leg, Thread/unthread left pants leg, Pull pants up/down Pants- Performed by helper: Pull pants up/down Non-skid slipper socks- Performed by helper: Don/doff left sock Shoes - Performed by helper: Don/doff right shoe TED Hose - Performed by helper: Don/doff right TED hose, Don/doff left TED hose Assist for footwear: Maximal assist Assist for lower body dressing: Supervision or verbal cues  Function - Toileting Toileting activity did not occur: No continent bowel/bladder event Toileting steps completed by patient: Adjust clothing prior to toileting, Performs perineal hygiene, Adjust clothing after toileting Toileting steps completed by helper:  Adjust clothing after toileting Toileting Assistive Devices: Grab bar or rail Assist level: Supervision or verbal cues  Function - Air cabin crew transfer activity did not occur: Refused Toilet transfer assistive device: Elevated toilet seat/BSC over toilet, Grab bar Assist level to toilet: Moderate assist (Pt 50 - 74%/lift or lower) Assist level from toilet: Moderate assist (Pt 50 - 74%/lift or lower) Assist level to bedside commode (at bedside): Maximal assist (Pt 25 - 49%/lift and lower) Assist level from bedside commode (at bedside): Maximal assist (Pt 25 - 49%/lift and lower)  Function - Chair/bed transfer Chair/bed transfer method: Stand pivot, Ambulatory Chair/bed transfer assist level: Supervision or verbal cues Chair/bed transfer assistive device: Armrests, Walker  Function - Locomotion: Wheelchair Will patient use wheelchair at discharge?: Yes Type: Manual Max wheelchair distance: 150 Assist Level: Supervision or verbal cues Assist Level: Supervision or verbal cues Wheel 150 feet activity did not occur: Safety/medical concerns Assist Level: Supervision or verbal cues Turns around,maneuvers to table,bed, and toilet,negotiates 3% grade,maneuvers on rugs and over doorsills: No Function - Locomotion: Ambulation Assistive device: Walker-rolling Max distance: 50 ft Assist level: Supervision or verbal cues Assist level: Supervision or verbal cues Walk 50 feet with 2 turns activity did not occur: Safety/medical concerns Assist level: Supervision or  verbal cues Walk 150 feet activity did not occur: Safety/medical concerns Walk 10 feet on uneven surfaces activity did not occur: Safety/medical concerns(per report)  Function - Comprehension Comprehension: Auditory Comprehension assistive device: Hearing aids Comprehension assist level: Follows complex conversation/direction with extra time/assistive device  Function - Expression Expression: Verbal Expression assist  level: Expresses complex ideas: With extra time/assistive device  Function - Social Interaction Social Interaction assist level: Interacts appropriately with others with medication or extra time (anti-anxiety, antidepressant).  Function - Problem Solving Problem solving assist level: Solves basic problems with no assist  Function - Memory Memory assist level: Recognizes or recalls 90% of the time/requires cueing < 10% of the time Patient normally able to recall (first 3 days only): Current season, Staff names and faces, That he or she is in a hospital   Medical Problem List and Plan: 1.  Decreased functional mobility with gait disorder and impairment of ADLs secondary to left supracondylar fracture of the femur s/p ORIF 12/20/2016.    Cont CIR, team conference today. We will discuss potential changes in discharge date.   Xray ordered by Ortho, reviewed, showing healing, now WBAT 2.  DVT Prophylaxis/Anticoagulation: Subcutaneous Lovenox.    Vascular study neg for DVT 3. Pain Management: Oxycodone as needed   Controlled on 11/22 4. Mood: Celexa 40 mg daily, Xanax 0.5 mg 3 times a day as needed 5. Neuropsych: This patient is capable of making decisions on her own behalf. 6. Skin/Wound Care: Routine skin checks 7. Fluids/Electrolytes/Nutrition: Routine I&O's 8. Acute blood loss anemia.    Hb 8.1 on 11/22   Cont to monitor 9. CKD stage IV.    Cr. 2.10 on 11/19   Encourage fluids   Cont to monitor 10. Hypothyroidism. Continue Synthroid 11. Rheumatoid arthritis. Continue chronic prednisone 5 mg daily. Patient receiving Enbrel 50 mg weekly 12. History of gout. Patient receiving allopurinol daily 13. Constipation. Laxative assistance 14. HTN    On 4 meds at home, norvasc cozaar and tenormin are listed   Norvasc increased to 10mg  on 11/13   Cozaar increased to 100 on 11/19  Remains uncontrolled, Tenormin 25 started on 11/20  Renal ultrasound showing chronic medical renal  disease  Improving on 11/22 Vitals:   01/04/17 1610 01/05/17 0500  BP: (!) 116/57 (!) 141/57  Pulse: 65 64  Resp: 17 18  Temp: 97.8 F (36.6 C) 97.6 F (36.4 C)  SpO2: 98% 97%  15. Leukocytosis: resolved  WBCs 9.4 on 11/22  Afebrile   UA relatively unremarkable  LOS (Days) 13 A FACE TO FACE EVALUATION WAS PERFORMED  Quindon Denker Lorie Phenix 01/05/2017, 7:31 AM

## 2017-01-06 ENCOUNTER — Inpatient Hospital Stay (HOSPITAL_COMMUNITY): Payer: Medicare Other | Admitting: Physical Therapy

## 2017-01-06 ENCOUNTER — Other Ambulatory Visit: Payer: Self-pay

## 2017-01-06 ENCOUNTER — Inpatient Hospital Stay (HOSPITAL_COMMUNITY): Payer: Medicare Other | Admitting: Occupational Therapy

## 2017-01-06 LAB — CREATININE, SERUM
CREATININE: 2.7 mg/dL — AB (ref 0.44–1.00)
GFR calc non Af Amer: 15 mL/min — ABNORMAL LOW (ref >60)
GFR, EST AFRICAN AMERICAN: 18 mL/min — AB (ref >60)

## 2017-01-06 MED ORDER — CITALOPRAM HYDROBROMIDE 40 MG PO TABS: 40.0000 mg | ORAL_TABLET | Freq: Every day | ORAL | 0 refills | Status: DC

## 2017-01-06 MED ORDER — AMLODIPINE BESYLATE 5 MG PO TABS
5.0000 mg | ORAL_TABLET | Freq: Every day | ORAL | 0 refills | Status: DC
Start: 2017-01-06 — End: 2017-01-06

## 2017-01-06 MED ORDER — ATENOLOL 25 MG PO TABS: 25.0000 mg | ORAL_TABLET | Freq: Every day | ORAL | 0 refills | Status: AC

## 2017-01-06 MED ORDER — AMLODIPINE BESYLATE 5 MG PO TABS: 5.0000 mg | ORAL_TABLET | Freq: Every day | ORAL | 0 refills | Status: DC

## 2017-01-06 MED ORDER — ENOXAPARIN SODIUM 30 MG/0.3ML ~~LOC~~ SOLN
30.0000 mg | SUBCUTANEOUS | Status: DC
  Administered 2017-01-06: 30 mg via SUBCUTANEOUS
  Filled 2017-01-06 (×2): qty 0.3

## 2017-01-06 MED ORDER — LOSARTAN POTASSIUM 100 MG PO TABS: 100.0000 mg | ORAL_TABLET | Freq: Every day | ORAL | 0 refills | Status: DC

## 2017-01-06 NOTE — Progress Notes (Signed)
Social Work  Discharge Note  The overall goal for the admission was met for: DC Sat 01/07/2017  Discharge location: Yes-HOME WITH HIRED ASSIST AND FAMILY ASSISTING  Length of Stay: Yes-15 DAYS  Discharge activity level: Yes-SUPERVISION LEVEL  Home/community participation: Yes  Services provided included: MD, RD, PT, OT, RN, CM, Pharmacy and SW  Financial Services: Private Insurance: [facility]  Follow-up services arranged: Home Health: [facility], DME: [facility], WHEELCHAIR, [facility] and Patient/Family request agency HH: PREF USED BEFORE, DME: NO PREF  Comments (or additional information):DAUGHTER AND PT AWARE TEAM RECOMMENDS 24 HR CARE FOR 1-2 WEEKS. WB CHANGED TO WBAT STILL GETTING USED TO THIS. DAUGHTER WAS HIRING ASSIST FOR PT AT [facility].  Patient/Family verbalized understanding of follow-up arrangements: Yes  Individual responsible for coordination of the follow-up plan: DEBBY-DAUGHTER  Confirmed correct DME delivered: [name] 01/06/2017    [name]

## 2017-01-06 NOTE — Progress Notes (Signed)
Subjective/Complaints: Pt seen laying in bed this AM. She slept well overnight.  She is looking forward to discharge tomorrow and appreciative of her care.   Review of systems:  Denies chest pain, shortness of breath, nausea vomiting, diarrhea.  Objective: Vital Signs: Blood pressure (!) 130/54, pulse 63, temperature 97.9 F (36.6 C), temperature source Oral, resp. rate 18, height 5' 7"  (1.702 m), weight 104 kg (229 lb 4.5 oz), SpO2 97 %. No results found. Results for orders placed or performed during the hospital encounter of 12/23/16 (from the past 72 hour(s))  Urinalysis, Complete w Microscopic     Status: Abnormal   Collection Time: 01/03/17  9:28 AM  Result Value Ref Range   Color, Urine YELLOW YELLOW   APPearance CLEAR CLEAR   Specific Gravity, Urine 1.015 1.005 - 1.030   pH 5.0 5.0 - 8.0   Glucose, UA NEGATIVE NEGATIVE mg/dL   Hgb urine dipstick NEGATIVE NEGATIVE   Bilirubin Urine NEGATIVE NEGATIVE   Ketones, ur NEGATIVE NEGATIVE mg/dL   Protein, ur NEGATIVE NEGATIVE mg/dL   Nitrite NEGATIVE NEGATIVE   Leukocytes, UA TRACE (A) NEGATIVE   RBC / HPF 0-5 0 - 5 RBC/hpf   WBC, UA 0-5 0 - 5 WBC/hpf   Bacteria, UA NONE SEEN NONE SEEN   Squamous Epithelial / LPF 0-5 (A) NONE SEEN   Mucus PRESENT    Hyaline Casts, UA PRESENT   CBC with Differential/Platelet     Status: Abnormal   Collection Time: 01/05/17  4:36 AM  Result Value Ref Range   WBC 9.4 4.0 - 10.5 K/uL   RBC 2.47 (L) 3.87 - 5.11 MIL/uL   Hemoglobin 8.1 (L) 12.0 - 15.0 g/dL   HCT 25.2 (L) 36.0 - 46.0 %   MCV 102.0 (H) 78.0 - 100.0 fL   MCH 32.8 26.0 - 34.0 pg   MCHC 32.1 30.0 - 36.0 g/dL   RDW 14.7 11.5 - 15.5 %   Platelets 280 150 - 400 K/uL   Neutrophils Relative % 53 %   Lymphocytes Relative 33 %   Monocytes Relative 11 %   Eosinophils Relative 3 %   Basophils Relative 0 %   Band Neutrophils 0 %   Metamyelocytes Relative 0 %   Myelocytes 0 %   Promyelocytes Absolute 0 %   Blasts 0 %   nRBC 0 0 /100  WBC   Other 0 %   Neutro Abs 5.0 1.7 - 7.7 K/uL   Lymphs Abs 3.1 0.7 - 4.0 K/uL   Monocytes Absolute 1.0 0.1 - 1.0 K/uL   Eosinophils Absolute 0.3 0.0 - 0.7 K/uL   Basophils Absolute 0.0 0.0 - 0.1 K/uL   RBC Morphology POLYCHROMASIA PRESENT   Creatinine, serum     Status: Abnormal   Collection Time: 01/06/17  5:10 AM  Result Value Ref Range   Creatinine, Ser 2.70 (H) 0.44 - 1.00 mg/dL   GFR calc non Af Amer 15 (L) >60 mL/min   GFR calc Af Amer 18 (L) >60 mL/min    Comment: (NOTE) The eGFR has been calculated using the CKD EPI equation. This calculation has not been validated in all clinical situations. eGFR's persistently <60 mL/min signify possible Chronic Kidney Disease.     General: NAD. Vital signs reviewed.  Heart: RRR. No JVD. Lungs: Clear to auscultation, breathing unlabored Abdomen: Positive bowel sounds, nondistended Musculoskeletal: LLE edema and mild tenderness Neurologic:  Motor: 5/5 in bilateral deltoid, bicep, tricep, grip,  5/5 right hip flexor, knee extensors,  ankle dorsiflexor and plantar flexor 4+/5 left hip flexors, knee extensors, 5/5 ADF/PF (stable) Skin: Intact. Warm and dry.   Assessment/Plan: 1. Functional deficits secondary to left periprosthetic distal femur fracture which require 3+ hours per day of interdisciplinary therapy in a comprehensive inpatient rehab setting. Physiatrist is providing close team supervision and 24 hour management of active medical problems listed below. Physiatrist and rehab team continue to assess barriers to discharge/monitor patient progress toward functional and medical goals. FIM: Function - Bathing Position: Shower Body parts bathed by patient: Right arm, Left arm, Chest, Abdomen, Front perineal area, Buttocks, Right upper leg, Left upper leg, Right lower leg, Left lower leg Body parts bathed by helper: Back Assist Level: Touching or steadying assistance(Pt > 75%)  Function- Upper Body Dressing/Undressing What is  the patient wearing?: Pull over shirt/dress Bra - Perfomed by patient: Thread/unthread left bra strap, Thread/unthread right bra strap Bra - Perfomed by helper: Hook/unhook bra (pull down sports bra) Pull over shirt/dress - Perfomed by patient: Thread/unthread right sleeve, Thread/unthread left sleeve, Put head through opening, Pull shirt over trunk Assist Level: Set up Function - Lower Body Dressing/Undressing What is the patient wearing?: Underwear, Pants, Non-skid slipper socks, Maryln Manuel, Shoes Position: Education officer, museum at [employer] - Performed by patient: Thread/unthread right underwear leg, Thread/unthread left underwear leg, Pull underwear up/down Underwear - Performed by helper: Pull underwear up/down Pants- Performed by patient: Thread/unthread right pants leg, Thread/unthread left pants leg, Pull pants up/down Pants- Performed by helper: Pull pants up/down Non-skid slipper socks- Performed by helper: Don/doff left sock Shoes - Performed by helper: Don/doff right shoe TED Hose - Performed by helper: Don/doff right TED hose, Don/doff left TED hose Assist for footwear: Maximal assist Assist for lower body dressing: Supervision or verbal cues  Function - Toileting Toileting activity did not occur: No continent bowel/bladder event Toileting steps completed by patient: Adjust clothing prior to toileting, Performs perineal hygiene, Adjust clothing after toileting Toileting steps completed by helper: Adjust clothing after toileting Toileting Assistive Devices: Grab bar or rail Assist level: Supervision or verbal cues  Function - Air cabin crew transfer activity did not occur: Refused Toilet transfer assistive device: Elevated toilet seat/BSC over toilet, Grab bar Assist level to toilet: Moderate assist (Pt 50 - 74%/lift or lower) Assist level from toilet: Moderate assist (Pt 50 - 74%/lift or lower) Assist level to bedside commode (at bedside): Maximal assist (Pt 25 -  49%/lift and lower) Assist level from bedside commode (at bedside): Maximal assist (Pt 25 - 49%/lift and lower)  Function - Chair/bed transfer Chair/bed transfer method: Stand pivot, Ambulatory Chair/bed transfer assist level: Supervision or verbal cues Chair/bed transfer assistive device: Armrests, Walker  Function - Locomotion: Wheelchair Will patient use wheelchair at discharge?: Yes Type: Manual Max wheelchair distance: 150 Assist Level: Supervision or verbal cues Assist Level: Supervision or verbal cues Wheel 150 feet activity did not occur: Safety/medical concerns Assist Level: Supervision or verbal cues Turns around,maneuvers to table,bed, and toilet,negotiates 3% grade,maneuvers on rugs and over doorsills: No Function - Locomotion: Ambulation Assistive device: Walker-rolling Max distance: 50 ft Assist level: Supervision or verbal cues Assist level: Supervision or verbal cues Walk 50 feet with 2 turns activity did not occur: Safety/medical concerns Assist level: Supervision or verbal cues Walk 150 feet activity did not occur: Safety/medical concerns Walk 10 feet on uneven surfaces activity did not occur: Safety/medical concerns(per report)  Function - Comprehension Comprehension: Auditory Comprehension assistive device: Hearing aids Comprehension assist level: Follows complex conversation/direction with extra time/assistive  device  Function - Expression Expression: Verbal Expression assist level: Expresses complex ideas: With extra time/assistive device  Function - Social Interaction Social Interaction assist level: Interacts appropriately with others with medication or extra time (anti-anxiety, antidepressant).  Function - Problem Solving Problem solving assist level: Solves basic problems with no assist  Function - Memory Memory assist level: Recognizes or recalls 90% of the time/requires cueing < 10% of the time Patient normally able to recall (first 3 days  only): Current season, Staff names and faces, That he or she is in a hospital   Medical Problem List and Plan: 1.  Decreased functional mobility with gait disorder and impairment of ADLs secondary to left supracondylar fracture of the femur s/p ORIF 12/20/2016.    Cont CIR, d/c tomorrow   Will see patient for transitional care management in 1-2 weeks   Xray ordered by Ortho, reviewed, showing healing, now WBAT 2.  DVT Prophylaxis/Anticoagulation: Subcutaneous Lovenox.    Vascular study neg for DVT 3. Pain Management: Oxycodone as needed   Controlled on 11/23 4. Mood: Celexa 40 mg daily, Xanax 0.5 mg 3 times a day as needed 5. Neuropsych: This patient is capable of making decisions on her own behalf. 6. Skin/Wound Care: Routine skin checks 7. Fluids/Electrolytes/Nutrition: Routine I&O's 8. Acute blood loss anemia.    Hb 8.1 on 11/22   Cont to monitor 9. CKD stage IV.    Cr. 2.70 on 11/23   Encourage fluids    Will need ambulatory follow up   Cont to monitor 10. Hypothyroidism. Continue Synthroid 11. Rheumatoid arthritis. Continue chronic prednisone 5 mg daily. Patient receiving Enbrel 50 mg weekly 12. History of gout. Patient receiving allopurinol daily 13. Constipation. Laxative assistance 14. HTN    On 4 meds at home, norvasc cozaar and tenormin are listed   Norvasc increased to 71m on 11/13   Cozaar increased to 100 on 11/19  Remains uncontrolled, Tenormin 25 started on 11/20  Renal ultrasound showing chronic medical renal disease  Continues to improve on 11/23 Vitals:   01/05/17 1601 01/06/17 0522  BP: (!) 114/50 (!) 130/54  Pulse: 63 63  Resp: 18 18  Temp: 98.4 F (36.9 C) 97.9 F (36.6 C)  SpO2: 97% 97%  15. Leukocytosis: resolved  WBCs 9.4 on 11/22  Afebrile   UA relatively unremarkable  LOS (Days) 14 A FACE TO FACE EVALUATION WAS PERFORMED  Ankit ALorie Phenix11/23/2018, 7:10 AM

## 2017-01-06 NOTE — Discharge Instructions (Signed)
Inpatient Rehab Discharge Instructions  Stacy Morris Discharge date and time:  01/06/17  Activities/Precautions/ Functional Status: Activity: As tolerated. No strenuous activity.  Diet: regular diet Wound Care: keep wound clean and dry . Contact MD if you develop any problems with your incision/wound--redness, swelling, increase in pain, drainage or if you develop fever or chills.    Functional status:  ___ No restrictions     ___ Walk up steps independently _X__ 24/7 supervision/assistance   ___ Walk up steps with assistance ___ Intermittent supervision/assistance  ___ Bathe/dress independently ___ Walk with walker     __X_ Bathe/dress with assistance ___ Walk Independently    ___ Shower independently ___ Walk with assistance    ___ Shower with assistance _X__ No alcohol     ___ Return to work/school ________   Special Instructions:    COMMUNITY REFERRALS UPON DISCHARGE:    Home Health:   PT  & OT  [id number]:[phone]   Date of last service:01/07/2017  Medical Equipment/Items Ordered:WHEELCHAIR, [place]   [phone]  Other:DAUGHTER HIRING PRIVATE DUTY CARE FOR AT [place]    My questions have been answered and I understand these instructions. I will adhere to these goals and the provided educational materials after my discharge from the hospital.  Patient/Caregiver Signature _______________________________ Date __________  Clinician Signature _______________________________________ Date __________  Please bring this form and your medication list with you to all your follow-up doctor's appointments.

## 2017-01-06 NOTE — Progress Notes (Signed)
Occupational Therapy Discharge Summary  Patient Details  Name: Stacy Morris MRN: [id number] Date of Birth: Sep 03, 1933  Today's Date: 01/06/2017 OT Individual Time: 1300-1400 OT Individual Time Calculation (min): 60 min    Session Note:  Had pt propel her wheelchair down to the ADL apartment with modified independence to begin session.  Once in the apartment, had pt practice tub/shower transfers, toilet transfers, and bed transfers using the RW for support.  She was able to complete all with supervision but demonstrate 2-3 small losses of balance, which she was able to regain.  One occurred when her right foot caught the carpet walking into the bathroom.  The other two were when she was stopping and turning or pivoting.  Instructed pt that this is why we were still wanting her to have initial 24 hour supervision.  Had pt ambulate with RW to gather items around the room and then bring back to central location for folding.  She was able to complete with supervision.  Had pt finish session by rolling herself back to her room in the wheelchair.  Pt still needs mod assist for removal and placement of her foot rests.  Pt left at bedside in wheelchair with call button and phone in reach.    Patient has met 9 of 9 long term goals due to improved activity tolerance, improved balance and ability to compensate for deficits.  Patient to discharge at overall Supervision level.  Patient's care partner is independent to provide the necessary physical assistance at discharge.    Reasons goals not met: NA  Recommendation:  Patient will benefit from ongoing skilled OT services in home health setting to continue to advance functional skills in the area of BADL, iADL and Reduce care partner burden.  Pt is approaching modified independent for functional transfers but still needs supervision secondary to decreased consistency.  In addition, she still needs supervision for LB selfcare secondary to  decreased overall  strength.  Feel she will benefit from [facility] to progress to modified independent level for all ADLs.    Equipment: tub bench, RW  Reasons for discharge: treatment goals met and discharge from hospital  Patient/family agrees with progress made and goals achieved: Yes  OT Discharge Precautions/Restrictions  Precautions Precautions: Fall Restrictions Weight Bearing Restrictions: No LLE Weight Bearing: Weight bearing as tolerated  Pain Pain Assessment Pain Assessment: No/denies pain ADL ADL ADL Comments: Please see functional navigator for ADL status Vision Baseline Vision/History: Wears glasses Wears Glasses: At all times Patient Visual Report: No change from baseline Vision Assessment?: No apparent visual deficits Perception  Perception: Within Functional Limits Praxis Praxis: Intact Cognition Overall Cognitive Status: Within Functional Limits for tasks assessed Orientation Level: Oriented X4 Attention: Selective;Sustained Sustained Attention: Appears intact Selective Attention: Appears intact Memory: Appears intact Awareness: Appears intact Problem Solving: Appears intact Safety/Judgment: Appears intact Sensation Sensation Light Touch: Appears Intact Stereognosis: Appears Intact Hot/Cold: Appears Intact Proprioception: Appears Intact Coordination Gross Motor Movements are Fluid and Coordinated: Yes Fine Motor Movements are Fluid and Coordinated: Yes Coordination and Movement Description: BUE gross and FM coordination [place] Motor  Motor Motor - Discharge Observations: Generalized weakness Mobility  Transfers Transfers: Sit to Stand;Stand to Sit Sit to Stand: 5: Supervision;With armrests;From toilet Stand to Sit: 5: Supervision;With upper extremity assist;To toilet;With armrests  Trunk/Postural Assessment  Cervical Assessment Cervical Assessment: Within Functional Limits Thoracic Assessment Thoracic Assessment: Exceptions to WFL(Slight thoracic  kyphosis) Lumbar Assessment Lumbar Assessment: Within Functional Limits Postural Control Postural Control: Within Functional Limits  Balance Balance Balance Assessed: Yes Static Sitting Balance Static Sitting - Balance Support: No upper extremity supported Static Sitting - Level of Assistance: 7: Independent Dynamic Sitting Balance Dynamic Sitting - Balance Support: During functional activity Dynamic Sitting - Level of Assistance: 6: Modified independent (Device/Increase time) Static Standing Balance Static Standing - Balance Support: During functional activity Static Standing - Level of Assistance: 5: Stand by assistance Dynamic Standing Balance Dynamic Standing - Balance Support: During functional activity Dynamic Standing - Level of Assistance: 5: Stand by assistance Extremity/Trunk Assessment RUE Assessment RUE Assessment: Within Functional Limits(overall strength 4/5 throughout) LUE Assessment LUE Assessment: Within Functional Limits(overall strength 4/5 throughout)   See Function Navigator for Current Functional Status.  Ashantae Pangallo OTR/L 01/06/2017, 4:02 PM

## 2017-01-06 NOTE — Progress Notes (Addendum)
Physical Therapy Discharge Summary  Patient Details  Name: Stacy Morris MRN: [id number] Date of Birth: 1933/05/18  Today's Date: 01/06/2017 PT Individual Time: 0805-0900 AND 1450-1600 PT Individual Time Calculation (min): 55 min AND 70 min  Session 1:  Pt washing up upon arrival w/ NT, requesting to finish prior to session, delayed start by 5 minutes. In w/c at beginning of session, no c/o pain. Worked on functional mobility and gait as detailed below including gait w/ obstacle negotiation to simulate home environment and negotiating 4 steps w/ Min guard. All bouts of gait performed at supervision level. Additionally worked on quality of gait w/ RW for energy conservation as pt tends to perform step-to pattern. Increased LLE pain w/ step-through pattern. Encouraged pt to challenge herself as she continues to recover w/ maintaining a more normal gait pattern w/ step-through. Pt also in agreement that she requires 24/7 supervision level assist at home for gait, stairs, and transfers. Pt self-propelled w/c around unit in 150' bouts w/ supervision using BUEs, returned to room Total A for time management. Practiced ambulating to/from toilet in bathroom using RW w/ supervision to simulate toileting at home. Ended session in w/c, call bell within reach and all needs met. Ice applied to L knee.   Session 2:   Pt in w/c upon arrival and agreeable to therapy, no c/o pain. Continued w/ d/c education as pt's son was in room. Discussed continued need for supervision w/ OOB mobility at this time. Son reports family has arranged for 24/7 care for at least 2-3 weeks, after which they will reevaluate pt's functional independence. Educated both on typical progression of function considering pt's diagnosis and age, that she may require some time of physical assistance for weeks to months but will continue to build strength and improve balance. All verbalized understanding and in agreement. Performed car transfer w/ pt  using RW and supervision and instructed and reviewed HEP w/ pt as listed below. Issued handout for reference and discussed importance of performing standing exercises w/ supervision, pt in agreement. Worked on endurance rest of session as pt reporting increased pain w/ standing and gait this afternoon. Performed NuStep 15 min using BUEs and LEs for endurance and to facilitate increased L knee ROM. Returned to room Total A in w/c, assisted pt w/ toileting while ambulating to/from toilet w/ supervision and ended session supine in bed, call bell within reach and all needs met.   LE strengthening exercises: -alternating knee marches in seated, 1x10  -alternating LAQs in seated, 1x10  -passive L quad stretch, 10-15 sec hold  -L standing knee marches, 1x10   Patient has met 8 of 9 long term goals due to improved activity tolerance, improved balance, increased strength, increased range of motion, decreased pain, ability to compensate for deficits and functional use of  left lower extremity.  Patient to discharge at an ambulatory level Supervision.   Patient's care partner (daughter) is independent to provide the necessary physical assistance at discharge.  Reasons goals not met: WB status of LLE was upgraded from NWB to WBAT within last few days of admission, resulting in decreased time spent practicing supervision level transfers w/ RW. She will remain at supervision level at discharge, however a quick progression to Mod I upon discharge is anticipated.   Recommendation:  Patient will benefit from ongoing skilled PT services in home health setting to continue to advance safe functional mobility, address ongoing impairments in endurance, LLE ROM and strength, quality of gait, , and minimize  fall risk.  Equipment: w/c, RW  Reasons for discharge: treatment goals met and discharge from hospital  Patient/family agrees with progress made and goals achieved: Yes  PT  Discharge Precautions/Restrictions Precautions Precautions: Fall Restrictions Weight Bearing Restrictions: Yes LLE Weight Bearing: Weight bearing as tolerated Pain Pain Assessment Pain Assessment: No/denies pain Pain Score: 0-No pain Vision/Perception  Perception Perception: Within Functional Limits Praxis Praxis: Intact  Cognition Overall Cognitive Status: Within Functional Limits for tasks assessed Orientation Level: Oriented X4 Attention: Selective Sustained Attention: Appears intact Selective Attention: Appears intact Memory: Appears intact Awareness: Appears intact Problem Solving: Appears intact Safety/Judgment: Appears intact Sensation Sensation Light Touch: Appears Intact Proprioception: Appears Intact Coordination Gross Motor Movements are Fluid and Coordinated: No(decreased coordination 2/2 apprehension w/ LLE movement now that pt is WBAT) Fine Motor Movements are Fluid and Coordinated: Yes Motor  Motor Motor: Other (comment) Motor - Discharge Observations: Generalized weakness  Mobility Bed Mobility Bed Mobility: Rolling Right;Rolling Left;Supine to Sit;Sit to Supine Rolling Right: 6: Modified independent (Device/Increase time) Rolling Left: 6: Modified independent (Device/Increase time) Supine to Sit: 6: Modified independent (Device/Increase time) Sit to Supine: 6: Modified independent (Device/Increase time) Transfers Transfers: Yes Sit to Stand: 5: Supervision Stand to Sit: 5: Supervision Stand Pivot Transfers: 5: Supervision Locomotion  Ambulation Ambulation: Yes Ambulation/Gait Assistance: 5: Supervision Ambulation Distance (Feet): 100 Feet Assistive device: Rolling walker Ambulation/Gait Assistance Details: Verbal cues for safe use of DME/AE;Verbal cues for precautions/safety Gait Gait: Yes Gait Pattern: Impaired Gait Pattern: Step-to pattern;Decreased stance time - left;Decreased hip/knee flexion - left;Antalgic Stairs / Additional  Locomotion Stairs: Yes Stairs Assistance: 4: Min guard Stair Management Technique: Two rails Number of Stairs: 4 Height of Stairs: 6 Wheelchair Mobility Wheelchair Mobility: Yes Wheelchair Assistance: 5: Careers information officer: Both upper extremities Wheelchair Parts Management: Independent Distance: 150'  Trunk/Postural Assessment  Cervical Assessment Cervical Assessment: Within Functional Limits Thoracic Assessment Thoracic Assessment: Exceptions to WFL(kyphotic) Lumbar Assessment Lumbar Assessment: Within Functional Limits Postural Control Postural Control: Within Functional Limits  Balance Balance Balance Assessed: Yes Dynamic Sitting Balance Dynamic Sitting - Level of Assistance: 6: Modified independent (Device/Increase time) Static Standing Balance Static Standing - Level of Assistance: 5: Stand by assistance Dynamic Standing Balance Dynamic Standing - Level of Assistance: 5: Stand by assistance Extremity Assessment  RLE Assessment RLE Assessment: Within Functional Limits LLE Assessment LLE Assessment: Exceptions to [facility] LLE Strength LLE Overall Strength Comments: globally 4/5, no pain   See Function Navigator for Current Functional Status.  Efosa Treichler K Arnette 01/06/2017, 10:59 AM

## 2017-01-07 NOTE — Progress Notes (Signed)
Discharge instructions reviewed with pt yesterday per pt. Pt with other questions or concerns this AM. Pt son taking home. Son with no questions or concerns at this time. Pt escorted by wheelchair by NT Leann to ground level for discharge @0910 .Marland Kitchen

## 2017-01-07 NOTE — Progress Notes (Signed)
Subjective/Complaints: No new complaints today.  Excited to be going home  ROS: pt denies nausea, vomiting, diarrhea, cough, shortness of breath or chest pain   Objective: Vital Signs: Blood pressure (!) 139/59, pulse 73, temperature 97.8 F (36.6 C), temperature source Oral, resp. rate 12, height 5' 7"  (1.702 m), weight 104 kg (229 lb 4.5 oz), SpO2 99 %. No results found. Results for orders placed or performed during the hospital encounter of 12/23/16 (from the past 72 hour(s))  CBC with Differential/Platelet     Status: Abnormal   Collection Time: 01/05/17  4:36 AM  Result Value Ref Range   WBC 9.4 4.0 - 10.5 K/uL   RBC 2.47 (L) 3.87 - 5.11 MIL/uL   Hemoglobin 8.1 (L) 12.0 - 15.0 g/dL   HCT 25.2 (L) 36.0 - 46.0 %   MCV 102.0 (H) 78.0 - 100.0 fL   MCH 32.8 26.0 - 34.0 pg   MCHC 32.1 30.0 - 36.0 g/dL   RDW 14.7 11.5 - 15.5 %   Platelets 280 150 - 400 K/uL   Neutrophils Relative % 53 %   Lymphocytes Relative 33 %   Monocytes Relative 11 %   Eosinophils Relative 3 %   Basophils Relative 0 %   Band Neutrophils 0 %   Metamyelocytes Relative 0 %   Myelocytes 0 %   Promyelocytes Absolute 0 %   Blasts 0 %   nRBC 0 0 /100 WBC   Other 0 %   Neutro Abs 5.0 1.7 - 7.7 K/uL   Lymphs Abs 3.1 0.7 - 4.0 K/uL   Monocytes Absolute 1.0 0.1 - 1.0 K/uL   Eosinophils Absolute 0.3 0.0 - 0.7 K/uL   Basophils Absolute 0.0 0.0 - 0.1 K/uL   RBC Morphology POLYCHROMASIA PRESENT   Creatinine, serum     Status: Abnormal   Collection Time: 01/06/17  5:10 AM  Result Value Ref Range   Creatinine, Ser 2.70 (H) 0.44 - 1.00 mg/dL   GFR calc non Af Amer 15 (L) >60 mL/min   GFR calc Af Amer 18 (L) >60 mL/min    Comment: (NOTE) The eGFR has been calculated using the CKD EPI equation. This calculation has not been validated in all clinical situations. eGFR's persistently <60 mL/min signify possible Chronic Kidney Disease.     General: NAD. Vital signs reviewed.  Heart: RRR without murmur. No JVD  . Lungs: CTA Bilaterally without wheezes or rales. Normal effort  Abdomen: Positive bowel sounds, nondistended Musculoskeletal: LLE edema and mild tenderness Neurologic:  Motor: 5/5 in bilateral deltoid, bicep, tricep, grip,  5/5 right hip flexor, knee extensors, ankle dorsiflexor and plantar flexor 4+/5 left hip flexors, knee extensors, 5/5 ADF/PF (stable) Skin: Intact. Warm and dry.   Assessment/Plan: 1. Functional deficits secondary to left periprosthetic distal femur fracture which require 3+ hours per day of interdisciplinary therapy in a comprehensive inpatient rehab setting. Physiatrist is providing close team supervision and 24 hour management of active medical problems listed below. Physiatrist and rehab team continue to assess barriers to discharge/monitor patient progress toward functional and medical goals. FIM: Function - Bathing Position: Wheelchair/chair at sink Body parts bathed by patient: Right arm, Left arm, Chest, Abdomen, Front perineal area, Buttocks, Right upper leg, Left upper leg, Right lower leg, Left lower leg, Back Body parts bathed by helper: Back Assist Level: Supervision or verbal cues  Function- Upper Body Dressing/Undressing What is the patient wearing?: Pull over shirt/dress, Bra Bra - Perfomed by patient: Thread/unthread left bra strap, Thread/unthread right  bra strap, Hook/unhook bra (pull down sports bra) Bra - Perfomed by helper: Hook/unhook bra (pull down sports bra) Pull over shirt/dress - Perfomed by patient: Thread/unthread right sleeve, Thread/unthread left sleeve, Put head through opening, Pull shirt over trunk Assist Level: Supervision or verbal cues Function - Lower Body Dressing/Undressing What is the patient wearing?: Underwear, Pants, Non-skid slipper socks, Maryln Manuel, Shoes Position: Education officer, museum at [employer] - Performed by patient: Thread/unthread right underwear leg, Thread/unthread left underwear leg, Pull underwear  up/down Underwear - Performed by helper: Pull underwear up/down Pants- Performed by patient: Thread/unthread right pants leg, Thread/unthread left pants leg, Pull pants up/down Pants- Performed by helper: Pull pants up/down Non-skid slipper socks- Performed by helper: Don/doff left sock Shoes - Performed by patient: Don/doff right shoe, Don/doff left shoe, Fasten right, Fasten left Shoes - Performed by helper: Don/doff right shoe TED Hose - Performed by helper: Don/doff right TED hose, Don/doff left TED hose Assist for footwear: Maximal assist Assist for lower body dressing: Supervision or verbal cues  Function - Toileting Toileting activity did not occur: No continent bowel/bladder event Toileting steps completed by patient: Adjust clothing prior to toileting, Performs perineal hygiene, Adjust clothing after toileting Toileting steps completed by helper: Adjust clothing after toileting Toileting Assistive Devices: Grab bar or rail Assist level: Supervision or verbal cues  Function - Air cabin crew transfer activity did not occur: Refused Toilet transfer assistive device: Elevated toilet seat/BSC over toilet, Grab bar Assist level to toilet: Supervision or verbal cues Assist level from toilet: Supervision or verbal cues Assist level to bedside commode (at bedside): Maximal assist (Pt 25 - 49%/lift and lower) Assist level from bedside commode (at bedside): Maximal assist (Pt 25 - 49%/lift and lower)  Function - Chair/bed transfer Chair/bed transfer method: Stand pivot, Ambulatory Chair/bed transfer assist level: Supervision or verbal cues Chair/bed transfer assistive device: Armrests, Walker  Function - Locomotion: Wheelchair Will patient use wheelchair at discharge?: Yes Type: Manual Max wheelchair distance: 150' Assist Level: Supervision or verbal cues Assist Level: Supervision or verbal cues Wheel 150 feet activity did not occur: Safety/medical concerns Assist Level:  Supervision or verbal cues Turns around,maneuvers to table,bed, and toilet,negotiates 3% grade,maneuvers on rugs and over doorsills: No Function - Locomotion: Ambulation Assistive device: Walker-rolling Max distance: 100' Assist level: Supervision or verbal cues Assist level: Supervision or verbal cues Walk 50 feet with 2 turns activity did not occur: Safety/medical concerns Assist level: Supervision or verbal cues Walk 150 feet activity did not occur: Safety/medical concerns(increased LLE pain w/ prolonged standing/gait) Walk 10 feet on uneven surfaces activity did not occur: Safety/medical concerns  Function - Comprehension Comprehension: Auditory Comprehension assistive device: Hearing aids Comprehension assist level: Follows complex conversation/direction with extra time/assistive device  Function - Expression Expression: Verbal Expression assist level: Expresses complex ideas: With no assist  Function - Social Interaction Social Interaction assist level: Interacts appropriately with others - No medications needed.  Function - Problem Solving Problem solving assist level: Solves complex 90% of the time/cues < 10% of the time  Function - Memory Memory assist level: Recognizes or recalls 90% of the time/requires cueing < 10% of the time Patient normally able to recall (first 3 days only): Current season, Staff names and faces, That he or she is in a hospital, Location of own room   Medical Problem List and Plan: 1.  Decreased functional mobility with gait disorder and impairment of ADLs secondary to left supracondylar fracture of the femur s/p ORIF 12/20/2016.  Discharged home today.  Goals met   Will see patient for transitional care management in 1-2 weeks   Xray ordered by Ortho, reviewed, showing healing, now WBAT 2.  DVT Prophylaxis/Anticoagulation: Subcutaneous Lovenox.    Vascular study neg for DVT 3. Pain Management: Oxycodone as needed   Controlled on 11/23 4.  Mood: Celexa 40 mg daily, Xanax 0.5 mg 3 times a day as needed 5. Neuropsych: This patient is capable of making decisions on her own behalf. 6. Skin/Wound Care: Routine skin checks 7. Fluids/Electrolytes/Nutrition: Routine I&O's 8. Acute blood loss anemia.    Hb 8.1 on 11/22   Cont to monitor 9. CKD stage IV.    Cr. 2.70 on 11/23   Encourage fluids    Will need ambulatory follow up   Cont to monitor 10. Hypothyroidism. Continue Synthroid 11. Rheumatoid arthritis. Continue chronic prednisone 5 mg daily. Patient receiving Enbrel 50 mg weekly 12. History of gout. Patient receiving allopurinol daily 13. Constipation. Laxative assistance 14. HTN    On 4 meds at home, norvasc cozaar and tenormin are listed   Norvasc increased to 44m on 11/13   Cozaar increased to 100 on 11/19  Remains uncontrolled, Tenormin 25 started on 11/20  Renal ultrasound showing chronic medical renal disease     Vitals:   01/06/17 1414 01/07/17 0520  BP: (!) 105/43 (!) 139/59  Pulse: 67 73  Resp: 16 12  Temp: 98.4 F (36.9 C) 97.8 F (36.6 C)  SpO2: 97% 99%  15. Leukocytosis: resolved  WBCs 9.4 on 11/22  Afebrile   UA relatively unremarkable  LOS (Days) 15 A FACE TO FACE EVALUATION WAS PERFORMED  Donja Tipping T 01/07/2017, 8:04 AM

## 2017-01-08 NOTE — Discharge Summary (Signed)
Discharge summary job # [id number]

## 2017-01-09 ENCOUNTER — Telehealth: Payer: Self-pay

## 2017-01-09 NOTE — Discharge Summary (Signed)
Stacy Morris, Stacy Morris                 ACCOUNT NO.:  [id number]  MEDICAL RECORD NO.:  [id number]  LOCATION:                                 FACILITY:  PHYSICIAN:  Delice Lesch, MD        DATE OF BIRTH:  [birth year]  DATE OF ADMISSION:  12/23/2016 DATE OF DISCHARGE:  01/07/2017                              DISCHARGE SUMMARY   DISCHARGE DIAGNOSES: 1. Left supracondylar fracture of the femur, status post open     reduction and internal fixation on December 20, 2016, subcutaneous     Lovenox for DVT prophylaxis. 2. Pain management. 3. Depression. 4. Acute blood loss anemia. 5. Chronic kidney disease stage 4. 6. Hypothyroidism. 7. Rheumatoid arthritis. 8. History of gout. 9. Constipation. 10.Leukocytosis, resolved.  This is an 83 year old right-handed female with history of rheumatoid arthritis, maintained on Enbrel, CKD stage 3-4, hypertension.  Lives alone, independent with a cane prior to admission, still mows her grass, cooks, cleans, and drives.  Presented on December 20, 2016, after a fall while at church without loss of consciousness.  X-rays revealed a left periprosthetic distal femur fracture.  Underwent ORIF on December 20, 2016, per Dr. Doreatha Martin.  Hospital course, pain management.  Touchdown weightbearing, left lower extremity.  Acute blood loss anemia, 8.3 and monitored.  Subcutaneous Lovenox for DVT prophylaxis.  Physical and occupational therapy ongoing.  The patient was admitted for a comprehensive rehab program.  PAST MEDICAL HISTORY:  See discharge diagnoses.  SOCIAL HISTORY:  Lives alone, independent prior to admission.  FUNCTIONAL STATUS UPON ADMISSION TO REHAB SERVICES:  Moderate assist stand pivot transfers, minimal assist sit to supine, min to mod assist activities daily living.  PHYSICAL EXAMINATION:  VITAL SIGNS:  Blood pressure 158/62, pulse 77, temperature 97, and respirations 18. GENERAL:  Alert female, in no acute distress. HEENT:  EOMs intact. NECK:   Supple, nontender.  No JVD. CARDIAC:  Rate controlled. ABDOMEN:  Soft, nontender.  Good bowel sounds. LUNGS:  Clear to auscultation without wheeze. EXTREMITIES:  Bilateral upper extremities 5/5 to bilateral deltoid, biceps, triceps, and grip.  4-/5 to 4+ right lower extremity.  3-/5 left hip flexors, left knee extensors, left lower extremity.  REHABILITATION HOSPITAL COURSE:  The patient was admitted to Inpatient Rehab Services.  Therapies initiated on a 3-hour daily basis, consisting of physical therapy, occupational therapy, and rehabilitation nursing. The following issues were addressed during the patient's rehabilitation stay.  Pertaining to Mrs. Mccallister's left supracondylar fracture of the femur, she had undergone ORIF on December 20, 2016.  Neurovascular sensation intact.  Her weightbearing was advanced to weightbearing as tolerated.  She would follow up with Orthopedic Services in 1-2 weeks. Remained on subcutaneous Lovenox for DVT prophylaxis throughout her hospital stay.  Venous Doppler studies negative.  Pain management with the use of oxycodone as needed.  Noted history of depression, she remained on Celexa as well as Xanax as needed, emotional support provided, she was attending full therapies.  Acute blood loss anemia, 8.1.  She remained asymptomatic.  CKD stage 3-4, creatinine 2.70.  Plan ambulatory, follow up with primary MD.  She remained on hormone supplement for hypothyroidism.  Rheumatoid arthritis,  chronic prednisone.  She had been receiving Enbrel 50 mg weekly.  This could be resumed as prior to admission.  History of gout, no flare-ups noted, maintained on allopurinol.  Blood pressures controlled with present regimen.  She did receive a renal ultrasound showing chronic medical renal disease, no stenosis noted.  The patient received weekly collaborative interdisciplinary team conferences.  She was propelling her wheelchair supervision, practice ambulating to and from  the toilet, bathroom using rolling walker supervision.  She would use her call bell appropriately.  Worked on functional mobility, ambulation as detailed, obstacle negotiation, navigating stairs with minimal guard.  She could gather her belongings for activities of daily living and homemaking. Practice tub shower transfers, toilet transfers, bed transfers using rolling walker for support.  Full teaching was completed.  Would plan discharge to home.  DISCHARGE MEDICATIONS:  Included: 1. Allopurinol 100 mg p.o. daily. 2. Xanax 0.5 mg t.i.d. as needed. 3. Norvasc 5 mg p.o. at bedtime. 4. Aspirin 81 mg p.o. daily. 5. Atenolol 25 mg p.o. daily. 6. Calcium 1 tablet daily. 7. Questran 4 g by mouth as needed for loose stools. 8. Celexa 40 mg p.o. daily. 9. Enbrel 50 mg weekly. 10.Synthroid 50 mcg p.o. daily. 11.Imodium as needed. 12.Cozaar 100 mg p.o. at bedtime. 13.Prilosec 20 mg p.o. daily. 14.MiraLAX daily as needed.  Hold for loose stool. 15.Prednisone 5 mg p.o. daily.  DIET:  Her diet was regular.  FOLLOWUP:  She would follow up with Dr. Posey Pronto at the Outpatient Rehab Service office as advised; Dr. Katha Hamming, Orthopedic Services, call for appointment; Dr. Juanita Craver, Medical Management.     Lauraine Rinne, P.A.   ______________________________ Delice Lesch, MD    DA/MEDQ  D:  01/08/2017  T:  01/08/2017  Job:  [id number]  cc:   Juanita Craver, M.D. Delice Lesch, MD Dr. Lennette Bihari Haddix

## 2017-01-09 NOTE — Telephone Encounter (Signed)
Transitional Care call  Patient name: Stacy Morris                                   ) DOB: (                             09-Mar-1933      ) 1. Are you/is patient experiencing any problems since coming home? (                      no      ) a. Are there any questions regarding any aspect of care? ( no                           ) 2. Are there any questions regarding medications administration/dosing? (                     no       ) a. Are meds being taken as prescribed? (      yes                      ) b. "Patient should review meds with caller to confirm"  3. Have there been any falls? (        no              ) 4. Has Home Health been to the house and/or have they contacted you? ( Patient has home care 24 hours a day ([facility])                           ) a. If not, have you tried to contact them? (      N/A                        ) b. Can we help you contact them? (                 N/A              ) 5. Are bowels and bladder emptying properly? (          yes              ) a. Are there any unexpected incontinence issues? (       no                           ) b. If applicable, is patient following bowel/bladder programs? (  N/A                        ) 6. Any fevers, problems with breathing, unexpected pain? (         no                       ) 7. Are there any skin problems or new areas of breakdown? ( no                                  )  8. Has the patient/family member arranged specialty MD follow up (ie cardiology/neurology/renal/surgical/etc.)?  (      no                             ) a. Can we help arrange? (             no                        ) 9. Does the patient need any other services or support that we can help arrange? (                no           ) 10. Are caregivers following through as expected in assisting the patient? (                     yes        ) 11. Has the patient quit smoking, drinking alcohol, or using drugs as recommended? (         yes                )  Appointment date/time (          01/20/17                        ),  arrive time (              10:30am                      )  and who it is with here Posey Pronto                                   ) [place]

## 2017-01-20 ENCOUNTER — Encounter: Payer: Self-pay | Admitting: Physical Medicine & Rehabilitation

## 2017-01-20 ENCOUNTER — Other Ambulatory Visit: Payer: Self-pay

## 2017-01-20 ENCOUNTER — Encounter: Payer: Medicare Other | Attending: Physical Medicine & Rehabilitation | Admitting: Physical Medicine & Rehabilitation

## 2017-01-20 VITALS — BP 129/76 | HR 56

## 2017-01-20 DIAGNOSIS — I150 Renovascular hypertension: Secondary | ICD-10-CM | POA: Diagnosis not present

## 2017-01-20 DIAGNOSIS — Z96653 Presence of artificial knee joint, bilateral: Secondary | ICD-10-CM | POA: Insufficient documentation

## 2017-01-20 DIAGNOSIS — M109 Gout, unspecified: Secondary | ICD-10-CM | POA: Diagnosis not present

## 2017-01-20 DIAGNOSIS — M069 Rheumatoid arthritis, unspecified: Secondary | ICD-10-CM | POA: Diagnosis not present

## 2017-01-20 DIAGNOSIS — Z90710 Acquired absence of both cervix and uterus: Secondary | ICD-10-CM | POA: Insufficient documentation

## 2017-01-20 DIAGNOSIS — G8918 Other acute postprocedural pain: Secondary | ICD-10-CM | POA: Diagnosis not present

## 2017-01-20 DIAGNOSIS — Z8249 Family history of ischemic heart disease and other diseases of the circulatory system: Secondary | ICD-10-CM | POA: Diagnosis not present

## 2017-01-20 DIAGNOSIS — X58XXXD Exposure to other specified factors, subsequent encounter: Secondary | ICD-10-CM | POA: Diagnosis not present

## 2017-01-20 DIAGNOSIS — I129 Hypertensive chronic kidney disease with stage 1 through stage 4 chronic kidney disease, or unspecified chronic kidney disease: Secondary | ICD-10-CM | POA: Diagnosis not present

## 2017-01-20 DIAGNOSIS — F419 Anxiety disorder, unspecified: Secondary | ICD-10-CM | POA: Insufficient documentation

## 2017-01-20 DIAGNOSIS — S72462S Displaced supracondylar fracture with intracondylar extension of lower end of left femur, sequela: Secondary | ICD-10-CM | POA: Diagnosis not present

## 2017-01-20 DIAGNOSIS — K219 Gastro-esophageal reflux disease without esophagitis: Secondary | ICD-10-CM | POA: Diagnosis not present

## 2017-01-20 DIAGNOSIS — S72492S Other fracture of lower end of left femur, sequela: Secondary | ICD-10-CM

## 2017-01-20 DIAGNOSIS — M1990 Unspecified osteoarthritis, unspecified site: Secondary | ICD-10-CM | POA: Diagnosis not present

## 2017-01-20 DIAGNOSIS — E039 Hypothyroidism, unspecified: Secondary | ICD-10-CM | POA: Diagnosis not present

## 2017-01-20 DIAGNOSIS — D62 Acute posthemorrhagic anemia: Secondary | ICD-10-CM | POA: Diagnosis not present

## 2017-01-20 DIAGNOSIS — N184 Chronic kidney disease, stage 4 (severe): Secondary | ICD-10-CM | POA: Diagnosis not present

## 2017-01-20 DIAGNOSIS — Z9841 Cataract extraction status, right eye: Secondary | ICD-10-CM | POA: Diagnosis not present

## 2017-01-20 DIAGNOSIS — Z79899 Other long term (current) drug therapy: Secondary | ICD-10-CM | POA: Insufficient documentation

## 2017-01-20 DIAGNOSIS — S72452D Displaced supracondylar fracture without intracondylar extension of lower end of left femur, subsequent encounter for closed fracture with routine healing: Secondary | ICD-10-CM | POA: Diagnosis not present

## 2017-01-20 DIAGNOSIS — Z85828 Personal history of other malignant neoplasm of skin: Secondary | ICD-10-CM | POA: Insufficient documentation

## 2017-01-20 DIAGNOSIS — R269 Unspecified abnormalities of gait and mobility: Secondary | ICD-10-CM | POA: Diagnosis not present

## 2017-01-20 DIAGNOSIS — F329 Major depressive disorder, single episode, unspecified: Secondary | ICD-10-CM | POA: Insufficient documentation
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
## 2017-03-23 ENCOUNTER — Ambulatory Visit: Payer: Medicare Other | Admitting: Physical Medicine & Rehabilitation

## 2017-05-09 ENCOUNTER — Other Ambulatory Visit: Payer: Self-pay | Admitting: Physician Assistant

## 2017-05-09 DIAGNOSIS — Z1231 Encounter for screening mammogram for malignant neoplasm of breast: Secondary | ICD-10-CM

## 2017-06-05 ENCOUNTER — Ambulatory Visit
Admission: RE | Admit: 2017-06-05 | Discharge: 2017-06-05 | Disposition: A | Discharge status: Home / Self Care | Payer: Medicare Other | Source: Ambulatory Visit | Attending: Physician Assistant | Admitting: Physician Assistant

## 2017-06-05 DIAGNOSIS — Z1231 Encounter for screening mammogram for malignant neoplasm of breast: Secondary | ICD-10-CM

## 2017-08-15 ENCOUNTER — Other Ambulatory Visit: Payer: Self-pay | Admitting: Physician Assistant

## 2017-08-15 ENCOUNTER — Ambulatory Visit
Admission: RE | Admit: 2017-08-15 | Discharge: 2017-08-15 | Disposition: A | Discharge status: Home / Self Care | Payer: Medicare Other | Source: Ambulatory Visit | Attending: Physician Assistant | Admitting: Physician Assistant

## 2017-08-15 DIAGNOSIS — S6992XA Unspecified injury of left wrist, hand and finger(s), initial encounter: Secondary | ICD-10-CM

## 2017-08-15 DIAGNOSIS — M8580 Other specified disorders of bone density and structure, unspecified site: Secondary | ICD-10-CM

## 2017-09-28 ENCOUNTER — Ambulatory Visit
Admission: RE | Admit: 2017-09-28 | Discharge: 2017-09-28 | Disposition: A | Discharge status: Home / Self Care | Payer: Medicare Other | Source: Ambulatory Visit | Attending: Physician Assistant | Admitting: Physician Assistant

## 2017-09-28 DIAGNOSIS — M8580 Other specified disorders of bone density and structure, unspecified site: Secondary | ICD-10-CM

## 2018-04-20 IMAGING — US US CAROTID DUPLEX BILAT
1 series · 13 of 24 positions shown · non-contrast
Comparison: None.

CLINICAL DATA: Bilateral carotid bruits.

EXAM:
BILATERAL CAROTID DUPLEX ULTRASOUND
TECHNIQUE: Gray scale imaging, color Doppler and duplex ultrasound were
performed of bilateral carotid and vertebral arteries in the neck.

[Series 1: us carotid duplex bilat · 0.08mm/px · 13 of 65 slices shown]
[im 1/65]
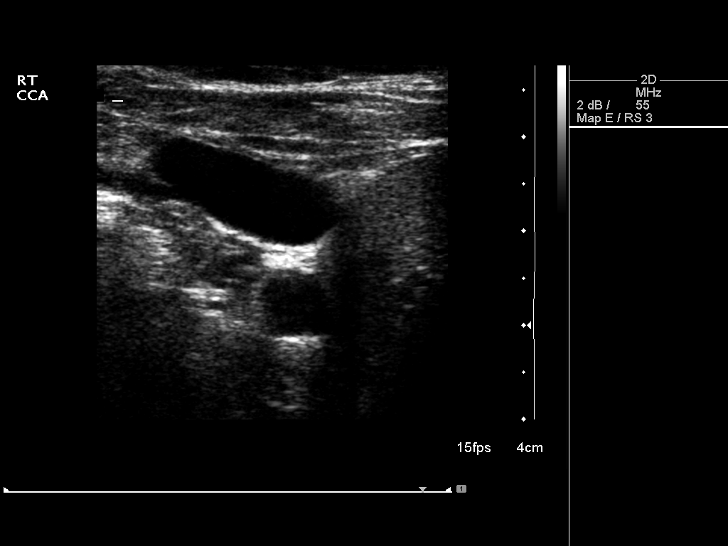
[im 6/65]
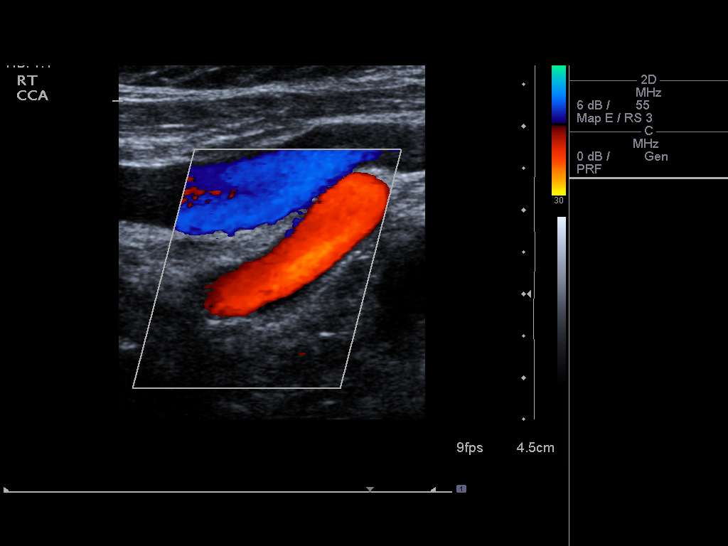
[im 12/65]
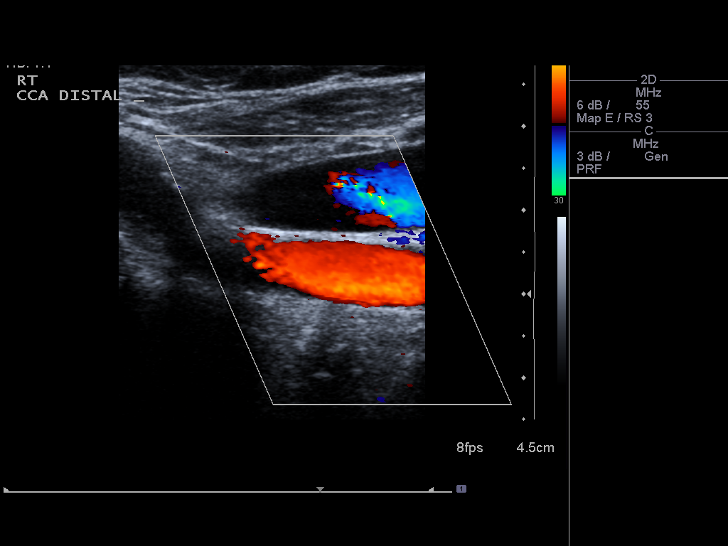
[im 17/65]
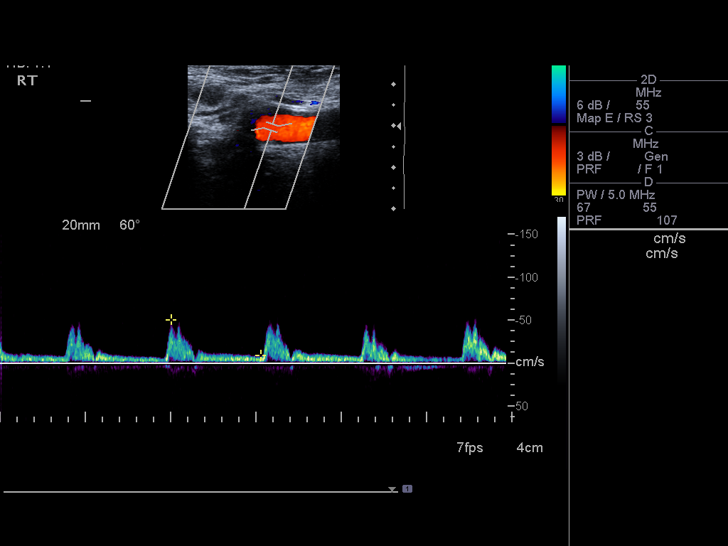
[im 23/65]
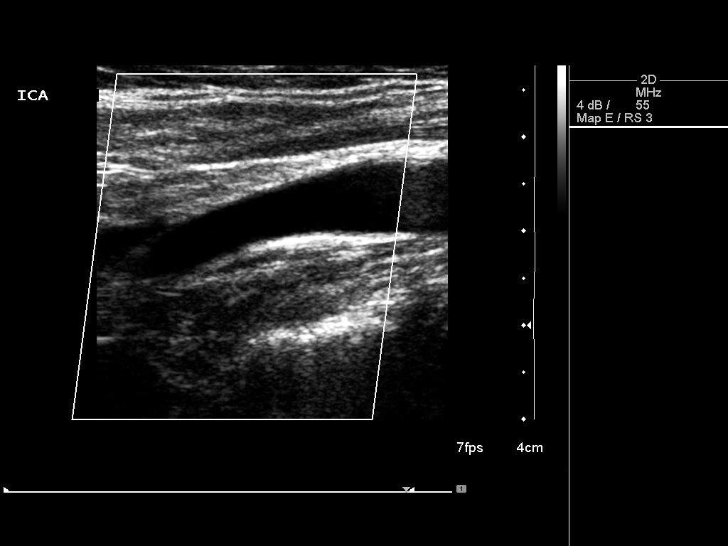
[im 28/65]
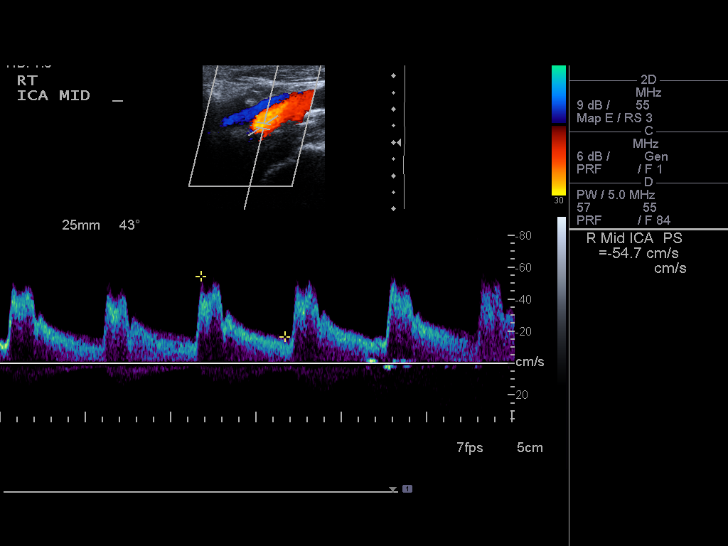
[im 34/65]
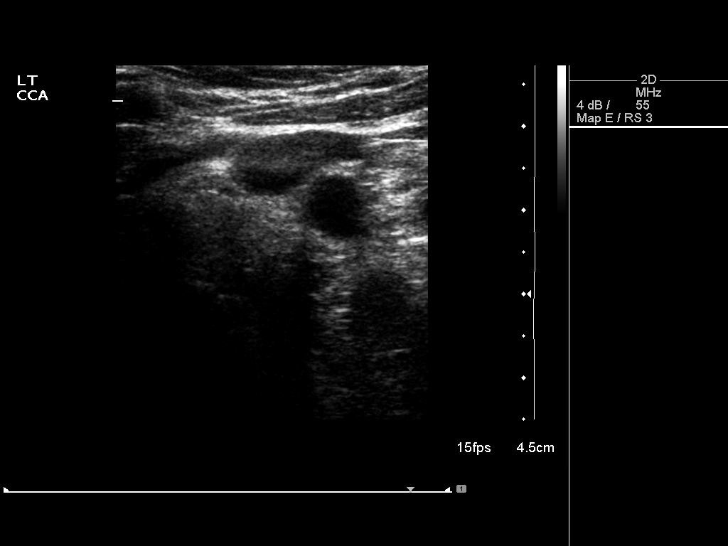
[im 37/65]
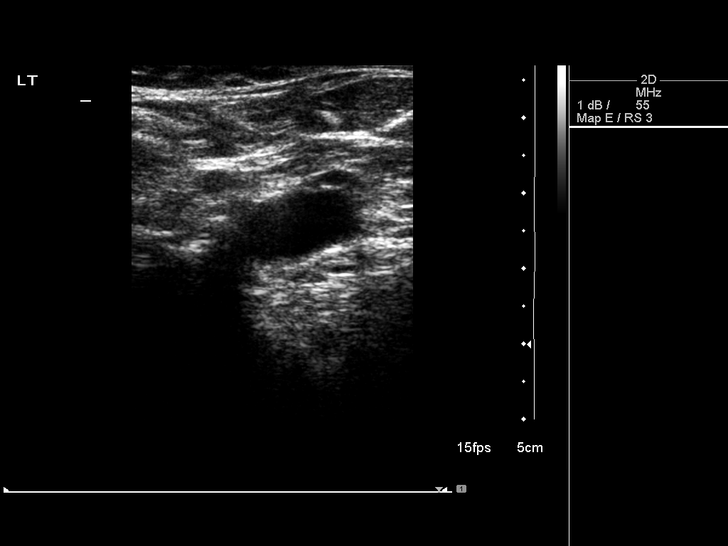
[im 42/65]
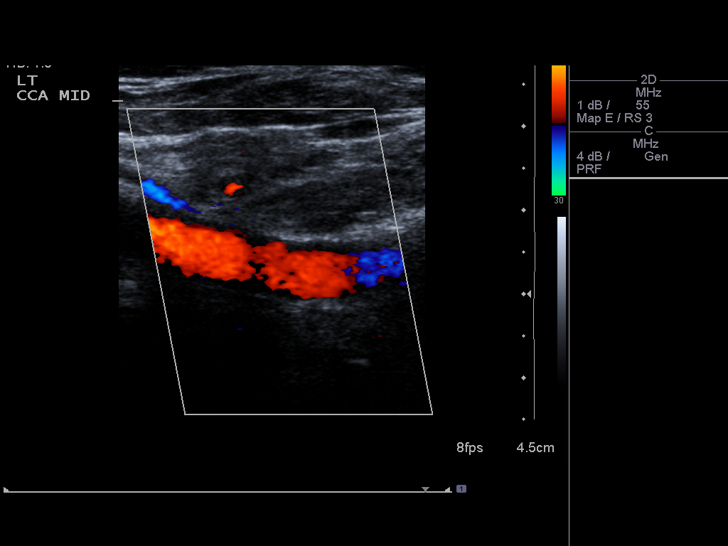
[im 48/65]
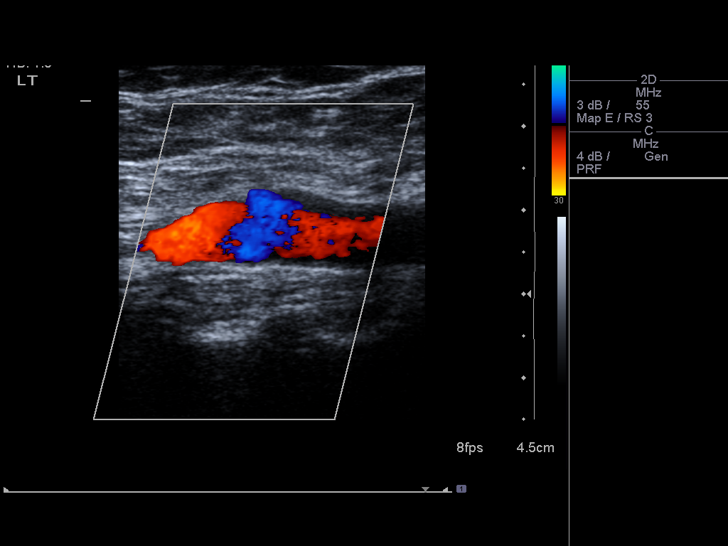
[im 53/65]
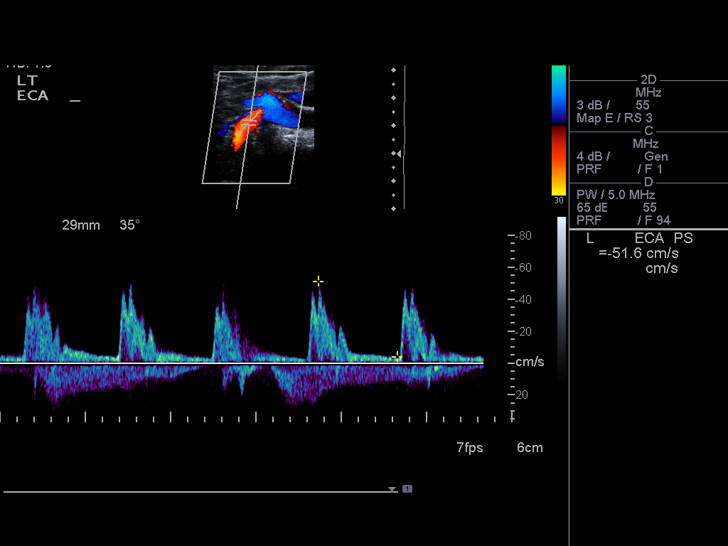
[im 59/65]
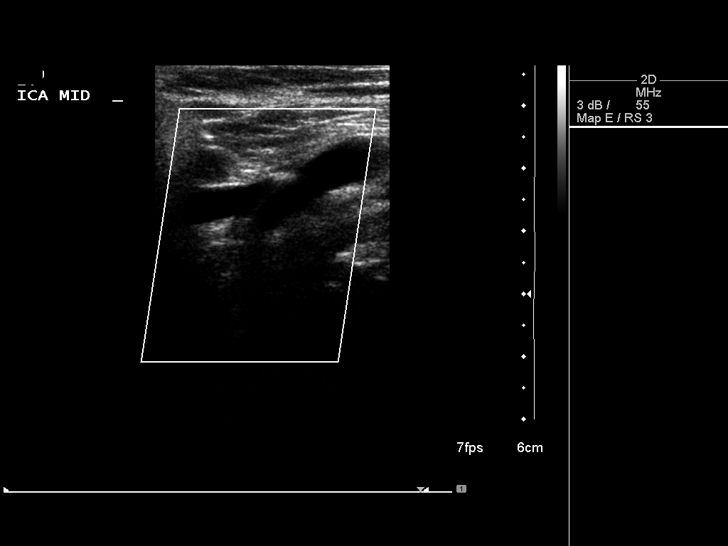
[im 65/65]
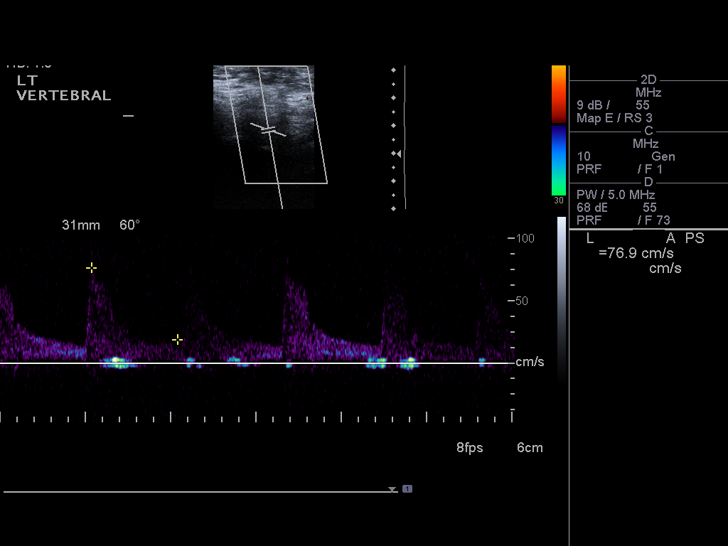

[13 of 24 positions shown; findings below may reference images not displayed]

FINDINGS: Criteria: Quantification of carotid stenosis is based on velocity
parameters that correlate the residual internal carotid diameter
with NASCET-based stenosis levels, using the diameter of the distal
internal carotid lumen as the denominator for stenosis measurement.

The following velocity measurements were obtained:

RIGHT

ICA:  61 cm/sec

CCA:  75 cm/sec

SYSTOLIC ICA/CCA RATIO:

DIASTOLIC ICA/CCA RATIO:

ECA:  88 cm/sec

LEFT

ICA:  63 cm/sec

CCA:  87 cm/sec

SYSTOLIC ICA/CCA RATIO:

DIASTOLIC ICA/CCA RATIO:

ECA:  52 cm/sec

RIGHT CAROTID ARTERY: Right carotid arteries are patent without
significant plaque or stenosis.

RIGHT VERTEBRAL ARTERY: Antegrade flow and normal waveform in the
right vertebral artery.

LEFT CAROTID ARTERY: Left carotid arteries are patent without
significant plaque or stenosis.

LEFT VERTEBRAL ARTERY: Antegrade flow and normal waveform in the
left vertebral artery.
IMPRESSION: Normal carotid artery examination. No significant plaque or stenosis
in the carotid arteries.

Patent vertebral arteries with antegrade flow.

## 2018-06-11 ENCOUNTER — Other Ambulatory Visit (HOSPITAL_COMMUNITY): Payer: Medicare Other

## 2018-06-15 ENCOUNTER — Ambulatory Visit (HOSPITAL_COMMUNITY): Admission: RE | Admit: 2018-06-15 | Payer: Medicare Other | Source: Home / Self Care | Admitting: Ophthalmology

## 2018-06-15 ENCOUNTER — Encounter (HOSPITAL_COMMUNITY): Admission: RE | Payer: Self-pay | Source: Home / Self Care

## 2018-06-15 SURGERY — PHACOEMULSIFICATION, CATARACT, WITH IOL INSERTION
Anesthesia: Monitor Anesthesia Care | Laterality: Left

## 2018-06-26 ENCOUNTER — Other Ambulatory Visit: Payer: Self-pay | Admitting: Physician Assistant

## 2018-06-26 DIAGNOSIS — S2000XA Contusion of breast, unspecified breast, initial encounter: Secondary | ICD-10-CM

## 2018-06-26 DIAGNOSIS — N6489 Other specified disorders of breast: Secondary | ICD-10-CM

## 2018-07-13 ENCOUNTER — Other Ambulatory Visit: Payer: Self-pay

## 2018-07-13 ENCOUNTER — Ambulatory Visit
Admission: RE | Admit: 2018-07-13 | Discharge: 2018-07-13 | Disposition: A | Discharge status: Home / Self Care | Payer: Medicare Other | Source: Ambulatory Visit | Attending: Physician Assistant | Admitting: Physician Assistant

## 2018-07-13 ENCOUNTER — Ambulatory Visit: Payer: Medicare Other

## 2018-07-13 DIAGNOSIS — S2000XA Contusion of breast, unspecified breast, initial encounter: Secondary | ICD-10-CM

## 2018-07-13 DIAGNOSIS — N6489 Other specified disorders of breast: Secondary | ICD-10-CM

## 2018-08-22 ENCOUNTER — Encounter (HOSPITAL_COMMUNITY): Admission: RE | Admit: 2018-08-22 | Payer: Medicare Other | Source: Ambulatory Visit

## 2018-08-23 ENCOUNTER — Encounter (HOSPITAL_COMMUNITY): Payer: Self-pay

## 2018-08-23 ENCOUNTER — Other Ambulatory Visit: Payer: Self-pay

## 2018-08-23 ENCOUNTER — Encounter (HOSPITAL_COMMUNITY)
Admission: RE | Admit: 2018-08-23 | Discharge: 2018-08-23 | Disposition: A | Discharge status: Home / Self Care | Payer: Medicare Other | Source: Ambulatory Visit | Attending: Ophthalmology | Admitting: Ophthalmology

## 2018-08-23 ENCOUNTER — Other Ambulatory Visit (HOSPITAL_COMMUNITY)
Admission: RE | Admit: 2018-08-23 | Discharge: 2018-08-23 | Disposition: A | Discharge status: Home / Self Care | Payer: Medicare Other | Source: Ambulatory Visit | Attending: Ophthalmology | Admitting: Ophthalmology

## 2018-08-23 DIAGNOSIS — Z1159 Encounter for screening for other viral diseases: Secondary | ICD-10-CM | POA: Insufficient documentation

## 2018-08-23 DIAGNOSIS — Z01812 Encounter for preprocedural laboratory examination: Secondary | ICD-10-CM | POA: Diagnosis present

## 2018-08-23 HISTORY — DX: Other specified postprocedural states: Z98.890

## 2018-08-23 HISTORY — DX: Unspecified hearing loss, unspecified ear: H91.90

## 2018-08-23 HISTORY — DX: Other specified postprocedural states: R11.2

## 2018-08-23 LAB — BASIC METABOLIC PANEL
Anion gap: 12 (ref 5–15)
BUN: 51 mg/dL — ABNORMAL HIGH (ref 8–23)
CO2: 23 mmol/L (ref 22–32)
Calcium: 9.6 mg/dL (ref 8.9–10.3)
Chloride: 104 mmol/L (ref 98–111)
Creatinine, Ser: 2.79 mg/dL — ABNORMAL HIGH (ref 0.44–1.00)
GFR calc Af Amer: 17 mL/min — ABNORMAL LOW (ref >60)
GFR calc non Af Amer: 15 mL/min — ABNORMAL LOW (ref >60)
Glucose, Bld: 108 mg/dL — ABNORMAL HIGH (ref 70–99)
Potassium: 3.9 mmol/L (ref 3.5–5.1)
Sodium: 139 mmol/L (ref 135–145)

## 2018-08-23 LAB — CBC WITH DIFFERENTIAL/PLATELET
Abs Immature Granulocytes: 0.03 10*3/uL (ref 0.00–0.07)
Basophils Absolute: 0.1 10*3/uL (ref 0.0–0.1)
Basophils Relative: 1 %
Eosinophils Absolute: 0.2 10*3/uL (ref 0.0–0.5)
Eosinophils Relative: 2 %
HCT: 35.5 % — ABNORMAL LOW (ref 36.0–46.0)
Hemoglobin: 11.3 g/dL — ABNORMAL LOW (ref 12.0–15.0)
Immature Granulocytes: 0 %
Lymphocytes Relative: 17 %
Lymphs Abs: 1.6 10*3/uL (ref 0.7–4.0)
MCH: 32.6 pg (ref 26.0–34.0)
MCHC: 31.8 g/dL (ref 30.0–36.0)
MCV: 102.3 fL — ABNORMAL HIGH (ref 80.0–100.0)
Monocytes Absolute: 0.9 10*3/uL (ref 0.1–1.0)
Monocytes Relative: 9 %
Neutro Abs: 6.6 10*3/uL (ref 1.7–7.7)
Neutrophils Relative %: 71 %
Platelets: 207 10*3/uL (ref 150–400)
RBC: 3.47 MIL/uL — ABNORMAL LOW (ref 3.87–5.11)
RDW: 13.8 % (ref 11.5–15.5)
WBC: 9.4 10*3/uL (ref 4.0–10.5)
nRBC: 0 % (ref 0.0–0.2)

## 2018-08-23 NOTE — Patient Instructions (Signed)
Stacy Morris  08/23/2018     @PREFPERIOPPHARMACY @   Your procedure is scheduled on  08/27/2018  Report to [facility] at  855  A.M.  Call this number if you have problems the morning of surgery:  [phone]   Remember:  Do not eat or drink after midnight.                        Take these medicines the morning of surgery with A SIP OF WATER  Allopurinol, xanax(if needed), amlodipine, atenolol, celexa, levothyroxine, prilosec.    Do not wear jewelry, make-up or nail polish.  Do not wear lotions, powders, or perfumes, or deodorant.  Do not shave 48 hours prior to surgery.  Men may shave face and neck.  Do not bring valuables to the hospital.  [facility] is not responsible for any belongings or valuables.  Contacts, dentures or bridgework may not be worn into surgery.  Leave your suitcase in the car.  After surgery it may be brought to your room.  For patients admitted to the hospital, discharge time will be determined by your treatment team.  Patients discharged the day of surgery will not be allowed to drive home.   Name and phone number of your driver:   family Special instructions:  None  Please read over the following fact sheets that you were given. Anesthesia Post-op Instructions and Care and Recovery After Surgery       Cataract Surgery, Care After This sheet gives you information about how to care for yourself after your procedure. Your health care provider may also give you more specific instructions. If you have problems or questions, contact your health care provider. What can I expect after the procedure? After the procedure, it is common to have:  Itching.  Discomfort.  Fluid discharge.  Sensitivity to light and to touch.  Bruising in or around the eye.  Mild blurred vision. Follow these instructions at home: Eye care   Do not touch or rub your eyes.  Protect your eyes as told by your health care provider. You may be told to wear a  protective eye shield or sunglasses.  Do not put a contact lens into the affected eye or eyes until your health care provider approves.  Keep the area around your eye clean and dry: ? Avoid swimming. ? Do not allow water to hit you directly in the face while showering. ? Keep soap and shampoo out of your eyes.  Check your eye every day for signs of infection. Watch for: ? Redness, swelling, or pain. ? Fluid, blood, or pus. ? Warmth. ? A bad smell. ? Vision that is getting worse. ? Sensitivity that is getting worse. Activity  Do not drive for 24 hours if you were given a sedative during your procedure.  Avoid strenuous activities, such as playing contact sports, for as long as told by your health care provider.  Do not drive or use heavy machinery until your health care provider approves.  Do not bend or lift heavy objects. Bending increases pressure in the eye. You can walk, climb stairs, and do light household chores.  Ask your health care provider when you can return to work. If you work in a dusty environment, you may be advised to wear protective eyewear for a period of time. General instructions  Take or apply over-the-counter and prescription medicines only as told by your health care provider.  This includes eye drops.  Keep all follow-up visits as told by your health care provider. This is important. Contact a health care provider if:  You have increased bruising around your eye.  You have pain that is not helped with medicine.  You have a fever.  You have redness, swelling, or pain in your eye.  You have fluid, blood, or pus coming from your incision.  Your vision gets worse.  Your sensitivity to light gets worse. Get help right away if:  You have sudden loss of vision.  You see flashes of light or spots (floaters).  You have severe eye pain.  You develop nausea or vomiting. Summary  After your procedure, it is common to have itching, discomfort,  bruising, fluid discharge, or sensitivity to light.  Follow instructions from your health care provider about caring for your eye after the procedure.  Do not rub your eye after the procedure. You may need to wear eye protection or sunglasses. Do not wear contact lenses. Keep the area around your eye clean and dry.  Avoid activities that require a lot of effort. These include playing sports and lifting heavy objects.  Contact a health care provider if you have increased bruising, pain that does not go away, or a fever. Get help right away if you suddenly lose your vision, see flashes of light or spots, or have severe pain in the eye. This information is not intended to replace advice given to you by your health care provider. Make sure you discuss any questions you have with your health care provider. Document Released: 08/20/2004 Document Revised: 07/31/2017 Document Reviewed: 07/31/2017 Elsevier Patient Education  2020 [place] After These instructions provide you with information about caring for yourself after your procedure. Your health care provider may also give you more specific instructions. Your treatment has been planned according to current medical practices, but problems sometimes occur. Call your health care provider if you have any problems or questions after your procedure. What can I expect after the procedure? After your procedure, you may:  Feel sleepy for several hours.  Feel clumsy and have poor balance for several hours.  Feel forgetful about what happened after the procedure.  Have poor judgment for several hours.  Feel nauseous or vomit.  Have a sore throat if you had a breathing tube during the procedure. Follow these instructions at home: For at least 24 hours after the procedure:      Have a responsible adult stay with you. It is important to have someone help care for you until you are awake and alert.  Rest as  needed.  Do not: ? Participate in activities in which you could fall or become injured. ? Drive. ? Use heavy machinery. ? Drink alcohol. ? Take sleeping pills or medicines that cause drowsiness. ? Make important decisions or sign legal documents. ? Take care of children on your own. Eating and drinking  Follow the diet that is recommended by your health care provider.  If you vomit, drink water, juice, or soup when you can drink without vomiting.  Make sure you have little or no nausea before eating solid foods. General instructions  Take over-the-counter and prescription medicines only as told by your health care provider.  If you have sleep apnea, surgery and certain medicines can increase your risk for breathing problems. Follow instructions from your health care provider about wearing your sleep device: ? Anytime you are sleeping, including during daytime naps. ?  While taking prescription pain medicines, sleeping medicines, or medicines that make you drowsy.  If you smoke, do not smoke without supervision.  Keep all follow-up visits as told by your health care provider. This is important. Contact a health care provider if:  You keep feeling nauseous or you keep vomiting.  You feel light-headed.  You develop a rash.  You have a fever. Get help right away if:  You have trouble breathing. Summary  For several hours after your procedure, you may feel sleepy and have poor judgment.  Have a responsible adult stay with you for at least 24 hours or until you are awake and alert. This information is not intended to replace advice given to you by your health care provider. Make sure you discuss any questions you have with your health care provider. Document Released: 05/24/2015 Document Revised: 05/01/2017 Document Reviewed: 05/24/2015 Elsevier Patient Education  2020 [employer].

## 2018-08-24 LAB — SARS CORONAVIRUS 2 (TAT 6-24 HRS): SARS Coronavirus 2: NEGATIVE

## 2018-08-27 ENCOUNTER — Ambulatory Visit (HOSPITAL_COMMUNITY)
Admission: RE | Admit: 2018-08-27 | Discharge: 2018-08-27 | Disposition: A | Discharge status: Home / Self Care | Payer: Medicare Other | Attending: Ophthalmology | Admitting: Ophthalmology

## 2018-08-27 ENCOUNTER — Other Ambulatory Visit: Payer: Self-pay

## 2018-08-27 ENCOUNTER — Ambulatory Visit (HOSPITAL_COMMUNITY): Payer: Medicare Other | Admitting: Anesthesiology

## 2018-08-27 ENCOUNTER — Encounter (HOSPITAL_COMMUNITY)
Admission: RE | Disposition: A | Discharge status: Home / Self Care | Payer: Self-pay | Source: Home / Self Care | Attending: Ophthalmology

## 2018-08-27 ENCOUNTER — Encounter (HOSPITAL_COMMUNITY): Payer: Self-pay

## 2018-08-27 DIAGNOSIS — H259 Unspecified age-related cataract: Secondary | ICD-10-CM | POA: Diagnosis present

## 2018-08-27 DIAGNOSIS — Z885 Allergy status to narcotic agent status: Secondary | ICD-10-CM | POA: Insufficient documentation

## 2018-08-27 DIAGNOSIS — M1990 Unspecified osteoarthritis, unspecified site: Secondary | ICD-10-CM | POA: Diagnosis not present

## 2018-08-27 DIAGNOSIS — I10 Essential (primary) hypertension: Secondary | ICD-10-CM | POA: Insufficient documentation

## 2018-08-27 HISTORY — PX: CATARACT EXTRACTION W/PHACO: SHX586

## 2018-08-27 SURGERY — PHACOEMULSIFICATION, CATARACT, WITH IOL INSERTION
Anesthesia: Monitor Anesthesia Care | Site: Eye | Laterality: Left

## 2018-08-27 MED ORDER — BSS IO SOLN
INTRAOCULAR | Status: DC | PRN
  Administered 2018-08-27: 15 mL

## 2018-08-27 MED ORDER — NEOMYCIN-POLYMYXIN-DEXAMETH 3.5-10000-0.1 OP SUSP
OPHTHALMIC | Status: DC | PRN
  Administered 2018-08-27: 2 [drp] via OPHTHALMIC

## 2018-08-27 MED ORDER — PHENYLEPHRINE HCL 2.5 % OP SOLN
1.0000 [drp] | OPHTHALMIC | Status: AC
  Administered 2018-08-27 (×3): 1 [drp] via OPHTHALMIC

## 2018-08-27 MED ORDER — LACTATED RINGERS IV SOLN: INTRAVENOUS | Status: DC

## 2018-08-27 MED ORDER — POVIDONE-IODINE 5 % OP SOLN
OPHTHALMIC | Status: DC | PRN
  Administered 2018-08-27: 1 via OPHTHALMIC

## 2018-08-27 MED ORDER — PROVISC 10 MG/ML IO SOLN
INTRAOCULAR | Status: DC | PRN
  Administered 2018-08-27: 0.85 mL via INTRAOCULAR

## 2018-08-27 MED ORDER — EPINEPHRINE PF 1 MG/ML IJ SOLN
INTRAOCULAR | Status: DC | PRN
  Administered 2018-08-27: 500 mL

## 2018-08-27 MED ORDER — LIDOCAINE HCL 3.5 % OP GEL
1.0000 "application " | Freq: Once | OPHTHALMIC | Status: AC
  Administered 2018-08-27: 1 via OPHTHALMIC

## 2018-08-27 MED ORDER — LIDOCAINE HCL (PF) 1 % IJ SOLN
INTRAOCULAR | Status: DC | PRN
  Administered 2018-08-27: 11:00:00 1 mL via OPHTHALMIC

## 2018-08-27 MED ORDER — CYCLOPENTOLATE-PHENYLEPHRINE 0.2-1 % OP SOLN
1.0000 [drp] | OPHTHALMIC | Status: AC
  Administered 2018-08-27 (×3): 1 [drp] via OPHTHALMIC

## 2018-08-27 MED ORDER — TETRACAINE HCL 0.5 % OP SOLN
1.0000 [drp] | OPHTHALMIC | Status: AC
  Administered 2018-08-27 (×3): 1 [drp] via OPHTHALMIC

## 2018-08-27 MED ORDER — SODIUM HYALURONATE 23 MG/ML IO SOLN
INTRAOCULAR | Status: DC | PRN
  Administered 2018-08-27: 0.6 mL via INTRAOCULAR

## 2018-08-27 SURGICAL SUPPLY — 15 items
CLOTH BEACON ORANGE TIMEOUT ST (SAFETY) ×1 IMPLANT
EYE SHIELD UNIVERSAL CLEAR (GAUZE/BANDAGES/DRESSINGS) ×1 IMPLANT
GLOVE BIOGEL PI IND STRL 6.5 (GLOVE) IMPLANT
GLOVE BIOGEL PI IND STRL 7.0 (GLOVE) IMPLANT
GLOVE BIOGEL PI INDICATOR 6.5 (GLOVE) ×1
GLOVE BIOGEL PI INDICATOR 7.0 (GLOVE) ×1
LENS ALC ACRYL/TECN (Ophthalmic Related) ×1 IMPLANT
NDL HYPO 18GX1.5 BLUNT FILL (NEEDLE) IMPLANT
NEEDLE HYPO 18GX1.5 BLUNT FILL (NEEDLE) ×2 IMPLANT
PAD ARMBOARD 7.5X6 YLW CONV (MISCELLANEOUS) ×1 IMPLANT
SYR TB 1ML LL NO SAFETY (SYRINGE) ×1 IMPLANT
TAPE SURG TRANSPORE 1 IN (GAUZE/BANDAGES/DRESSINGS) IMPLANT
TAPE SURGICAL TRANSPORE 1 IN (GAUZE/BANDAGES/DRESSINGS) ×1
VISCOELASTIC ADDITIONAL (OPHTHALMIC RELATED) ×1 IMPLANT
WATER STERILE IRR 250ML POUR (IV SOLUTION) ×1 IMPLANT

## 2018-08-27 NOTE — Op Note (Signed)
Date of procedure: 08/27/18  Pre-operative diagnosis: Visually significant age-related cataract, Left Eye (H25.12)  Post-operative diagnosis: Visually significant age-related cataract, Left Eye  Procedure: Removal of cataract via phacoemulsification and insertion of intra-ocular lens [employer] PCB00  +22.5D into the capsular bag of the Left Eye  Attending surgeon: Gerda Diss. Jaicey Sweaney, MD, MA  Anesthesia: MAC, Topical Akten  Complications: None  Estimated Blood Loss: <87m (minimal)  Specimens: None  Implants: As above  Indications:  Visually significant age-related cataract, Left Eye  Procedure:  The patient was seen and identified in the pre-operative area. The operative eye was identified and dilated.  The operative eye was marked.  Topical anesthesia was administered to the operative eye.     The patient was then to the operative suite and placed in the supine position.  A timeout was performed confirming the patient, procedure to be performed, and all other relevant information.   The patient's face was prepped and draped in the usual fashion for intra-ocular surgery.  A lid speculum was placed into the operative eye and the surgical microscope moved into place and focused.  An inferotemporal paracentesis was created using a 20 gauge paracentesis blade.  Shugarcaine was injected into the anterior chamber.  Viscoelastic was injected into the anterior chamber.  A temporal clear-corneal main wound incision was created using a 2.453mmicrokeratome.  A continuous curvilinear capsulorrhexis was initiated using an irrigating cystitome and completed using capsulorrhexis forceps.  Hydrodissection and hydrodeliniation were performed.  Viscoelastic was injected into the anterior chamber.  A phacoemulsification handpiece and a chopper as a second instrument were used to remove the nucleus and epinucleus. The irrigation/aspiration handpiece was used to remove any remaining cortical  material.   The capsular bag was reinflated with viscoelastic, checked, and found to be intact.  The intraocular lens was inserted into the capsular bag and dialed into place using a Kuglen hook.  The irrigation/aspiration handpiece was used to remove any remaining viscoelastic.  The clear corneal wound and paracentesis wounds were then hydrated and checked with Weck-Cels to be watertight.  The lid-speculum and drape was removed, and the patient's face was cleaned with a wet and dry 4x4.  Maxitrol was instilled in the eye before a clear shield was taped over the eye. The patient was taken to the post-operative care unit in good condition, having tolerated the procedure well.  Post-Op Instructions: [facility] and will receive all other orders and instructions.

## 2018-08-27 NOTE — H&P (Signed)
The H and P was reviewed and updated. The patient was examined.  No changes were found after exam.  The surgical eye was marked.  

## 2018-08-27 NOTE — Transfer of Care (Signed)
Immediate Anesthesia Transfer of Care Note  Patient: Stacy Morris  Procedure(s) Performed: CATARACT EXTRACTION PHACO AND INTRAOCULAR LENS PLACEMENT (IOC) (Left Eye)  Patient Location: PACU  Anesthesia Type:MAC  Level of Consciousness: awake and patient cooperative  Airway & Oxygen Therapy: Patient Spontanous Breathing  Post-op Assessment: Report given to RN and Post -op Vital signs reviewed and stable  Post vital signs: Reviewed and stable  Last Vitals:  Vitals Value Taken Time  BP    Temp    Pulse    Resp    SpO2      Last Pain:  Vitals:   08/27/18 0918  TempSrc: Oral  PainSc:          Complications: No apparent anesthesia complications

## 2018-08-27 NOTE — Anesthesia Postprocedure Evaluation (Addendum)
Anesthesia Post Note Late entry for 1112 Patient: Stacy Morris  Procedure(s) Performed: CATARACT EXTRACTION PHACO AND INTRAOCULAR LENS PLACEMENT (IOC) (Left Eye)  Patient location during evaluation: Short Stay Anesthesia Type: MAC Level of consciousness: awake and alert     Last Vitals:  Vitals:   08/27/18 0918 08/27/18 1111  BP:  (!) 146/79  Pulse:  83  Resp:  18  Temp: 36.8 C 36.7 C  SpO2:  96%    Last Pain:  Vitals:   08/27/18 1111  TempSrc:   PainSc: 0-No pain                 ADAMS, AMY A

## 2018-08-27 NOTE — Discharge Instructions (Signed)
The H and P was reviewed and updated. The patient was examined.  No changes were found after exam.  The surgical eye was marked.  

## 2018-08-27 NOTE — Anesthesia Preprocedure Evaluation (Addendum)
Anesthesia Evaluation  Patient identified by MRN, date of birth, ID band Patient awake    Reviewed: Allergy & Precautions, H&P , NPO status , Patient's Chart, lab work & pertinent test results  History of Anesthesia Complications (+) PONV and history of anesthetic complications  Airway        Dental   Pulmonary           Cardiovascular hypertension,      Neuro/Psych PSYCHIATRIC DISORDERS Depression    GI/Hepatic GERD  ,  Endo/Other  Hypothyroidism   Renal/GU CRFRenal disease     Musculoskeletal   Abdominal   Peds  Hematology  (+) Blood dyscrasia, anemia ,   Anesthesia Other Findings   Reproductive/Obstetrics                             Anesthesia Physical Anesthesia Plan  ASA: III  Anesthesia Plan: MAC   Post-op Pain Management:    Induction:   PONV Risk Score and Plan: 3 and TIVA  Airway Management Planned:   Additional Equipment:   Intra-op Plan:   Post-operative Plan:   Informed Consent: I have reviewed the patients History and Physical, chart, labs and discussed the procedure including the risks, benefits and alternatives for the proposed anesthesia with the patient or authorized representative who has indicated his/her understanding and acceptance.       Plan Discussed with: CRNA  Anesthesia Plan Comments:       Anesthesia Quick Evaluation

## 2018-08-29 ENCOUNTER — Encounter (HOSPITAL_COMMUNITY): Payer: Self-pay | Admitting: Ophthalmology

## 2018-08-29 NOTE — Addendum Note (Signed)
Addendum  created 08/29/18 0745 by Ollen Bowl, CRNA   Charge Capture section accepted, Clinical Note Signed, SmartForm saved

## 2018-11-22 ENCOUNTER — Other Ambulatory Visit: Payer: Self-pay

## 2018-11-22 ENCOUNTER — Emergency Department (HOSPITAL_COMMUNITY): Payer: Medicare Other

## 2018-11-22 ENCOUNTER — Inpatient Hospital Stay (HOSPITAL_COMMUNITY)
Admission: EM | Admit: 2018-11-22 | Discharge: 2018-11-26 | DRG: 291 | Disposition: A | Discharge status: Home Health | Payer: Medicare Other | Attending: Internal Medicine | Admitting: Internal Medicine

## 2018-11-22 ENCOUNTER — Encounter (HOSPITAL_COMMUNITY): Payer: Self-pay

## 2018-11-22 DIAGNOSIS — M159 Polyosteoarthritis, unspecified: Secondary | ICD-10-CM | POA: Diagnosis present

## 2018-11-22 DIAGNOSIS — Z85828 Personal history of other malignant neoplasm of skin: Secondary | ICD-10-CM

## 2018-11-22 DIAGNOSIS — N289 Disorder of kidney and ureter, unspecified: Secondary | ICD-10-CM

## 2018-11-22 DIAGNOSIS — M109 Gout, unspecified: Secondary | ICD-10-CM | POA: Diagnosis present

## 2018-11-22 DIAGNOSIS — Z96653 Presence of artificial knee joint, bilateral: Secondary | ICD-10-CM | POA: Diagnosis present

## 2018-11-22 DIAGNOSIS — Z7982 Long term (current) use of aspirin: Secondary | ICD-10-CM

## 2018-11-22 DIAGNOSIS — Z885 Allergy status to narcotic agent status: Secondary | ICD-10-CM

## 2018-11-22 DIAGNOSIS — F329 Major depressive disorder, single episode, unspecified: Secondary | ICD-10-CM | POA: Diagnosis present

## 2018-11-22 DIAGNOSIS — K219 Gastro-esophageal reflux disease without esophagitis: Secondary | ICD-10-CM | POA: Diagnosis present

## 2018-11-22 DIAGNOSIS — M542 Cervicalgia: Secondary | ICD-10-CM | POA: Diagnosis present

## 2018-11-22 DIAGNOSIS — I509 Heart failure, unspecified: Secondary | ICD-10-CM

## 2018-11-22 DIAGNOSIS — N184 Chronic kidney disease, stage 4 (severe): Secondary | ICD-10-CM | POA: Diagnosis present

## 2018-11-22 DIAGNOSIS — Z8249 Family history of ischemic heart disease and other diseases of the circulatory system: Secondary | ICD-10-CM

## 2018-11-22 DIAGNOSIS — E039 Hypothyroidism, unspecified: Secondary | ICD-10-CM | POA: Diagnosis present

## 2018-11-22 DIAGNOSIS — I130 Hypertensive heart and chronic kidney disease with heart failure and stage 1 through stage 4 chronic kidney disease, or unspecified chronic kidney disease: Secondary | ICD-10-CM | POA: Diagnosis not present

## 2018-11-22 DIAGNOSIS — J189 Pneumonia, unspecified organism: Secondary | ICD-10-CM | POA: Diagnosis present

## 2018-11-22 DIAGNOSIS — I5031 Acute diastolic (congestive) heart failure: Secondary | ICD-10-CM | POA: Diagnosis present

## 2018-11-22 DIAGNOSIS — F419 Anxiety disorder, unspecified: Secondary | ICD-10-CM | POA: Diagnosis present

## 2018-11-22 DIAGNOSIS — Z7952 Long term (current) use of systemic steroids: Secondary | ICD-10-CM

## 2018-11-22 DIAGNOSIS — I10 Essential (primary) hypertension: Secondary | ICD-10-CM | POA: Diagnosis present

## 2018-11-22 DIAGNOSIS — H9190 Unspecified hearing loss, unspecified ear: Secondary | ICD-10-CM | POA: Diagnosis present

## 2018-11-22 DIAGNOSIS — M069 Rheumatoid arthritis, unspecified: Secondary | ICD-10-CM | POA: Diagnosis present

## 2018-11-22 DIAGNOSIS — Z20828 Contact with and (suspected) exposure to other viral communicable diseases: Secondary | ICD-10-CM | POA: Diagnosis present

## 2018-11-22 DIAGNOSIS — I160 Hypertensive urgency: Secondary | ICD-10-CM | POA: Diagnosis present

## 2018-11-22 DIAGNOSIS — R0602 Shortness of breath: Secondary | ICD-10-CM | POA: Diagnosis not present

## 2018-11-22 DIAGNOSIS — J9601 Acute respiratory failure with hypoxia: Secondary | ICD-10-CM | POA: Diagnosis present

## 2018-11-22 LAB — CBC WITH DIFFERENTIAL/PLATELET
Abs Immature Granulocytes: 0.09 10*3/uL — ABNORMAL HIGH (ref 0.00–0.07)
Basophils Absolute: 0 10*3/uL (ref 0.0–0.1)
Basophils Relative: 0 %
Eosinophils Absolute: 0.1 10*3/uL (ref 0.0–0.5)
Eosinophils Relative: 1 %
HCT: 38.7 % (ref 36.0–46.0)
Hemoglobin: 12.1 g/dL (ref 12.0–15.0)
Immature Granulocytes: 1 %
Lymphocytes Relative: 16 %
Lymphs Abs: 1.7 10*3/uL (ref 0.7–4.0)
MCH: 33 pg (ref 26.0–34.0)
MCHC: 31.3 g/dL (ref 30.0–36.0)
MCV: 105.4 fL — ABNORMAL HIGH (ref 80.0–100.0)
Monocytes Absolute: 0.5 10*3/uL (ref 0.1–1.0)
Monocytes Relative: 5 %
Neutro Abs: 8 10*3/uL — ABNORMAL HIGH (ref 1.7–7.7)
Neutrophils Relative %: 77 %
Platelets: 220 10*3/uL (ref 150–400)
RBC: 3.67 MIL/uL — ABNORMAL LOW (ref 3.87–5.11)
RDW: 14.4 % (ref 11.5–15.5)
WBC: 10.3 10*3/uL (ref 4.0–10.5)
nRBC: 0 % (ref 0.0–0.2)

## 2018-11-22 LAB — COMPREHENSIVE METABOLIC PANEL
ALT: 23 U/L (ref 0–44)
AST: 21 U/L (ref 15–41)
Albumin: 3.6 g/dL (ref 3.5–5.0)
Alkaline Phosphatase: 97 U/L (ref 38–126)
Anion gap: 10 (ref 5–15)
BUN: 43 mg/dL — ABNORMAL HIGH (ref 8–23)
CO2: 24 mmol/L (ref 22–32)
Calcium: 10.2 mg/dL (ref 8.9–10.3)
Chloride: 107 mmol/L (ref 98–111)
Creatinine, Ser: 2.63 mg/dL — ABNORMAL HIGH (ref 0.44–1.00)
GFR calc Af Amer: 19 mL/min — ABNORMAL LOW (ref >60)
GFR calc non Af Amer: 16 mL/min — ABNORMAL LOW (ref >60)
Glucose, Bld: 144 mg/dL — ABNORMAL HIGH (ref 70–99)
Potassium: 4.8 mmol/L (ref 3.5–5.1)
Sodium: 141 mmol/L (ref 135–145)
Total Bilirubin: 0.9 mg/dL (ref 0.3–1.2)
Total Protein: 7 g/dL (ref 6.5–8.1)

## 2018-11-22 NOTE — ED Triage Notes (Signed)
Pt arrives POV for eval of worsening SOB, pt was diagnosed w/ PNA on Monday but reports ongoing SOB and progressive malaise. Pt reports she also has neck pain x 1 month.

## 2018-11-23 ENCOUNTER — Observation Stay (HOSPITAL_BASED_OUTPATIENT_CLINIC_OR_DEPARTMENT_OTHER): Payer: Medicare Other

## 2018-11-23 ENCOUNTER — Observation Stay (HOSPITAL_COMMUNITY): Payer: Medicare Other

## 2018-11-23 ENCOUNTER — Encounter (HOSPITAL_COMMUNITY): Payer: Self-pay | Admitting: Internal Medicine

## 2018-11-23 ENCOUNTER — Other Ambulatory Visit: Payer: Self-pay

## 2018-11-23 DIAGNOSIS — J189 Pneumonia, unspecified organism: Secondary | ICD-10-CM | POA: Diagnosis present

## 2018-11-23 DIAGNOSIS — I371 Nonrheumatic pulmonary valve insufficiency: Secondary | ICD-10-CM

## 2018-11-23 DIAGNOSIS — I160 Hypertensive urgency: Secondary | ICD-10-CM | POA: Diagnosis not present

## 2018-11-23 DIAGNOSIS — N184 Chronic kidney disease, stage 4 (severe): Secondary | ICD-10-CM

## 2018-11-23 DIAGNOSIS — J9601 Acute respiratory failure with hypoxia: Secondary | ICD-10-CM

## 2018-11-23 HISTORY — DX: Acute respiratory failure with hypoxia: J96.01

## 2018-11-23 LAB — COMPREHENSIVE METABOLIC PANEL
ALT: 23 U/L (ref 0–44)
AST: 24 U/L (ref 15–41)
Albumin: 3.5 g/dL (ref 3.5–5.0)
Alkaline Phosphatase: 97 U/L (ref 38–126)
Anion gap: 13 (ref 5–15)
BUN: 42 mg/dL — ABNORMAL HIGH (ref 8–23)
CO2: 21 mmol/L — ABNORMAL LOW (ref 22–32)
Calcium: 10.2 mg/dL (ref 8.9–10.3)
Chloride: 107 mmol/L (ref 98–111)
Creatinine, Ser: 2.66 mg/dL — ABNORMAL HIGH (ref 0.44–1.00)
GFR calc Af Amer: 18 mL/min — ABNORMAL LOW (ref >60)
GFR calc non Af Amer: 16 mL/min — ABNORMAL LOW (ref >60)
Glucose, Bld: 120 mg/dL — ABNORMAL HIGH (ref 70–99)
Potassium: 4.5 mmol/L (ref 3.5–5.1)
Sodium: 141 mmol/L (ref 135–145)
Total Bilirubin: 1.3 mg/dL — ABNORMAL HIGH (ref 0.3–1.2)
Total Protein: 7.1 g/dL (ref 6.5–8.1)

## 2018-11-23 LAB — TROPONIN I (HIGH SENSITIVITY)
Troponin I (High Sensitivity): 17 ng/L (ref <18)
Troponin I (High Sensitivity): 18 ng/L — ABNORMAL HIGH (ref <18)
Troponin I (High Sensitivity): 18 ng/L — ABNORMAL HIGH (ref <18)
Troponin I (High Sensitivity): 19 ng/L — ABNORMAL HIGH (ref <18)

## 2018-11-23 LAB — D-DIMER, QUANTITATIVE: D-Dimer, Quant: 5.42 ug/mL-FEU — ABNORMAL HIGH (ref 0.00–0.50)

## 2018-11-23 LAB — CBC WITH DIFFERENTIAL/PLATELET
Abs Immature Granulocytes: 0.09 10*3/uL — ABNORMAL HIGH (ref 0.00–0.07)
Basophils Absolute: 0 10*3/uL (ref 0.0–0.1)
Basophils Relative: 0 %
Eosinophils Absolute: 0.1 10*3/uL (ref 0.0–0.5)
Eosinophils Relative: 1 %
HCT: 40.3 % (ref 36.0–46.0)
Hemoglobin: 12.6 g/dL (ref 12.0–15.0)
Immature Granulocytes: 1 %
Lymphocytes Relative: 22 %
Lymphs Abs: 2.5 10*3/uL (ref 0.7–4.0)
MCH: 32.6 pg (ref 26.0–34.0)
MCHC: 31.3 g/dL (ref 30.0–36.0)
MCV: 104.4 fL — ABNORMAL HIGH (ref 80.0–100.0)
Monocytes Absolute: 1.2 10*3/uL — ABNORMAL HIGH (ref 0.1–1.0)
Monocytes Relative: 10 %
Neutro Abs: 7.7 10*3/uL (ref 1.7–7.7)
Neutrophils Relative %: 66 %
Platelets: 225 10*3/uL (ref 150–400)
RBC: 3.86 MIL/uL — ABNORMAL LOW (ref 3.87–5.11)
RDW: 14.6 % (ref 11.5–15.5)
WBC: 11.7 10*3/uL — ABNORMAL HIGH (ref 4.0–10.5)
nRBC: 0 % (ref 0.0–0.2)

## 2018-11-23 LAB — ECHOCARDIOGRAM COMPLETE
Height: 67 in
Weight: 3200 oz

## 2018-11-23 LAB — BRAIN NATRIURETIC PEPTIDE: B Natriuretic Peptide: 127.4 pg/mL — ABNORMAL HIGH (ref 0.0–100.0)

## 2018-11-23 LAB — SARS CORONAVIRUS 2 (TAT 6-24 HRS): SARS Coronavirus 2: NEGATIVE

## 2018-11-23 LAB — MAGNESIUM: Magnesium: 2.3 mg/dL (ref 1.7–2.4)

## 2018-11-23 LAB — TSH: TSH: 1.66 u[IU]/mL (ref 0.350–4.500)

## 2018-11-23 MED ORDER — ALLOPURINOL 100 MG PO TABS
100.0000 mg | ORAL_TABLET | Freq: Every day | ORAL | Status: DC
  Administered 2018-11-23 – 2018-11-26 (×4): 100 mg via ORAL
  Filled 2018-11-23 (×4): qty 1

## 2018-11-23 MED ORDER — PREDNISONE 5 MG PO TABS
5.0000 mg | ORAL_TABLET | Freq: Every day | ORAL | Status: DC
  Administered 2018-11-23 – 2018-11-26 (×4): 5 mg via ORAL
  Filled 2018-11-23 (×4): qty 1

## 2018-11-23 MED ORDER — LEVOTHYROXINE SODIUM 50 MCG PO TABS
50.0000 ug | ORAL_TABLET | Freq: Every day | ORAL | Status: DC
  Administered 2018-11-23 – 2018-11-26 (×4): 50 ug via ORAL
  Filled 2018-11-23 (×4): qty 1

## 2018-11-23 MED ORDER — ACETAMINOPHEN 650 MG RE SUPP: 650.0000 mg | Freq: Four times a day (QID) | RECTAL | Status: DC | PRN

## 2018-11-23 MED ORDER — ONDANSETRON HCL 4 MG PO TABS: 4.0000 mg | ORAL_TABLET | Freq: Four times a day (QID) | ORAL | Status: DC | PRN

## 2018-11-23 MED ORDER — CITALOPRAM HYDROBROMIDE 40 MG PO TABS
40.0000 mg | ORAL_TABLET | Freq: Every day | ORAL | Status: DC
  Administered 2018-11-23 – 2018-11-24 (×2): 40 mg via ORAL
  Filled 2018-11-23: qty 1
  Filled 2018-11-23: qty 4

## 2018-11-23 MED ORDER — PANTOPRAZOLE SODIUM 40 MG PO TBEC
40.0000 mg | DELAYED_RELEASE_TABLET | Freq: Every day | ORAL | Status: DC
  Administered 2018-11-23 – 2018-11-26 (×4): 40 mg via ORAL
  Filled 2018-11-23 (×4): qty 1

## 2018-11-23 MED ORDER — POLYVINYL ALCOHOL 1.4 % OP SOLN: 1.0000 [drp] | Freq: Three times a day (TID) | OPHTHALMIC | Status: DC | PRN

## 2018-11-23 MED ORDER — ACETAMINOPHEN 325 MG PO TABS
650.0000 mg | ORAL_TABLET | Freq: Four times a day (QID) | ORAL | Status: DC | PRN
  Administered 2018-11-24 – 2018-11-26 (×4): 650 mg via ORAL
  Filled 2018-11-23 (×4): qty 2

## 2018-11-23 MED ORDER — AMLODIPINE BESYLATE 5 MG PO TABS
5.0000 mg | ORAL_TABLET | Freq: Every day | ORAL | Status: DC
  Administered 2018-11-23 – 2018-11-25 (×3): 5 mg via ORAL
  Filled 2018-11-23 (×3): qty 1

## 2018-11-23 MED ORDER — HYDRALAZINE HCL 20 MG/ML IJ SOLN: 10.0000 mg | INTRAMUSCULAR | Status: DC | PRN

## 2018-11-23 MED ORDER — FUROSEMIDE 10 MG/ML IJ SOLN
20.0000 mg | Freq: Once | INTRAMUSCULAR | Status: AC
  Administered 2018-11-23: 20 mg via INTRAVENOUS
  Filled 2018-11-23: qty 2

## 2018-11-23 MED ORDER — FUROSEMIDE 10 MG/ML IJ SOLN
40.0000 mg | Freq: Every day | INTRAMUSCULAR | Status: DC
  Administered 2018-11-23 – 2018-11-24 (×2): 40 mg via INTRAVENOUS
  Filled 2018-11-23 (×3): qty 4

## 2018-11-23 MED ORDER — ATENOLOL 50 MG PO TABS
25.0000 mg | ORAL_TABLET | Freq: Every day | ORAL | Status: DC
  Administered 2018-11-23 – 2018-11-26 (×4): 25 mg via ORAL
  Filled 2018-11-23 (×4): qty 1

## 2018-11-23 MED ORDER — HEPARIN SODIUM (PORCINE) 5000 UNIT/ML IJ SOLN
5000.0000 [IU] | Freq: Three times a day (TID) | INTRAMUSCULAR | Status: DC
  Administered 2018-11-23 – 2018-11-26 (×9): 5000 [IU] via SUBCUTANEOUS
  Filled 2018-11-23 (×9): qty 1

## 2018-11-23 MED ORDER — CALCIUM CARBONATE-VITAMIN D 500-200 MG-UNIT PO TABS
1.0000 | ORAL_TABLET | Freq: Every day | ORAL | Status: DC
  Administered 2018-11-23 – 2018-11-25 (×3): 1 via ORAL
  Filled 2018-11-23 (×4): qty 1

## 2018-11-23 MED ORDER — FERROUS SULFATE 325 (65 FE) MG PO TABS
325.0000 mg | ORAL_TABLET | Freq: Every day | ORAL | Status: DC
  Administered 2018-11-23 – 2018-11-26 (×4): 325 mg via ORAL
  Filled 2018-11-23 (×4): qty 1

## 2018-11-23 MED ORDER — ALPRAZOLAM 0.5 MG PO TABS: 0.5000 mg | ORAL_TABLET | Freq: Three times a day (TID) | ORAL | Status: DC | PRN

## 2018-11-23 MED ORDER — ONDANSETRON HCL 4 MG/2ML IJ SOLN: 4.0000 mg | Freq: Four times a day (QID) | INTRAMUSCULAR | Status: DC | PRN

## 2018-11-23 MED ORDER — ASPIRIN EC 81 MG PO TBEC
81.0000 mg | DELAYED_RELEASE_TABLET | Freq: Every day | ORAL | Status: DC
  Administered 2018-11-23 – 2018-11-26 (×4): 81 mg via ORAL
  Filled 2018-11-23 (×4): qty 1
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
## 2018-11-23 NOTE — ED Notes (Signed)
Patient placed in hospital bed warm blankets given
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
## 2018-11-23 NOTE — Progress Notes (Signed)
  Echocardiogram 2D Echocardiogram has been performed.  Stacy Morris G Stacy Morris 11/23/2018, 3:37 PM

## 2018-11-23 NOTE — ED Notes (Signed)
Pt to nuclear medicine with transporter. VSS. Denies pain at present.

## 2018-11-23 NOTE — ED Notes (Signed)
Tele   Recalled to order hospital bed

## 2018-11-23 NOTE — Progress Notes (Addendum)
Patient placed in observation after midnight, but care began prior to midnight in the ER  85 year old admitted acute respiratory failure related to acute chf likely diastolic related to uncontrolled BP. Pt with gradual onset of orthopnea, sob DOE, dry cough. PCP prescribed antibiotics 3 days ago for possible pneumonia. Presented with elevated BP and chest xray shows right sided pleural effuission, D-Dimer elevated. Given lasix 40mg  IV in ED.   A/P 1. Acute respiratory failure with hypoxia likely from CHF. oxygen saturation level dropped into 80's with ambulation on room air. D-dimer elevated so awaiting VQ scan.  awaiting 2D echo. follow intake output metabolic panel. Continue IV Lasix 40 IV daily for now.  Note that patient has chronic kidney disease. 2. Hypertensive urgency likely contributing to patient's symptoms. Patient reports BP usually controlled. Home meds include norvasc, atenolol. Resumed home meds. Monitor.   On PRN IV hydralazine.  . 3. Rheumatoid arthritis on Enbrel and prednisone. 4. Hypothyroidism on Synthroid. 5. History of gout on allopurinol. 6. Chronic kidney disease stage IV creatinine appears to be at baseline.  Closely monitor basic metabolic panel since patient is on IV Lasix. 7. CAP. Not likely. Xray as noted above. Will continue antibiotics for now but plan to d/c after echo and V/Q scan done    Santiago Glad m. Black, rn    She is still unable to lay flat and very dyspneic with exertion.  I/Os are not accurate as purewick has been leaking.  V/Q scan pending.  Echo pending.  Not ready for discharge yet -follow with Dr. Marval Regal at [facility]

## 2018-11-23 NOTE — ED Notes (Signed)
Ambulated patient in hallway on pulse ox. sats 89-90% after returning to bed was more sob, walked approx. 50 ft.
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
## 2018-11-24 DIAGNOSIS — Z8249 Family history of ischemic heart disease and other diseases of the circulatory system: Secondary | ICD-10-CM | POA: Diagnosis not present

## 2018-11-24 DIAGNOSIS — H9190 Unspecified hearing loss, unspecified ear: Secondary | ICD-10-CM | POA: Diagnosis present

## 2018-11-24 DIAGNOSIS — M069 Rheumatoid arthritis, unspecified: Secondary | ICD-10-CM | POA: Diagnosis present

## 2018-11-24 DIAGNOSIS — I5031 Acute diastolic (congestive) heart failure: Secondary | ICD-10-CM | POA: Diagnosis present

## 2018-11-24 DIAGNOSIS — Z885 Allergy status to narcotic agent status: Secondary | ICD-10-CM | POA: Diagnosis not present

## 2018-11-24 DIAGNOSIS — Z7982 Long term (current) use of aspirin: Secondary | ICD-10-CM | POA: Diagnosis not present

## 2018-11-24 DIAGNOSIS — J9601 Acute respiratory failure with hypoxia: Secondary | ICD-10-CM | POA: Diagnosis present

## 2018-11-24 DIAGNOSIS — Z7952 Long term (current) use of systemic steroids: Secondary | ICD-10-CM | POA: Diagnosis not present

## 2018-11-24 DIAGNOSIS — I160 Hypertensive urgency: Secondary | ICD-10-CM | POA: Diagnosis present

## 2018-11-24 DIAGNOSIS — F329 Major depressive disorder, single episode, unspecified: Secondary | ICD-10-CM | POA: Diagnosis present

## 2018-11-24 DIAGNOSIS — M542 Cervicalgia: Secondary | ICD-10-CM | POA: Diagnosis present

## 2018-11-24 DIAGNOSIS — E039 Hypothyroidism, unspecified: Secondary | ICD-10-CM | POA: Diagnosis present

## 2018-11-24 DIAGNOSIS — I130 Hypertensive heart and chronic kidney disease with heart failure and stage 1 through stage 4 chronic kidney disease, or unspecified chronic kidney disease: Secondary | ICD-10-CM | POA: Diagnosis present

## 2018-11-24 DIAGNOSIS — F419 Anxiety disorder, unspecified: Secondary | ICD-10-CM | POA: Diagnosis present

## 2018-11-24 DIAGNOSIS — R0602 Shortness of breath: Secondary | ICD-10-CM | POA: Diagnosis present

## 2018-11-24 DIAGNOSIS — J189 Pneumonia, unspecified organism: Secondary | ICD-10-CM | POA: Diagnosis present

## 2018-11-24 DIAGNOSIS — K219 Gastro-esophageal reflux disease without esophagitis: Secondary | ICD-10-CM | POA: Diagnosis present

## 2018-11-24 DIAGNOSIS — Z85828 Personal history of other malignant neoplasm of skin: Secondary | ICD-10-CM | POA: Diagnosis not present

## 2018-11-24 DIAGNOSIS — M109 Gout, unspecified: Secondary | ICD-10-CM | POA: Diagnosis present

## 2018-11-24 DIAGNOSIS — Z20828 Contact with and (suspected) exposure to other viral communicable diseases: Secondary | ICD-10-CM | POA: Diagnosis present

## 2018-11-24 DIAGNOSIS — M159 Polyosteoarthritis, unspecified: Secondary | ICD-10-CM | POA: Diagnosis present

## 2018-11-24 DIAGNOSIS — N184 Chronic kidney disease, stage 4 (severe): Secondary | ICD-10-CM | POA: Diagnosis present

## 2018-11-24 DIAGNOSIS — Z96653 Presence of artificial knee joint, bilateral: Secondary | ICD-10-CM | POA: Diagnosis present

## 2018-11-24 LAB — BASIC METABOLIC PANEL
Anion gap: 12 (ref 5–15)
BUN: 50 mg/dL — ABNORMAL HIGH (ref 8–23)
CO2: 24 mmol/L (ref 22–32)
Calcium: 10.3 mg/dL (ref 8.9–10.3)
Chloride: 105 mmol/L (ref 98–111)
Creatinine, Ser: 2.93 mg/dL — ABNORMAL HIGH (ref 0.44–1.00)
GFR calc Af Amer: 16 mL/min — ABNORMAL LOW (ref >60)
GFR calc non Af Amer: 14 mL/min — ABNORMAL LOW (ref >60)
Glucose, Bld: 97 mg/dL (ref 70–99)
Potassium: 3.9 mmol/L (ref 3.5–5.1)
Sodium: 141 mmol/L (ref 135–145)

## 2018-11-24 MED ORDER — DICLOFENAC SODIUM 1 % TD GEL
2.0000 g | Freq: Four times a day (QID) | TRANSDERMAL | Status: DC
  Administered 2018-11-24 – 2018-11-26 (×7): 2 g via TOPICAL
  Filled 2018-11-24: qty 100

## 2018-11-24 MED ORDER — CITALOPRAM HYDROBROMIDE 20 MG PO TABS
20.0000 mg | ORAL_TABLET | Freq: Every day | ORAL | Status: DC
  Administered 2018-11-25 – 2018-11-26 (×2): 20 mg via ORAL
  Filled 2018-11-24 (×2): qty 1

## 2018-11-24 NOTE — Care Management Obs Status (Signed)
[facility] NOTIFICATION   Patient Details  Name: KARREN NEWLAND MRN: [id number] Date of Birth: [birth year]   Medicare Observation Status Notification Given:  Yes    Bartholomew Crews, RN 11/24/2018, 9:49 AM

## 2018-11-24 NOTE — Plan of Care (Signed)
°  Problem: Clinical Measurements: °Goal: Cardiovascular complication will be avoided °Outcome: Progressing °  °Problem: Activity: °Goal: Risk for activity intolerance will decrease °Outcome: Progressing °  °

## 2018-11-24 NOTE — Progress Notes (Signed)
SATURATION QUALIFICATIONS: (This note is used to comply with regulatory documentation for home oxygen)  Patient Saturations on Room Air at Rest = 95%  Patient Saturations on Room Air while Ambulating = 89%  Patient Saturations on 2 Liters of oxygen while Ambulating = 95%  Please briefly explain why patient needs home oxygen  Patient ambulated well with O2 at 2 L and saturating from 94-96% . Patient denies any shortness of breath while ambulating, feels much better from previous days.  The only thing significant is when she ambulated without oxygen , her O2 sat dropped significantly to 89-90 % but denies any shortness of breath.

## 2018-11-24 NOTE — Plan of Care (Signed)
  Problem: Education: Goal: Knowledge of General Education information will improve Description: Including pain rating scale, medication(s)/side effects and non-pharmacologic comfort measures Outcome: Progressing   Problem: Activity: Goal: Risk for activity intolerance will decrease Outcome: Progressing   

## 2018-11-24 NOTE — Progress Notes (Signed)
Progress Note    Stacy Morris  VOZ:[id number] DOB: 1933/11/24  DOA: 11/22/2018 PCP: Heywood Bene, PA-C    Brief Narrative:     Medical records reviewed and are as summarized below:  Stacy Morris is an 85 y.o. female with history of rheumatoid arthritis on prednisone and Enbrel, hypertension, hypothyroidism and chronic kidney disease stage IV has been experiencing shortness of breath with orthopnea and paroxysmal nocturnal dyspnea over the last 3 to 4 weeks with some vague discomfort of the right upper extremity radiating to the leg.  Had gone to her PCP 3 days ago and was placed on antibiotics for possible pneumonia.  Patient denies any productive cough fever chills.  Denies any nausea vomiting or diarrhea.  Assessment/Plan:   Principal Problem:   Acute respiratory failure with hypoxia (HCC) Active Problems:   Rheumatoid arthritis (HCC)   Hypothyroidism   Chronic kidney disease (CKD), stage IV (severe) (HCC)   HTN (hypertension)   Hypertensive urgency   CAP (community acquired pneumonia)   Acute respiratory failure with hypoxia likely from CHF -oxygen saturation level dropped into 80's with ambulation on room air -D-dimer elevated but VQ scan low probability - 2D echo: Left ventricular diastolic Doppler parameters are consistent with impaired relaxation pattern of LV diastolic filling. -strict intake output: canister full of urine when I was in the room so not sure I/Os are accurate  -needs Continued IV Lasix 40.  Hypertensive urgency - likely contributing to patient's symptoms.  -Patient reports BP usually controlled.  -Home meds include norvasc, atenolol.  -Resumed home meds  Rheumatoid arthritis  - Enbrel and prednisone.  Hypothyroidism  -Synthroid.  History of gout - on allopurinol.  Neck pain -trial of voltaren gel  Chronic kidney disease stage IV creatinine appears to be at baseline.  -Closely monitor basic metabolic closely since patient  is on IV Lasix. -follow with Dr. Marval Regal at [facility]  CAP. Not likely. Xray as noted above.  -if WBC count normal in AM will d/c   Family Communication/Anticipated D/C date and plan/Code Status   DVT prophylaxis: heparin Code Status: Full Code.  Family Communication: son at bedside Disposition Plan: still showing signs of volume overload so needs continued diuresis with IV medications-- will change to inpatient   Medical Consultants:    None.     Subjective:   Still very short of breath when moving and not able to lay flat  Objective:    Vitals:   11/23/18 1537 11/23/18 2106 11/24/18 0401 11/24/18 0822  BP: (!) 156/72 (!) 161/76 (!) 148/71 (!) 153/71  Pulse: 67 64 68 68  Resp: 18 20 18 18   Temp: 98.4 F (36.9 C) 98.5 F (36.9 C) 98.5 F (36.9 C) 97.9 F (36.6 C)  TempSrc: Oral Oral Oral Oral  SpO2: 97% 94% 94% 94%  Weight:   83.6 kg   Height:        Intake/Output Summary (Last 24 hours) at 11/24/2018 1145 Last data filed at 11/24/2018 1000 Gross per 24 hour  Intake 520 ml  Output 250 ml  Net 270 ml   Filed Weights   11/22/18 1504 11/24/18 0401  Weight: 90.7 kg 83.6 kg    Exam: In bed, sitting upright Crackles at bases of b/l lungs rrr + pretibial edema  Data Reviewed:   I have personally reviewed following labs and imaging studies:  Labs: Labs show the following:   Basic Metabolic Panel: Recent Labs  Lab 11/22/18 1506 11/23/18 0630  11/24/18 0405  NA 141 141 141  K 4.8 4.5 3.9  CL 107 107 105  CO2 24 21* 24  GLUCOSE 144* 120* 97  BUN 43* 42* 50*  CREATININE 2.63* 2.66* 2.93*  CALCIUM 10.2 10.2 10.3  MG  --  2.3  --    GFR Estimated Creatinine Clearance: 16.2 mL/min (A) (by C-G formula based on SCr of 2.93 mg/dL (H)). Liver Function Tests: Recent Labs  Lab 11/22/18 1506 11/23/18 0630  AST 21 24  ALT 23 23  ALKPHOS 97 97  BILITOT 0.9 1.3*  PROT 7.0 7.1  ALBUMIN 3.6 3.5   No results for input(s): LIPASE,  AMYLASE in the last 168 hours. No results for input(s): AMMONIA in the last 168 hours. Coagulation profile No results for input(s): INR, PROTIME in the last 168 hours.  CBC: Recent Labs  Lab 11/22/18 1506 11/23/18 0630  WBC 10.3 11.7*  NEUTROABS 8.0* 7.7  HGB 12.1 12.6  HCT 38.7 40.3  MCV 105.4* 104.4*  PLT 220 225   Cardiac Enzymes: No results for input(s): CKTOTAL, CKMB, CKMBINDEX, TROPONINI in the last 168 hours. BNP (last 3 results) No results for input(s): PROBNP in the last 8760 hours. CBG: No results for input(s): GLUCAP in the last 168 hours. D-Dimer: Recent Labs    11/23/18 0630  DDIMER 5.42*   Hgb A1c: No results for input(s): HGBA1C in the last 72 hours. Lipid Profile: No results for input(s): CHOL, HDL, LDLCALC, TRIG, CHOLHDL, LDLDIRECT in the last 72 hours. Thyroid function studies: Recent Labs    11/23/18 0630  TSH 1.660   Anemia work up: No results for input(s): VITAMINB12, FOLATE, FERRITIN, TIBC, IRON, RETICCTPCT in the last 72 hours. Sepsis Labs: Recent Labs  Lab 11/22/18 1506 11/23/18 0630  WBC 10.3 11.7*    Microbiology Recent Results (from the past 240 hour(s))  SARS CORONAVIRUS 2 (TAT 6-24 HRS) Nasopharyngeal Nasopharyngeal Swab     Status: None   Collection Time: 11/23/18  4:59 AM   Specimen: Nasopharyngeal Swab  Result Value Ref Range Status   SARS Coronavirus 2 NEGATIVE NEGATIVE Final    Comment: (NOTE) SARS-CoV-2 target nucleic acids are NOT DETECTED. The SARS-CoV-2 RNA is generally detectable in upper and lower respiratory specimens during the acute phase of infection. Negative results do not preclude SARS-CoV-2 infection, do not rule out co-infections with other pathogens, and should not be used as the sole basis for treatment or other patient management decisions. Negative results must be combined with clinical observations, patient history, and epidemiological information. The expected result is Negative. Fact Sheet for  Patients: SugarRoll.be Fact Sheet for Healthcare Providers: https://www.woods-mathews.com/ This test is not yet approved or cleared by the [place] FDA and  has been authorized for detection and/or diagnosis of SARS-CoV-2 by FDA under an Emergency Use Authorization (EUA). This EUA will remain  in effect (meaning this test can be used) for the duration of the COVID-19 declaration under Section 56 4(b)(1) of the Act, 21 U.S.C. section 360bbb-3(b)(1), unless the authorization is terminated or revoked sooner. Performed at [facility], [place] [address], [place], [place] [address]     Procedures and diagnostic studies:  Dg Chest 2 View  Result Date: 11/22/2018 CLINICAL DATA:  Shortness of breath for 4-5 days EXAM: CHEST - 2 VIEW COMPARISON:  12/18/2016, 05/16/2008 FINDINGS: Trace right pleural effusion. Right basilar airspace disease likely reflecting atelectasis. Mild bilateral interstitial thickening unchanged from multiple prior examinations likely chronic. Stable cardiomediastinal silhouette. No acute osseous  abnormality. IMPRESSION: Trace right pleural effusion and right basilar atelectasis. Electronically Signed   By: Kathreen Devoid   On: 11/22/2018 16:53   Nm Pulmonary Perf And Vent  Result Date: 11/23/2018 CLINICAL DATA:  Concern pulmonary embolism. Acute respiratory failure. Congestive heart failure. Elevated D-dimer EXAM: NUCLEAR MEDICINE VENTILATION - PERFUSION LUNG SCAN TECHNIQUE: Ventilation images were obtained in multiple projections using inhaled aerosol Tc-21m DTPA. Perfusion images were obtained in multiple projections after intravenous injection of Tc-40m MAA. RADIOPHARMACEUTICALS:  Thirty-two mCi of Tc-39m DTPA aerosol inhalation and 1.45 mCi Tc32m MAA IV COMPARISON:  None. FINDINGS: Ventilation: Small ventilation defect in the posterior RIGHT lung base. Mild decreased ventilation to the upper lobes. Otherwise normal ventilation.  Perfusion: Decreased perfusion to the posterior RIGHT lower lobe matches the ventilation defect. No wedge-shaped peripheral perfusion defects to suggests acute pulmonary embolism. IMPRESSION: 1. No evidence of acute pulmonary embolism. 2. Matched defect in the RIGHT lower lobe is favored atelectasis. Electronically Signed   By: Suzy Bouchard M.D.   On: 11/23/2018 14:40    Medications:    allopurinol  100 mg Oral Daily   amLODipine  5 mg Oral QHS   aspirin EC  81 mg Oral Daily   atenolol  25 mg Oral Daily   calcium-vitamin D  1 tablet Oral QAC supper   citalopram  40 mg Oral Daily   ferrous sulfate  325 mg Oral Q breakfast   furosemide  40 mg Intravenous Daily   heparin  5,000 Units Subcutaneous Q8H   levothyroxine  50 mcg Oral QAC breakfast   pantoprazole  40 mg Oral Daily   predniSONE  5 mg Oral Q breakfast   Continuous Infusions:   LOS: 0 days   Geradine Girt  [facility]   How to contact the [facility] Attending or Consulting provider [place] or covering provider during after hours [place], for this patient?  1. Check the care team in [facility] and look for a) attending/consulting TRH provider listed and b) the [facility] team listed 2. Log into [email or web address] and use [place]'s universal password to access. If you do not have the password, please contact the hospital operator. 3. Locate the [facility] provider you are looking for under [facility] and page to a number that you can be directly reached. 4. If you still have difficulty reaching the provider, please page the [facility] (Director on Call) for the Hospitalists listed on amion for assistance.  11/24/2018, 11:45 AM

## 2018-11-24 NOTE — TOC Initial Note (Signed)
Transition of Care [facility]) - Initial/Assessment Note    Patient Details  Name: Stacy Morris MRN: [id number] Date of Birth: December 23, 1933  Transition [facility] CM/SW Contact:    Bartholomew Crews, RN Phone Number: [phone] 11/24/2018, 9:54 AM  Clinical Narrative:                 Spoke with patient at bedside. PTA home alone. Uses her walker "all the time." Has family in "yelling distance." No HH services in place or needed. No problems obtaining or managing medications. Patient reports has transportation home. No transition of care needs identified at this time.   Expected Discharge Plan: Home/Self Care Barriers to Discharge: Continued Medical Work up   Patient Goals and CMS Choice Patient states their goals for this hospitalization and ongoing recovery are:: hopes to go home today CMS Medicare.gov Compare Post Acute Care list provided to:: Patient Choice offered to / list presented to : NA  Expected Discharge Plan and Services Expected Discharge Plan: Home/Self Care In-house Referral: NA Discharge Planning Services: CM Consult Post Acute Care Choice: NA Living arrangements for the past 2 months: Single Family Home                 DME Arranged: N/A DME Agency: NA       HH Arranged: NA HH Agency: NA        Prior Living Arrangements/Services Living arrangements for the past 2 months: Single Family Home Lives with:: Self Patient language and need for interpreter reviewed:: Yes Do you feel safe going back to the place where you live?: Yes          Current home services: DME Criminal Activity/Legal Involvement Pertinent to Current Situation/Hospitalization: No - Comment as needed  Activities of Daily Living Home Assistive Devices/Equipment: Environmental consultant (specify type), Eyeglasses ADL Screening (condition at time of admission) Patient's cognitive ability adequate to safely complete daily activities?: Yes Is the patient deaf or have difficulty hearing?: Yes Does the patient  have difficulty seeing, even when wearing glasses/contacts?: No Does the patient have difficulty concentrating, remembering, or making decisions?: No Patient able to express need for assistance with ADLs?: Yes Does the patient have difficulty dressing or bathing?: No Independently performs ADLs?: Yes (appropriate for developmental age) Does the patient have difficulty walking or climbing stairs?: Yes Weakness of Legs: Both Weakness of Arms/Hands: None  Permission Sought/Granted                  Emotional Assessment Appearance:: Appears stated age Attitude/Demeanor/Rapport: Engaged Affect (typically observed): Accepting Orientation: : Oriented to Self, Oriented to Place, Oriented to  Time, Oriented to Situation Alcohol / Substance Use: Not Applicable Psych Involvement: No (comment)  Admission diagnosis:  Renal insufficiency [N28.9] Poorly-controlled hypertension [I10] Acute heart failure, unspecified heart failure type ([place]) [I50.9] Acute respiratory failure with hypoxia ([place]) [J96.01] Patient Active Problem List   Diagnosis Date Noted  . Acute respiratory failure with hypoxia ([place]) 11/23/2018  . Hypertensive urgency 11/23/2018  . CAP (community acquired pneumonia) 11/23/2018  . Abnormal urinalysis   . HTN (hypertension)   . Labile blood pressure   . Leukocytosis   . Uncontrolled hypertension   . Benign essential HTN   . Chronic kidney disease (CKD), stage IV (severe) ([place])   . Post-operative pain   . Closed displaced supracondylar fracture with intracondylar extension of lower end of left femur ([place]) 12/23/2016  . Fall   . Fracture   . Acute blood loss anemia   .  History of gout   . Constipation due to pain medication   . Closed comminuted intra-articular fracture of distal end of left femur ([place]) 12/18/2016  . CKD (chronic kidney disease), stage III 12/18/2016    Class: Chronic  . Hypothyroidism 12/18/2016    Class: Chronic  . Renal failure (ARF), acute on  chronic (HCC) 04/09/2014  . Rheumatoid arthritis ([place]) 04/09/2014  . Essential hypertension 04/09/2014  . Vomiting and diarrhea 04/09/2014  . Acute on chronic renal failure ([place]) 04/09/2014   PCP:  Heywood Bene, PA-C Pharmacy:   [id number] - [place], [place] - [address] [place] [address] Phone: [phone] Fax: [phone]     Social Determinants of Health (SDOH) Interventions    Readmission Risk Interventions No flowsheet data found.

## 2018-11-25 LAB — CBC
HCT: 36.3 % (ref 36.0–46.0)
Hemoglobin: 11.6 g/dL — ABNORMAL LOW (ref 12.0–15.0)
MCH: 32.5 pg (ref 26.0–34.0)
MCHC: 32 g/dL (ref 30.0–36.0)
MCV: 101.7 fL — ABNORMAL HIGH (ref 80.0–100.0)
Platelets: 219 10*3/uL (ref 150–400)
RBC: 3.57 MIL/uL — ABNORMAL LOW (ref 3.87–5.11)
RDW: 14.1 % (ref 11.5–15.5)
WBC: 10.4 10*3/uL (ref 4.0–10.5)
nRBC: 0 % (ref 0.0–0.2)

## 2018-11-25 LAB — BASIC METABOLIC PANEL
Anion gap: 12 (ref 5–15)
BUN: 48 mg/dL — ABNORMAL HIGH (ref 8–23)
CO2: 24 mmol/L (ref 22–32)
Calcium: 10.3 mg/dL (ref 8.9–10.3)
Chloride: 105 mmol/L (ref 98–111)
Creatinine, Ser: 2.81 mg/dL — ABNORMAL HIGH (ref 0.44–1.00)
GFR calc Af Amer: 17 mL/min — ABNORMAL LOW (ref >60)
GFR calc non Af Amer: 15 mL/min — ABNORMAL LOW (ref >60)
Glucose, Bld: 87 mg/dL (ref 70–99)
Potassium: 3.9 mmol/L (ref 3.5–5.1)
Sodium: 141 mmol/L (ref 135–145)

## 2018-11-25 MED ORDER — FUROSEMIDE 10 MG/ML IJ SOLN
60.0000 mg | Freq: Every day | INTRAMUSCULAR | Status: DC
  Administered 2018-11-25 – 2018-11-26 (×2): 60 mg via INTRAVENOUS
  Filled 2018-11-25 (×2): qty 6

## 2018-11-25 NOTE — Progress Notes (Signed)
Progress Note    Stacy Morris  OIZ:[id number] DOB: 1934/01/07  DOA: 11/22/2018 PCP: Heywood Bene, PA-C    Brief Narrative:     Medical records reviewed and are as summarized below:  Stacy Morris is an 85 y.o. female with history of rheumatoid arthritis on prednisone and Enbrel, hypertension, hypothyroidism and chronic kidney disease stage IV has been experiencing shortness of breath with orthopnea and paroxysmal nocturnal dyspnea over the last 3 to 4 weeks with some vague discomfort of the right upper extremity radiating to the leg.  Had gone to her PCP 3 days ago and was placed on antibiotics for possible pneumonia.  Patient denies any productive cough fever chills.  Denies any nausea vomiting or diarrhea.  Assessment/Plan:   Principal Problem:   Acute respiratory failure with hypoxia (HCC) Active Problems:   Rheumatoid arthritis (HCC)   Hypothyroidism   Chronic kidney disease (CKD), stage IV (severe) (HCC)   HTN (hypertension)   Hypertensive urgency   CAP (community acquired pneumonia)   Acute diastolic CHF (congestive heart failure) (HCC)   Acute respiratory failure with hypoxia likely from acute diastolic CHF -oxygen saturation level dropped into 80's with ambulation on room air-plan for home O2 eval in AM -D-dimer elevated but VQ scan low probability - 2D echo: Left ventricular diastolic Doppler parameters are consistent with impaired relaxation pattern of LV diastolic filling. -strict intake output -needs Continued IV Lasix -- responding well-- will increased to 60mg  -weight down from 200 to 183  Hypertensive urgency - likely contributing to patient's symptoms.  -Patient reports BP usually controlled.  -Home meds include norvasc, atenolol.  -Resumed home meds  Rheumatoid arthritis  - Enbrel and prednisone.  Hypothyroidism  -Synthroid.  History of gout - on allopurinol.  Neck pain -trial of voltaren gel -seems improved today  Chronic  kidney disease stage IV creatinine appears to be at baseline.  -Closely monitor basic metabolic closely since patient is on IV Lasix. -follow with Dr. Marval Regal at [facility]  CAP. Not likely. Xray as noted above.  -d/c abx   Family Communication/Anticipated D/C date and plan/Code Status   DVT prophylaxis: heparin Code Status: Full Code.  Family Communication: son at bedside 10/10 Disposition Plan: still showing signs of volume overload so needs continued diuresis with IV medications- hope she will be ready in the AM-- will need to be discharged on higher dose of lasix than her home 20 mg   Medical Consultants:    None.     Subjective:   Not able to lay flat yet w/o feeling of smothering  Objective:    Vitals:   11/24/18 1646 11/24/18 1949 11/25/18 0443 11/25/18 0906  BP: (!) 171/66 (!) 178/70 (!) 172/81 (!) 159/73  Pulse: 62 62 64 63  Resp: 18 18 20 18   Temp: 98.3 F (36.8 C) 98.3 F (36.8 C) 97.9 F (36.6 C) 98 F (36.7 C)  TempSrc: Oral Oral Oral Oral  SpO2: 93% 92% 91% 92%  Weight:   83.1 kg   Height:        Intake/Output Summary (Last 24 hours) at 11/25/2018 1032 Last data filed at 11/25/2018 0700 Gross per 24 hour  Intake 760 ml  Output 902 ml  Net -142 ml   Filed Weights   11/22/18 1504 11/24/18 0401 11/25/18 0443  Weight: 90.7 kg 83.6 kg 83.1 kg    Exam: On side of bed NAD rrr Crackles at bases but improved from yesterday Min pretibial edema  Data Reviewed:   I have personally reviewed following labs and imaging studies:  Labs: Labs show the following:   Basic Metabolic Panel: Recent Labs  Lab 11/22/18 1506 11/23/18 0630 11/24/18 0405 11/25/18 0541  NA 141 141 141 141  K 4.8 4.5 3.9 3.9  CL 107 107 105 105  CO2 24 21* 24 24  GLUCOSE 144* 120* 97 87  BUN 43* 42* 50* 48*  CREATININE 2.63* 2.66* 2.93* 2.81*  CALCIUM 10.2 10.2 10.3 10.3  MG  --  2.3  --   --    GFR Estimated Creatinine Clearance: 16.8 mL/min (A)  (by C-G formula based on SCr of 2.81 mg/dL (H)). Liver Function Tests: Recent Labs  Lab 11/22/18 1506 11/23/18 0630  AST 21 24  ALT 23 23  ALKPHOS 97 97  BILITOT 0.9 1.3*  PROT 7.0 7.1  ALBUMIN 3.6 3.5   No results for input(s): LIPASE, AMYLASE in the last 168 hours. No results for input(s): AMMONIA in the last 168 hours. Coagulation profile No results for input(s): INR, PROTIME in the last 168 hours.  CBC: Recent Labs  Lab 11/22/18 1506 11/23/18 0630 11/25/18 0541  WBC 10.3 11.7* 10.4  NEUTROABS 8.0* 7.7  --   HGB 12.1 12.6 11.6*  HCT 38.7 40.3 36.3  MCV 105.4* 104.4* 101.7*  PLT 220 225 219   Cardiac Enzymes: No results for input(s): CKTOTAL, CKMB, CKMBINDEX, TROPONINI in the last 168 hours. BNP (last 3 results) No results for input(s): PROBNP in the last 8760 hours. CBG: No results for input(s): GLUCAP in the last 168 hours. D-Dimer: Recent Labs    11/23/18 0630  DDIMER 5.42*   Hgb A1c: No results for input(s): HGBA1C in the last 72 hours. Lipid Profile: No results for input(s): CHOL, HDL, LDLCALC, TRIG, CHOLHDL, LDLDIRECT in the last 72 hours. Thyroid function studies: Recent Labs    11/23/18 0630  TSH 1.660   Anemia work up: No results for input(s): VITAMINB12, FOLATE, FERRITIN, TIBC, IRON, RETICCTPCT in the last 72 hours. Sepsis Labs: Recent Labs  Lab 11/22/18 1506 11/23/18 0630 11/25/18 0541  WBC 10.3 11.7* 10.4    Microbiology Recent Results (from the past 240 hour(s))  SARS CORONAVIRUS 2 (TAT 6-24 HRS) Nasopharyngeal Nasopharyngeal Swab     Status: None   Collection Time: 11/23/18  4:59 AM   Specimen: Nasopharyngeal Swab  Result Value Ref Range Status   SARS Coronavirus 2 NEGATIVE NEGATIVE Final    Comment: (NOTE) SARS-CoV-2 target nucleic acids are NOT DETECTED. The SARS-CoV-2 RNA is generally detectable in upper and lower respiratory specimens during the acute phase of infection. Negative results do not preclude SARS-CoV-2  infection, do not rule out co-infections with other pathogens, and should not be used as the sole basis for treatment or other patient management decisions. Negative results must be combined with clinical observations, patient history, and epidemiological information. The expected result is Negative. Fact Sheet for Patients: SugarRoll.be Fact Sheet for Healthcare Providers: https://www.woods-mathews.com/ This test is not yet approved or cleared by the [place] FDA and  has been authorized for detection and/or diagnosis of SARS-CoV-2 by FDA under an Emergency Use Authorization (EUA). This EUA will remain  in effect (meaning this test can be used) for the duration of the COVID-19 declaration under Section 56 4(b)(1) of the Act, 21 U.S.C. section 360bbb-3(b)(1), unless the authorization is terminated or revoked sooner. Performed at [facility], [place] [address] [place], [place] [address]     Procedures and  diagnostic studies:  Nm Pulmonary Perf And Vent  Result Date: 11/23/2018 CLINICAL DATA:  Concern pulmonary embolism. Acute respiratory failure. Congestive heart failure. Elevated D-dimer EXAM: NUCLEAR MEDICINE VENTILATION - PERFUSION LUNG SCAN TECHNIQUE: Ventilation images were obtained in multiple projections using inhaled aerosol Tc-31m DTPA. Perfusion images were obtained in multiple projections after intravenous injection of Tc-62m MAA. RADIOPHARMACEUTICALS:  Thirty-two mCi of Tc-69m DTPA aerosol inhalation and 1.45 mCi Tc9m MAA IV COMPARISON:  None. FINDINGS: Ventilation: Small ventilation defect in the posterior RIGHT lung base. Mild decreased ventilation to the upper lobes. Otherwise normal ventilation. Perfusion: Decreased perfusion to the posterior RIGHT lower lobe matches the ventilation defect. No wedge-shaped peripheral perfusion defects to suggests acute pulmonary embolism. IMPRESSION: 1. No evidence of acute pulmonary  embolism. 2. Matched defect in the RIGHT lower lobe is favored atelectasis. Electronically Signed   By: Suzy Bouchard M.D.   On: 11/23/2018 14:40    Medications:    allopurinol  100 mg Oral Daily   amLODipine  5 mg Oral QHS   aspirin EC  81 mg Oral Daily   atenolol  25 mg Oral Daily   calcium-vitamin D  1 tablet Oral QAC supper   citalopram  20 mg Oral Daily   diclofenac sodium  2 g Topical QID   ferrous sulfate  325 mg Oral Q breakfast   furosemide  60 mg Intravenous Daily   heparin  5,000 Units Subcutaneous Q8H   levothyroxine  50 mcg Oral QAC breakfast   pantoprazole  40 mg Oral Daily   predniSONE  5 mg Oral Q breakfast   Continuous Infusions:   LOS: 1 day   Geradine Girt  [facility]   How to contact the [facility] Attending or Consulting provider [place] or covering provider during after hours [place], for this patient?  1. Check the care team in [facility] and look for a) attending/consulting TRH provider listed and b) the [facility] team listed 2. Log into [email or web address] and use [place]'s universal password to access. If you do not have the password, please contact the hospital operator. 3. Locate the [facility] provider you are looking for under [facility] and page to a number that you can be directly reached. 4. If you still have difficulty reaching the provider, please page the [facility] (Director on Call) for the Hospitalists listed on amion for assistance.  11/25/2018, 10:32 AM

## 2018-11-25 NOTE — Evaluation (Signed)
Physical Therapy Evaluation Patient Details Name: Stacy Morris MRN: [id number] DOB: 08/26/1933 Today's Date: 11/25/2018   History of Present Illness  85 y.o. female with history of rheumatoid arthritis on prednisone and Enbrel, hypertension, hypothyroidism and chronic kidney disease stage IV has been experiencing shortness of breath with orthopnea and paroxysmal nocturnal dyspnea over the last 3 to 4 weeks with some vague discomfort of the right upper extremity radiating to the leg.PCP placed on antibiotics for PNA. Brought to ED with increased BP. Chest x-ray revealed R sided pleural effusion. Admitted 10/9 for treatment of acute respiratory failure.  Clinical Impression  PTA pt living alone in single story home with ramped entrance. Pt ambulates with RW and independent with ADLS and iADLs. Pt with c/o of neck and R shoulder pain 4/10 which is helped by topical pain medication. Pt is limited in safe mobility by 2/4 DoE with ambulation, as well as generalized weakness. Pt able to maintain SaO2 on RA >93%O2 with ambulation, so will likely not require supplemental O2 for discharge. Pt is mod I for bed mobility, supervision for transfers and min guard for ambulation of 100 feet with RW. Pt experiences slight increase in shoulder and neck pain with 2/4 DoE with ambulation. Pain likely from increased use of accessory muscles for breathing last 3-4 weeks. PT recommending HHPT for management of R shoulder and neck pain. PT will continue to follow acutely.     Follow Up Recommendations Home health PT;Supervision - Intermittent    Equipment Recommendations  None recommended by PT    Recommendations for Other Services       Precautions / Restrictions Restrictions Weight Bearing Restrictions: No      Mobility  Bed Mobility Overal bed mobility: Needs Assistance Bed Mobility: Supine to Sit;Sit to Supine     Supine to sit: Modified independent (Device/Increase time);HOB elevated     General  bed mobility comments: mod I for increased time and effort with HoB elevated  Transfers Overall transfer level: Needs assistance Equipment used: Rolling walker (2 wheeled) Transfers: Sit to/from Stand Sit to Stand: Supervision         General transfer comment: supervision for safety  Ambulation/Gait Ambulation/Gait assistance: Min guard Gait Distance (Feet): 100 Feet Assistive device: Rolling walker (2 wheeled) Gait Pattern/deviations: Step-through pattern;WFL(Within Functional Limits);Decreased step length - right;Decreased step length - left Gait velocity: slowed Gait velocity interpretation: <1.31 ft/sec, indicative of household ambulator General Gait Details: min guard for safety with slow, steady gait         Balance Overall balance assessment: Mild deficits observed, not formally tested                                           Pertinent Vitals/Pain Pain Assessment: 0-10 Pain Score: 4  Pain Location: R shoulder and neck Pain Descriptors / Indicators: Sore Pain Intervention(s): Limited activity within patient's tolerance;Monitored during session;Repositioned    Home Living Family/patient expects to be discharged to:: Private residence Living Arrangements: Alone Available Help at Discharge: Family;Available 24 hours/day Type of Home: House Home Access: Ramped entrance     Home Layout: One level Home Equipment: Walker - 2 wheels;Tub bench      Prior Function Level of Independence: Independent with assistive device(s)                  Extremity/Trunk Assessment   Upper Extremity Assessment Upper  Extremity Assessment: RUE deficits/detail RUE Deficits / Details: shoulder flexion, abduction limited by pain, elbow and wrist WFL RUE: Unable to fully assess due to pain    Lower Extremity Assessment Lower Extremity Assessment: Generalized weakness    Cervical / Trunk Assessment Cervical / Trunk Assessment: Other  exceptions(decreased cervical L rotation, L lateral flexion due to pain)  Communication   Communication: HOH  Cognition Arousal/Alertness: Awake/alert Behavior During Therapy: WFL for tasks assessed/performed Overall Cognitive Status: Within Functional Limits for tasks assessed                                        General Comments General comments (skin integrity, edema, etc.): slight edema in B hands        Assessment/Plan    PT Assessment Patient needs continued PT services  PT Problem List Decreased range of motion;Decreased activity tolerance;Decreased mobility;Pain       PT Treatment Interventions DME instruction;Gait training;Functional mobility training;Therapeutic activities;Therapeutic exercise;Balance training;Cognitive remediation;Patient/family education    PT Goals (Current goals can be found in the Care Plan section)  Acute Rehab PT Goals Patient Stated Goal: have less shoulder pain  PT Goal Formulation: With patient/family Time For Goal Achievement: 12/09/18 Potential to Achieve Goals: Good    Frequency Min 3X/week    AM-PAC PT "6 Clicks" Mobility  Outcome Measure Help needed turning from your back to your side while in a flat bed without using bedrails?: None Help needed moving from lying on your back to sitting on the side of a flat bed without using bedrails?: None Help needed moving to and from a bed to a chair (including a wheelchair)?: None Help needed standing up from a chair using your arms (e.g., wheelchair or bedside chair)?: None Help needed to walk in hospital room?: None Help needed climbing 3-5 steps with a railing? : A Little 6 Click Score: 23    End of Session Equipment Utilized During Treatment: Gait belt Activity Tolerance: Patient tolerated treatment well Patient left: in bed;with call bell/phone within reach;with family/visitor present Nurse Communication: Mobility status;Other (comment)(oxygen saturation) PT Visit  Diagnosis: Pain;Muscle weakness (generalized) (M62.81) Pain - Right/Left: Right Pain - part of body: Shoulder(neck)    Time: 1356-1430 PT Time Calculation (min) (ACUTE ONLY): 34 min   Charges:   PT Evaluation $PT Eval Moderate Complexity: 1 Mod PT Treatments $Gait Training: 8-22 mins        Keairra Bardon B. Migdalia Dk PT, DPT Acute Rehabilitation Services Pager [phone] Office [phone]   [place] 11/25/2018, 2:51 PM

## 2018-11-25 NOTE — Plan of Care (Signed)
  Problem: Clinical Measurements: Goal: Respiratory complications will improve Outcome: Progressing   Problem: Activity: Goal: Risk for activity intolerance will decrease Outcome: Progressing   

## 2018-11-25 NOTE — Progress Notes (Signed)
SATURATION QUALIFICATIONS: (This note is used to comply with regulatory documentation for home oxygen)  Patient Saturations on Room Air at Rest = 93%  Patient Saturations on Room Air while Ambulating = 93-94%  Patient Saturations on o Liters of oxygen while Ambulating = 91-94%  Please briefly explain why patient needs home oxygen: Pt able to maintain SaO2 on RA >93%O2 with ambulation,so will likely not require supplemental O2 for discharge.

## 2018-11-26 ENCOUNTER — Encounter (HOSPITAL_COMMUNITY): Payer: Self-pay | Admitting: Internal Medicine

## 2018-11-26 DIAGNOSIS — I5030 Unspecified diastolic (congestive) heart failure: Secondary | ICD-10-CM

## 2018-11-26 HISTORY — DX: Unspecified diastolic (congestive) heart failure: I50.30

## 2018-11-26 LAB — BASIC METABOLIC PANEL
Anion gap: 12 (ref 5–15)
BUN: 55 mg/dL — ABNORMAL HIGH (ref 8–23)
CO2: 27 mmol/L (ref 22–32)
Calcium: 10.5 mg/dL — ABNORMAL HIGH (ref 8.9–10.3)
Chloride: 103 mmol/L (ref 98–111)
Creatinine, Ser: 3.08 mg/dL — ABNORMAL HIGH (ref 0.44–1.00)
GFR calc Af Amer: 15 mL/min — ABNORMAL LOW (ref >60)
GFR calc non Af Amer: 13 mL/min — ABNORMAL LOW (ref >60)
Glucose, Bld: 101 mg/dL — ABNORMAL HIGH (ref 70–99)
Potassium: 3.7 mmol/L (ref 3.5–5.1)
Sodium: 142 mmol/L (ref 135–145)

## 2018-11-26 LAB — CBC
HCT: 38.4 % (ref 36.0–46.0)
Hemoglobin: 12.7 g/dL (ref 12.0–15.0)
MCH: 33 pg (ref 26.0–34.0)
MCHC: 33.1 g/dL (ref 30.0–36.0)
MCV: 99.7 fL (ref 80.0–100.0)
Platelets: 207 10*3/uL (ref 150–400)
RBC: 3.85 MIL/uL — ABNORMAL LOW (ref 3.87–5.11)
RDW: 14.3 % (ref 11.5–15.5)
WBC: 10.8 10*3/uL — ABNORMAL HIGH (ref 4.0–10.5)
nRBC: 0 % (ref 0.0–0.2)

## 2018-11-26 MED ORDER — FUROSEMIDE 40 MG PO TABS: 40.0000 mg | ORAL_TABLET | Freq: Every day | ORAL | 11 refills | Status: DC

## 2018-11-26 MED ORDER — POTASSIUM CHLORIDE ER 10 MEQ PO TBCR: 10.0000 meq | EXTENDED_RELEASE_TABLET | Freq: Every day | ORAL | 0 refills | Status: AC

## 2018-11-26 MED ORDER — DICLOFENAC SODIUM 1 % TD GEL: 2.0000 g | Freq: Four times a day (QID) | TRANSDERMAL | Status: AC

## 2018-11-26 MED ORDER — CITALOPRAM HYDROBROMIDE 20 MG PO TABS: 20.0000 mg | ORAL_TABLET | Freq: Every day | ORAL | 1 refills | Status: AC

## 2018-11-26 NOTE — Discharge Instructions (Signed)
Last weight on 10/12: 184  If voltaren gel expensive through your insurance, is now sold OTC

## 2018-11-26 NOTE — Progress Notes (Signed)
Delrae Sawyers to be discharged Home per MD order. Discussed prescriptions and follow up appointments with the patient. Prescriptions given to patient; medication list explained in detail. Patient verbalized understanding.  Skin clean, dry and intact without evidence of skin break down, no evidence of skin tears noted. IV catheter discontinued intact. Site without signs and symptoms of complications. Dressing and pressure applied. Pt denies pain at the site currently. No complaints noted.  Patient free of lines, drains, and wounds.   An After Visit Summary (AVS) was printed and given to the patient. Patient escorted via wheelchair, and discharged home via private auto.  Shela Commons, RN

## 2018-11-26 NOTE — TOC Transition Note (Signed)
Transition of Care [facility]) - CM/SW Discharge Note   Patient Details  Name: [name] MRN: [id number] Date of Birth: [birth year]  Transition of Care [facility]) CM/SW Contact:  Bartholomew Crews, RN Phone Number: [phone] 11/26/2018, 12:20 PM   Clinical Narrative:    Spoke with patient at the bedside. Discussed recommendations for home health. Patient in agreement. Referral accepted by [facility] for nursing and PT. Patient to transition home today. No further transition of care needs identified.    Final next level of care: [place] Barriers to Discharge: No Barriers Identified   Patient Goals and CMS Choice Patient states their goals for this hospitalization and ongoing recovery are:: glad to go home today CMS Medicare.gov Compare Post Acute Care list provided to:: Patient Choice offered to / list presented to : Patient  Discharge Placement                       Discharge Plan and Services In-house Referral: NA Discharge Planning Services: CM Consult Post Acute Care Choice: NA          DME Arranged: N/A DME Agency: NA       HH Arranged: RN, PT [place] Agency: [facility] Date [place] Agency Contacted: 11/26/18 Time [place]: 1219 Representative spoke with at [place]: [place] (SDOH) Interventions     Readmission Risk Interventions No flowsheet data found.

## 2018-11-26 NOTE — Discharge Summary (Addendum)
Physician Discharge Summary  Stacy Morris ZMO:[id number] DOB: [birth year] DOA: 11/22/2018  PCP: Heywood Bene, PA-C  Admit date: 11/22/2018 Discharge date: 11/26/2018  Time spent: 45 minutes  Recommendations for Outpatient Follow-up:  1. Follow up with PCP 1 for evaluation of volume status. Recommend bmet to track creatinine level-- adjust Kdur as needed 2. Follow up with nephrologist as scheduled for evaluation of kidney function as lasix dose increased.  3. Home health PT and RN for evaluation/monitoring/strength and endurence   Discharge Diagnoses:  Principal Problem:   Acute respiratory failure with hypoxia ([place]) Active Problems:   Rheumatoid arthritis ([place])   Hypothyroidism   Chronic kidney disease (CKD), stage IV (severe) (H[facility])   HTN (hypertension)   Hypertensive urgency   CAP (community acquired pneumonia)   Acute diastolic CHF (congestive heart failure) ([place])   Discharge Condition: stab;e  Diet recommendation: heart healthy  Filed Weights   11/24/18 0401 11/25/18 0443 11/26/18 0500  Weight: 83.6 kg 83.1 kg 83.1 kg    History of present illness:  Stacy Morris is a 85 y.o. female with history of rheumatoid arthritis on prednisone and Enbrel, hypertension, hypothyroidism and chronic kidney disease stage IV had been experiencing shortness of breath with orthopnea and paroxysmal nocturnal dyspnea over the previous 3 to 4 weeks with some vague discomfort of the right upper extremity radiating to the leg.  Had gone to her PCP 3 days prior and was placed on antibiotics for possible pneumonia.  Patient denied any productive cough fever chills.  Denied any nausea vomiting or diarrhea. Work up revealed acute respiratory failure with hypoxia likely related to acute diastolic heart failure in setting of possible pneumonia   Hospital Course:  Acute respiratory failure with hypoxia likely from acute diastolic CHF. Oxygen saturation level dropped into 80's with ambulation  on room air. On day of discharge oxygen saturation level maintained above 90% with ambulation on room air. D-dimer elevated butVQ scan low probability.2D echo: Left ventricular diastolic Doppler parameters are consistent with impaired relaxation pattern of LV diastolic filling. She received IV lasix and weight down from 200 to 183. Will discharge with increase in home lasix dose from 20 to 40mg  daily. Recommend daily weights with instructions to call MD if weight gain. Follow up with PCP 1 week  Hypertensive urgency. Likely contributing to patient's symptoms.Patient reports BP usually controlled. Home meds include norvasc, atenolol. Resumed home meds. Fair control at discharge. Recommend close OP follow up for monitoring of BP controll  Rheumatoid arthritis. Stable at baseline  - Enbrel and prednisone.  Hypothyroidism. tsh 1. 6. Continue home synthroid  History of gout. Stable at baseline.  Neck pain. voltaren gel with some improvement  Chronic kidney disease stage IV creatinine appears to be at baseline. Closely monitor basic metabolic panel as Lasix dose increased at discharge. Follow with Dr. Marval Regal at [facility]  CAP. Not likely. Xray as noted above.  -d/c abx  Procedures:  Echo as noted above  Consultations:    Discharge Exam: Vitals:   11/26/18 0818 11/26/18 1150  BP: (!) 145/67 (!) 157/82  Pulse: 68 62  Resp: 18 18  Temp: 98.1 F (36.7 C) 97.9 F (36.6 C)  SpO2: 92% 94%    General: awake alert no acute distress Cardiovascular: rrr no mgr no LE edema PPP Respiratory: normal effort BS slightly distant but clear bilaterally no wheeze no crackles  Discharge Instructions   Discharge Instructions    (HEART FAILURE PATIENTS) Call MD:  Anytime you have  any of the following symptoms: 1) 3 pound weight gain in 24 hours or 5 pounds in 1 week 2) shortness of breath, with or without a dry hacking cough 3) swelling in the hands, feet or stomach 4) if you  have to sleep on extra pillows at night in order to breathe.   Complete by: As directed    Diet - low sodium heart healthy   Complete by: As directed    Discharge instructions   Complete by: As directed    Take medications as prescribed Follow up with PCP 1-2 weeks for evaluation of volume status   Heart Failure patients record your daily weight using the same scale at the same time of day   Complete by: As directed    Increase activity slowly   Complete by: As directed      Allergies as of 11/26/2018      Reactions   Augmentin [amoxicillin-pot Clavulanate] Diarrhea   Did it involve swelling of the face/tongue/throat, SOB, or low BP? No Did it involve sudden or severe rash/hives, skin peeling, or any reaction on the inside of your mouth or nose? No Did you need to seek medical attention at a hospital or doctor's office? No When did it last happen?10.7.20 If all above answers are "NO", may proceed with cephalosporin use.   Codeine Nausea And Vomiting      Medication List    STOP taking these medications   amoxicillin-clavulanate 875-125 MG tablet Commonly known as: AUGMENTIN   azithromycin 250 MG tablet Commonly known as: ZITHROMAX     TAKE these medications   acetaminophen 500 MG tablet Commonly known as: TYLENOL Take 1,000-1,500 mg by mouth every 6 (six) hours as needed for moderate pain (back/shoulder pain). What changed: Another medication with the same name was removed. Continue taking this medication, and follow the directions you see here.   allopurinol 100 MG tablet Commonly known as: ZYLOPRIM Take 100 mg by mouth daily.   ALPRAZolam 0.5 MG tablet Commonly known as: XANAX Take 0.5 mg by mouth 3 (three) times daily as needed for anxiety.   amLODipine 5 MG tablet Commonly known as: NORVASC Take 1 tablet (5 mg total) by mouth at bedtime.   aspirin 81 MG tablet Take 81 mg by mouth daily.   atenolol 25 MG tablet Commonly known as: TENORMIN Take 1  tablet (25 mg total) by mouth daily.   CALCIUM + D PO Take 1 tablet by mouth daily.   citalopram 20 MG tablet Commonly known as: CELEXA Take 1 tablet (20 mg total) by mouth daily. Start taking on: November 27, 2018 What changed:   medication strength  how much to take   diclofenac sodium 1 % Gel Commonly known as: VOLTAREN Apply 2 g topically 4 (four) times daily.   etanercept 50 MG/ML injection Commonly known as: ENBREL Inject 50 mg into the skin every Monday.   ferrous sulfate 325 (65 FE) MG tablet Take 325 mg by mouth daily with breakfast.   furosemide 40 MG tablet Commonly known as: Lasix Take 1 tablet (40 mg total) by mouth daily. Start taking on: November 28, 2018 What changed:   medication strength  how much to take  These instructions start on November 28, 2018. If you are unsure what to do until then, ask your doctor or other care provider.   hydroxypropyl methylcellulose / hypromellose 2.5 % ophthalmic solution Commonly known as: ISOPTO TEARS / GONIOVISC Place 1 drop into both eyes 3 (three) times  daily as needed for dry eyes.   levothyroxine 50 MCG tablet Commonly known as: SYNTHROID Take 50 mcg by mouth daily before breakfast.   loperamide 2 MG capsule Commonly known as: IMODIUM Take 4 mg by mouth as needed for diarrhea or loose stools.   omeprazole 20 MG capsule Commonly known as: PRILOSEC Take 20 mg by mouth daily.   potassium chloride 10 MEQ tablet Commonly known as: KLOR-CON Take 1 tablet (10 mEq total) by mouth daily.   predniSONE 5 MG tablet Commonly known as: DELTASONE Take 5 mg by mouth daily with breakfast.   PRESERVISION AREDS 2 PO Take 1 capsule by mouth 2 (two) times a day.   tiZANidine 2 MG tablet Commonly known as: ZANAFLEX Take 2 mg by mouth every 6 (six) hours as needed for muscle spasms.      Allergies  Allergen Reactions  . Augmentin [Amoxicillin-Pot Clavulanate] Diarrhea    Did it involve swelling of the  face/tongue/throat, SOB, or low BP? No Did it involve sudden or severe rash/hives, skin peeling, or any reaction on the inside of your mouth or nose? No Did you need to seek medical attention at a hospital or doctor's office? No When did it last happen?10.7.20 If all above answers are "NO", may proceed with cephalosporin use.   . Codeine Nausea And Vomiting   Follow-up Information    Heywood Bene, PA-C Follow up in 1 week(s).   Specialty: Physician Assistant Why: with BMP Contact information: [address] [place] [address] [phone]        [facility] Follow up.   Specialty: Home Health Services Why: nursing and physical therapy  Contact information: [address] [place] [address] [phone]            The results of significant diagnostics from this hospitalization (including imaging, microbiology, ancillary and laboratory) are listed below for reference.    Significant Diagnostic Studies: Dg Chest 2 View  Result Date: 11/22/2018 CLINICAL DATA:  Shortness of breath for 4-5 days EXAM: CHEST - 2 VIEW COMPARISON:  12/18/2016, 05/16/2008 FINDINGS: Trace right pleural effusion. Right basilar airspace disease likely reflecting atelectasis. Mild bilateral interstitial thickening unchanged from multiple prior examinations likely chronic. Stable cardiomediastinal silhouette. No acute osseous abnormality. IMPRESSION: Trace right pleural effusion and right basilar atelectasis. Electronically Signed   By: Kathreen Devoid   On: 11/22/2018 16:53   Nm Pulmonary Perf And Vent  Result Date: 11/23/2018 CLINICAL DATA:  Concern pulmonary embolism. Acute respiratory failure. Congestive heart failure. Elevated D-dimer EXAM: NUCLEAR MEDICINE VENTILATION - PERFUSION LUNG SCAN TECHNIQUE: Ventilation images were obtained in multiple projections using inhaled aerosol Tc-31m DTPA. Perfusion images were obtained in multiple projections after  intravenous injection of Tc-31m MAA. RADIOPHARMACEUTICALS:  Thirty-two mCi of Tc-66m DTPA aerosol inhalation and 1.45 mCi Tc87m MAA IV COMPARISON:  None. FINDINGS: Ventilation: Small ventilation defect in the posterior RIGHT lung base. Mild decreased ventilation to the upper lobes. Otherwise normal ventilation. Perfusion: Decreased perfusion to the posterior RIGHT lower lobe matches the ventilation defect. No wedge-shaped peripheral perfusion defects to suggests acute pulmonary embolism. IMPRESSION: 1. No evidence of acute pulmonary embolism. 2. Matched defect in the RIGHT lower lobe is favored atelectasis. Electronically Signed   By: Suzy Bouchard M.D.   On: 11/23/2018 14:40    Microbiology: Recent Results (from the past 240 hour(s))  SARS CORONAVIRUS 2 (TAT 6-24 HRS) Nasopharyngeal Nasopharyngeal Swab     Status: None   Collection  Time: 11/23/18  4:59 AM   Specimen: Nasopharyngeal Swab  Result Value Ref Range Status   SARS Coronavirus 2 NEGATIVE NEGATIVE Final    Comment: (NOTE) SARS-CoV-2 target nucleic acids are NOT DETECTED. The SARS-CoV-2 RNA is generally detectable in upper and lower respiratory specimens during the acute phase of infection. Negative results do not preclude SARS-CoV-2 infection, do not rule out co-infections with other pathogens, and should not be used as the sole basis for treatment or other patient management decisions. Negative results must be combined with clinical observations, patient history, and epidemiological information. The expected result is Negative. Fact Sheet for Patients: SugarRoll.be Fact Sheet for Healthcare Providers: https://www.woods-mathews.com/ This test is not yet approved or cleared by the [place] FDA and  has been authorized for detection and/or diagnosis of SARS-CoV-2 by FDA under an Emergency Use Authorization (EUA). This EUA will remain  in effect (meaning this test can be used) for the  duration of the COVID-19 declaration under Section 56 4(b)(1) of the Act, 21 U.S.C. section 360bbb-3(b)(1), unless the authorization is terminated or revoked sooner. Performed at [facility], [place] [address], [place], [place] [address]      Labs: Basic Metabolic Panel: Recent Labs  Lab 11/22/18 1506 11/23/18 0630 11/24/18 0405 11/25/18 0541 11/26/18 0622  NA 141 141 141 141 142  K 4.8 4.5 3.9 3.9 3.7  CL 107 107 105 105 103  CO2 24 21* 24 24 27   GLUCOSE 144* 120* 97 87 101*  BUN 43* 42* 50* 48* 55*  CREATININE 2.63* 2.66* 2.93* 2.81* 3.08*  CALCIUM 10.2 10.2 10.3 10.3 10.5*  MG  --  2.3  --   --   --    Liver Function Tests: Recent Labs  Lab 11/22/18 1506 11/23/18 0630  AST 21 24  ALT 23 23  ALKPHOS 97 97  BILITOT 0.9 1.3*  PROT 7.0 7.1  ALBUMIN 3.6 3.5   No results for input(s): LIPASE, AMYLASE in the last 168 hours. No results for input(s): AMMONIA in the last 168 hours. CBC: Recent Labs  Lab 11/22/18 1506 11/23/18 0630 11/25/18 0541 11/26/18 0622  WBC 10.3 11.7* 10.4 10.8*  NEUTROABS 8.0* 7.7  --   --   HGB 12.1 12.6 11.6* 12.7  HCT 38.7 40.3 36.3 38.4  MCV 105.4* 104.4* 101.7* 99.7  PLT 220 225 219 207   Cardiac Enzymes: No results for input(s): CKTOTAL, CKMB, CKMBINDEX, TROPONINI in the last 168 hours. BNP: BNP (last 3 results) Recent Labs    11/23/18 0100  BNP 127.4*    ProBNP (last 3 results) No results for input(s): PROBNP in the last 8760 hours.  CBG: No results for input(s): GLUCAP in the last 168 hours.     Signed:  Geradine Girt  DO [facility] 11/26/2018, 12:10 PM

## 2018-12-06 ENCOUNTER — Telehealth: Payer: Self-pay

## 2018-12-06 NOTE — Telephone Encounter (Signed)
NOTES ON FILE FROM  [facility] [phone] SENT REFERRAL TO SCHEDULING 
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
## 2019-01-18 ENCOUNTER — Ambulatory Visit: Payer: Medicare Other | Admitting: Cardiovascular Disease

## 2019-01-18 ENCOUNTER — Encounter: Payer: Self-pay | Admitting: Cardiovascular Disease

## 2019-01-18 ENCOUNTER — Other Ambulatory Visit: Payer: Self-pay

## 2019-01-18 VITALS — BP 150/68 | HR 61 | Ht 67.0 in | Wt 181.0 lb

## 2019-01-18 DIAGNOSIS — Z79899 Other long term (current) drug therapy: Secondary | ICD-10-CM

## 2019-01-18 DIAGNOSIS — I509 Heart failure, unspecified: Secondary | ICD-10-CM

## 2019-01-18 DIAGNOSIS — I10 Essential (primary) hypertension: Secondary | ICD-10-CM | POA: Diagnosis not present

## 2019-01-18 MED ORDER — AMLODIPINE BESYLATE 10 MG PO TABS: 10.0000 mg | ORAL_TABLET | Freq: Every day | ORAL | 3 refills | Status: DC

## 2019-01-18 MED ORDER — FUROSEMIDE 40 MG PO TABS: 40.0000 mg | ORAL_TABLET | Freq: Two times a day (BID) | ORAL | 3 refills | Status: DC

## 2019-01-18 NOTE — Patient Instructions (Addendum)
Medication Instructions:  Your physician has recommended you make the following change in your medication:  1- Increased Norvasc 10 mg by mouth daily 2- Increased Lasix 40 mg by mouth twice daily  *If you need a refill on your cardiac medications before your next appointment, please call your pharmacy*  Lab Work: Your physician recommends that you have lab work today- BMET and BNP  If you have labs (blood work) drawn today and your tests are completely normal, you will receive your results only by: Marland Kitchen MyChart Message (if you have MyChart) OR . A paper copy in the mail If you have any lab test that is abnormal or we need to change your treatment, we will call you to review the results.  Testing/Procedures: None ordered today.  Follow-Up: At [facility], you and your health needs are our priority.  As part of our continuing mission to provide you with exceptional heart care, we have created designated Provider Care Teams.  These Care Teams include your primary Cardiologist (physician) and Advanced Practice Providers (APPs -  Physician Assistants and Nurse Practitioners) who all work together to provide you with the care you need, when you need it.  Your next appointment:   4 weeks  The format for your next appointment:   In Person  Provider:   You may see Dr. Johnsie Cancel or one of the following Advanced Practice Providers on your designated Care Team:    Truitt Merle, NP  Cecilie Kicks, NP  Kathyrn Drown, NP

## 2019-01-19 LAB — BASIC METABOLIC PANEL
BUN/Creatinine Ratio: 18 (ref 12–28)
BUN: 52 mg/dL — ABNORMAL HIGH (ref 8–27)
CO2: 19 mmol/L — ABNORMAL LOW (ref 20–29)
Calcium: 10 mg/dL (ref 8.7–10.3)
Chloride: 103 mmol/L (ref 96–106)
Creatinine, Ser: 2.9 mg/dL — ABNORMAL HIGH (ref 0.57–1.00)
GFR calc Af Amer: 17 mL/min/{1.73_m2} — ABNORMAL LOW (ref >59)
GFR calc non Af Amer: 14 mL/min/{1.73_m2} — ABNORMAL LOW (ref >59)
Glucose: 107 mg/dL — ABNORMAL HIGH (ref 65–99)
Potassium: 4.4 mmol/L (ref 3.5–5.2)
Sodium: 138 mmol/L (ref 134–144)

## 2019-01-19 LAB — PRO B NATRIURETIC PEPTIDE: NT-Pro BNP: 1008 pg/mL — ABNORMAL HIGH (ref 0–738)

## 2019-02-19 ENCOUNTER — Ambulatory Visit: Payer: Medicare Other | Admitting: Nurse Practitioner

## 2019-07-18 ENCOUNTER — Other Ambulatory Visit: Payer: Self-pay | Admitting: Physician Assistant

## 2019-07-18 DIAGNOSIS — Z1231 Encounter for screening mammogram for malignant neoplasm of breast: Secondary | ICD-10-CM

## 2019-07-25 ENCOUNTER — Ambulatory Visit
Admission: RE | Admit: 2019-07-25 | Discharge: 2019-07-25 | Disposition: A | Discharge status: Home / Self Care | Payer: Medicare PPO | Source: Ambulatory Visit | Attending: Physician Assistant | Admitting: Physician Assistant

## 2019-07-25 ENCOUNTER — Other Ambulatory Visit: Payer: Self-pay

## 2019-07-25 DIAGNOSIS — Z1231 Encounter for screening mammogram for malignant neoplasm of breast: Secondary | ICD-10-CM

## 2020-02-17 ENCOUNTER — Other Ambulatory Visit: Payer: Self-pay | Admitting: Cardiovascular Disease

## 2020-03-15 ENCOUNTER — Other Ambulatory Visit: Payer: Self-pay | Admitting: Cardiovascular Disease

## 2020-03-27 ENCOUNTER — Other Ambulatory Visit: Payer: Self-pay | Admitting: Cardiovascular Disease

## 2020-07-06 ENCOUNTER — Other Ambulatory Visit: Payer: Self-pay | Admitting: Cardiovascular Disease

## 2020-07-16 ENCOUNTER — Other Ambulatory Visit: Payer: Self-pay | Admitting: Cardiovascular Disease

## 2020-07-24 ENCOUNTER — Other Ambulatory Visit: Payer: Self-pay | Admitting: Cardiovascular Disease

## 2020-08-06 ENCOUNTER — Other Ambulatory Visit: Payer: Self-pay | Admitting: Cardiovascular Disease

## 2020-08-06 ENCOUNTER — Other Ambulatory Visit: Payer: Self-pay | Admitting: Physician Assistant

## 2020-08-06 DIAGNOSIS — Z1231 Encounter for screening mammogram for malignant neoplasm of breast: Secondary | ICD-10-CM

## 2020-08-07 ENCOUNTER — Ambulatory Visit
Admission: RE | Admit: 2020-08-07 | Discharge: 2020-08-07 | Disposition: A | Discharge status: Home / Self Care | Payer: Medicare PPO | Source: Ambulatory Visit | Attending: Physician Assistant | Admitting: Physician Assistant

## 2020-08-07 ENCOUNTER — Other Ambulatory Visit: Payer: Self-pay

## 2020-08-07 DIAGNOSIS — Z1231 Encounter for screening mammogram for malignant neoplasm of breast: Secondary | ICD-10-CM

## 2020-08-24 ENCOUNTER — Other Ambulatory Visit: Payer: Self-pay | Admitting: Cardiovascular Disease

## 2020-09-03 ENCOUNTER — Other Ambulatory Visit: Payer: Self-pay | Admitting: Cardiovascular Disease

## 2020-09-17 ENCOUNTER — Telehealth: Payer: Self-pay | Admitting: Cardiovascular Disease

## 2020-09-17 DIAGNOSIS — I10 Essential (primary) hypertension: Secondary | ICD-10-CM

## 2020-09-21 NOTE — Telephone Encounter (Signed)
Sent in 15 days refill with no refills, patient is over due for appointment. She has not been seen since 01/2019. Requested patient to call to make an appointment.

## 2020-09-25 MED ORDER — AMLODIPINE BESYLATE 10 MG PO TABS: 10.0000 mg | ORAL_TABLET | Freq: Every day | ORAL | 0 refills | Status: DC

## 2020-09-25 NOTE — Telephone Encounter (Signed)
*  STAT* If patient is at the pharmacy, call can be transferred to refill team.   1. Which medications need to be refilled? (please list name of each medication and dose if known) Amlodipine- there are no appointments avaialable  until 03-08-21  2. Which pharmacy/location (including street and city if local pharmacy) is medication to be sent to? [facility]- [place]  3. Do they need a 30 day or 90 day supply? Enough until her appointment on 03-08-21

## 2020-12-07 ENCOUNTER — Other Ambulatory Visit: Payer: Self-pay | Admitting: Cardiovascular Disease

## 2020-12-07 DIAGNOSIS — I10 Essential (primary) hypertension: Secondary | ICD-10-CM

## 2021-01-13 ENCOUNTER — Other Ambulatory Visit: Payer: Self-pay

## 2021-01-13 ENCOUNTER — Encounter: Payer: Self-pay | Admitting: Dermatology

## 2021-01-13 ENCOUNTER — Ambulatory Visit: Payer: Medicare PPO | Admitting: Dermatology

## 2021-01-13 DIAGNOSIS — D692 Other nonthrombocytopenic purpura: Secondary | ICD-10-CM | POA: Diagnosis not present

## 2021-01-13 DIAGNOSIS — Z1283 Encounter for screening for malignant neoplasm of skin: Secondary | ICD-10-CM | POA: Diagnosis not present

## 2021-01-13 DIAGNOSIS — C4442 Squamous cell carcinoma of skin of scalp and neck: Secondary | ICD-10-CM

## 2021-01-13 DIAGNOSIS — D485 Neoplasm of uncertain behavior of skin: Secondary | ICD-10-CM

## 2021-01-13 DIAGNOSIS — C44612 Basal cell carcinoma of skin of right upper limb, including shoulder: Secondary | ICD-10-CM

## 2021-01-13 DIAGNOSIS — L570 Actinic keratosis: Secondary | ICD-10-CM

## 2021-01-13 NOTE — Patient Instructions (Signed)
  Over the Counter- Dermend for arms to help with bruising   Biopsy, Surgery (Curettage) & Surgery (Excision) Aftercare Instructions  1. Okay to remove bandage in 24 hours  2. Wash area with soap and water  3. Apply Vaseline to area twice daily until healed (Not Neosporin)  4. Okay to cover with a Band-Aid to decrease the chance of infection or prevent irritation from clothing; also it's okay to uncover lesion at home.  5. Suture instructions: return to our office in 7-10 or 10-14 days for a nurse visit for suture removal. Variable healing with sutures, if pain or itching occurs call our office. It's okay to shower or bathe 24 hours after sutures are given.  6. The following risks may occur after a biopsy, curettage or excision: bleeding, scarring, discoloration, recurrence, infection (redness, yellow drainage, pain or swelling).  7. For questions, concerns and results call our office at [place] before 4pm & Friday before 3pm. Biopsy results will be available in 1 week.

## 2021-01-18 ENCOUNTER — Telehealth: Payer: Self-pay | Admitting: *Deleted

## 2021-01-18 NOTE — Telephone Encounter (Signed)
Left patient message to call back for results.  

## 2021-01-18 NOTE — Telephone Encounter (Signed)
-----   Message from Lavonna Monarch, MD sent at 01/16/2021  3:00 AM EST ----- Schedule surgery with Dr. Darene Lamer

## 2021-01-21 NOTE — Telephone Encounter (Signed)
-----   Message from Lavonna Monarch, MD sent at 01/16/2021  3:00 AM EST ----- Schedule surgery with Dr. Darene Lamer

## 2021-01-21 NOTE — Telephone Encounter (Signed)
Path to patient and surgery made for 2/2

## 2021-01-31 ENCOUNTER — Encounter: Payer: Self-pay | Admitting: Dermatology

## 2021-01-31 NOTE — Progress Notes (Signed)
° °  Follow-Up Visit   Subjective  Stacy Morris is a 87 y.o. female who presents for the following: Annual Exam (Right temple, right inner eye, left neck, right upper arm- wont heal & back).  Annual skin check, several spots of concern with history of growth Location:  Duration:  Quality:  Associated Signs/Symptoms: Modifying Factors:  Severity:  Timing: Context:   Objective  Well appearing patient in no apparent distress; mood and affect are within normal limits. Multiple 3 to 8 mm pink crust which are likely a combination of actinic keratoses plus possible superficial carcinomas.  Right Upper Arm - Anterior 1.8 cm hemorrhagic crust       Left Neck 2.3 cm waxy crust       Left Forearm - Anterior, Right Forearm - Anterior Multiple 6 to 15 mm ecchymoses dorsal forearms/hands.  Denies other abnormal bleeding.      Follow-Up Visit   Subjective  Stacy Morris is a 87 y.o. female who presents for the following: Annual Exam (Right temple, right inner eye, left neck, right upper arm- wont heal & back).  Assessment & Plan  Also exposed areas plus back examined  Neoplasm of uncertain behavior of skin (2) Right Upper Arm - Anterior  Skin / nail biopsy Type of biopsy: tangential   Informed consent: discussed and consent obtained   Timeout: patient name, date of birth, surgical site, and procedure verified   Procedure prep:  Patient was prepped and draped in usual sterile fashion (Non sterile) Prep type:  Chlorhexidine Anesthesia: the lesion was anesthetized in a standard fashion   Anesthetic:  1% lidocaine w/ epinephrine 1-100,000 local infiltration Instrument used: flexible razor blade   Outcome: patient tolerated procedure well   Post-procedure details: wound care instructions given    Specimen 1 - Surgical pathology Differential Diagnosis: bcc vs scc  Check Margins: No  Left Neck  Skin / nail biopsy Type of biopsy: tangential   Informed consent:  discussed and consent obtained   Timeout: patient name, date of birth, surgical site, and procedure verified   Procedure prep:  Patient was prepped and draped in usual sterile fashion (Non sterile) Prep type:  Chlorhexidine Anesthesia: the lesion was anesthetized in a standard fashion   Anesthetic:  1% lidocaine w/ epinephrine 1-100,000 local infiltration Instrument used: flexible razor blade   Outcome: patient tolerated procedure well   Post-procedure details: wound care instructions given    Specimen 2 - Surgical pathology Differential Diagnosis: bcc vs scc (2 specimens)  Check Margins: No  Solar purpura ([place]) (2) Left Forearm - Anterior; Right Forearm - Anterior  Otc dermend  AK (actinic keratosis)  Defer intervention if stable      I, Lavonna Monarch, MD, have reviewed all documentation for this visit.  The documentation on 01/31/21 for the exam, diagnosis, procedures, and orders are all accurate and complete.
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
## 2021-03-08 ENCOUNTER — Ambulatory Visit: Payer: Medicare PPO | Admitting: Cardiovascular Disease

## 2021-03-08 ENCOUNTER — Other Ambulatory Visit: Payer: Self-pay

## 2021-03-08 ENCOUNTER — Encounter: Payer: Self-pay | Admitting: Cardiovascular Disease

## 2021-03-08 VITALS — BP 144/64 | HR 57 | Ht 67.0 in | Wt 148.0 lb

## 2021-03-08 DIAGNOSIS — I10 Essential (primary) hypertension: Secondary | ICD-10-CM

## 2021-03-08 DIAGNOSIS — I2510 Atherosclerotic heart disease of native coronary artery without angina pectoris: Secondary | ICD-10-CM

## 2021-03-08 DIAGNOSIS — N184 Chronic kidney disease, stage 4 (severe): Secondary | ICD-10-CM | POA: Diagnosis not present

## 2021-03-08 NOTE — Patient Instructions (Addendum)
Medication Instructions:  *If you need a refill on your cardiac medications before your next appointment, please call your pharmacy*  Lab Work: If you have labs (blood work) drawn today and your tests are completely normal, you will receive your results only by: [place] (if you have MyChart) OR A paper copy in the mail If you have any lab test that is abnormal or we need to change your treatment, we will call you to review the results.  Follow-Up: At [facility], you and your health needs are our priority.  As part of our continuing mission to provide you with exceptional heart care, we have created designated Provider Care Teams.  These Care Teams include your primary Cardiologist (physician) and Advanced Practice Providers (APPs -  Physician Assistants and Nurse Practitioners) who all work together to provide you with the care you need, when you need it.  We recommend signing up for the patient portal called "MyChart".  Sign up information is provided on this After Visit Summary.  MyChart is used to connect with patients for Virtual Visits (Telemedicine).  Patients are able to view lab/test results, encounter notes, upcoming appointments, etc.  Non-urgent messages can be sent to your provider as well.   To learn more about what you can do with MyChart, go to [email or web address].    Your next appointment:   As needed  The format for your next appointment:   In Person  Provider:   Dr. Johnsie Cancel

## 2021-03-18 ENCOUNTER — Other Ambulatory Visit: Payer: Self-pay

## 2021-03-18 ENCOUNTER — Ambulatory Visit (INDEPENDENT_AMBULATORY_CARE_PROVIDER_SITE_OTHER): Payer: Medicare PPO | Admitting: Dermatology

## 2021-03-18 DIAGNOSIS — L821 Other seborrheic keratosis: Secondary | ICD-10-CM

## 2021-03-18 DIAGNOSIS — D044 Carcinoma in situ of skin of scalp and neck: Secondary | ICD-10-CM | POA: Diagnosis not present

## 2021-03-18 DIAGNOSIS — L720 Epidermal cyst: Secondary | ICD-10-CM

## 2021-03-18 NOTE — Patient Instructions (Signed)

## 2021-03-23 ENCOUNTER — Other Ambulatory Visit: Payer: Self-pay

## 2021-03-23 ENCOUNTER — Encounter (HOSPITAL_COMMUNITY)
Admission: RE | Admit: 2021-03-23 | Discharge: 2021-03-23 | Disposition: A | Discharge status: Home / Self Care | Payer: Medicare PPO | Source: Ambulatory Visit | Attending: Nephrology | Admitting: Nephrology

## 2021-03-23 VITALS — BP 138/64 | HR 83 | Temp 97.4°F

## 2021-03-23 DIAGNOSIS — N184 Chronic kidney disease, stage 4 (severe): Secondary | ICD-10-CM | POA: Diagnosis present

## 2021-03-23 LAB — POCT HEMOGLOBIN-HEMACUE: Hemoglobin: 9.8 g/dL — ABNORMAL LOW (ref 12.0–15.0)

## 2021-03-23 MED ORDER — EPOETIN ALFA-EPBX 10000 UNIT/ML IJ SOLN
20000.0000 [IU] | INTRAMUSCULAR | Status: DC
  Administered 2021-03-23: 20000 [IU] via SUBCUTANEOUS

## 2021-03-23 MED ORDER — EPOETIN ALFA-EPBX 10000 UNIT/ML IJ SOLN
INTRAMUSCULAR | Status: AC
  Filled 2021-03-23: qty 2

## 2021-04-10 ENCOUNTER — Encounter: Payer: Self-pay | Admitting: Dermatology

## 2021-04-10 NOTE — Progress Notes (Signed)
° °  Follow-Up Visit   Subjective  Stacy Morris is a 88 y.o. female who presents for the following: Procedure (Patient here today for treatment of BCC x 1 right upper arm - anterior and CIS x 1 left neck).  Biopsy-proven skin cancers, would like several spots checked on face Location:  Duration:  Quality:  Associated Signs/Symptoms: Modifying Factors:  Severity:  Timing: Context:   Objective  Well appearing patient in no apparent distress; mood and affect are within normal limits. Left Neck Lesion identified by Dr.Breon Rehm and nurse in room.    Left Malar Cheek, Left Zygomatic Area, Right Malar Cheek, Right Zygomatic Area Upper dermal half millimeter white papule  Right Temple Tan textured 5 mm flattopped papule    A focused examination was performed including head, neck, arms. Relevant physical exam findings are noted in the Assessment and Plan.   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  Destruction of lesion Complexity: simple   Destruction method: electrodesiccation and curettage   Informed consent: discussed and consent obtained   Timeout:  patient name, date of birth, surgical site, and procedure verified Anesthesia: the lesion was anesthetized in a standard fashion   Anesthetic:  1% lidocaine w/ epinephrine 1-100,000 local infiltration Curettage performed in three different directions: Yes   Electrodesiccation performed over the curetted area: Yes   Curettage cycles:  3 Lesion length (cm):  3.5 Lesion width (cm):  3.5 Margin per side (cm):  0 Final wound size (cm):  3.5 Hemostasis achieved with:  ferric subsulfate Outcome: patient tolerated procedure well with no complications   Post-procedure details: sterile dressing applied and wound care instructions given   Dressing type: bandage and petrolatum   Additional details:  Wound inoculated with 5% Fluorouracil.    Curettage showed this to be a very wide  lesion with little depth.  [place] (4) Left Zygomatic Area; Right Zygomatic Area; Left Malar Cheek; Right Malar Cheek  No intervention  Seborrheic keratosis Right Temple  No intervention      I, Lavonna Monarch, MD, have reviewed all documentation for this visit.  The documentation on 04/10/21 for the exam, diagnosis, procedures, and orders are all accurate and complete.

## 2021-04-20 ENCOUNTER — Ambulatory Visit (HOSPITAL_COMMUNITY)
Admission: RE | Admit: 2021-04-20 | Discharge: 2021-04-20 | Disposition: A | Discharge status: Home / Self Care | Payer: Medicare PPO | Source: Ambulatory Visit | Attending: Nephrology | Admitting: Nephrology

## 2021-04-20 ENCOUNTER — Other Ambulatory Visit: Payer: Self-pay

## 2021-04-20 VITALS — BP 118/51 | HR 71 | Temp 96.8°F

## 2021-04-20 DIAGNOSIS — N184 Chronic kidney disease, stage 4 (severe): Secondary | ICD-10-CM | POA: Insufficient documentation

## 2021-04-20 LAB — RENAL FUNCTION PANEL
Albumin: 3.5 g/dL (ref 3.5–5.0)
Anion gap: 8 (ref 5–15)
BUN: 50 mg/dL — ABNORMAL HIGH (ref 8–23)
CO2: 21 mmol/L — ABNORMAL LOW (ref 22–32)
Calcium: 10.3 mg/dL (ref 8.9–10.3)
Chloride: 109 mmol/L (ref 98–111)
Creatinine, Ser: 4.13 mg/dL — ABNORMAL HIGH (ref 0.44–1.00)
GFR, Estimated: 10 mL/min — ABNORMAL LOW (ref >60)
Glucose, Bld: 108 mg/dL — ABNORMAL HIGH (ref 70–99)
Phosphorus: 3.8 mg/dL (ref 2.5–4.6)
Potassium: 4.4 mmol/L (ref 3.5–5.1)
Sodium: 138 mmol/L (ref 135–145)

## 2021-04-20 LAB — CBC WITH DIFFERENTIAL/PLATELET
Abs Immature Granulocytes: 0.04 10*3/uL (ref 0.00–0.07)
Basophils Absolute: 0.1 10*3/uL (ref 0.0–0.1)
Basophils Relative: 1 %
Eosinophils Absolute: 0.1 10*3/uL (ref 0.0–0.5)
Eosinophils Relative: 1 %
HCT: 32.5 % — ABNORMAL LOW (ref 36.0–46.0)
Hemoglobin: 10 g/dL — ABNORMAL LOW (ref 12.0–15.0)
Immature Granulocytes: 0 %
Lymphocytes Relative: 17 %
Lymphs Abs: 1.9 10*3/uL (ref 0.7–4.0)
MCH: 32.1 pg (ref 26.0–34.0)
MCHC: 30.8 g/dL (ref 30.0–36.0)
MCV: 104.2 fL — ABNORMAL HIGH (ref 80.0–100.0)
Monocytes Absolute: 0.6 10*3/uL (ref 0.1–1.0)
Monocytes Relative: 5 %
Neutro Abs: 8.4 10*3/uL — ABNORMAL HIGH (ref 1.7–7.7)
Neutrophils Relative %: 76 %
Platelets: 241 10*3/uL (ref 150–400)
RBC: 3.12 MIL/uL — ABNORMAL LOW (ref 3.87–5.11)
RDW: 15.4 % (ref 11.5–15.5)
WBC: 11 10*3/uL — ABNORMAL HIGH (ref 4.0–10.5)
nRBC: 0 % (ref 0.0–0.2)

## 2021-04-20 LAB — POCT HEMOGLOBIN-HEMACUE: Hemoglobin: 10.4 g/dL — ABNORMAL LOW (ref 12.0–15.0)

## 2021-04-20 MED ORDER — EPOETIN ALFA-EPBX 10000 UNIT/ML IJ SOLN
INTRAMUSCULAR | Status: AC
  Filled 2021-04-20: qty 2

## 2021-04-20 MED ORDER — EPOETIN ALFA-EPBX 10000 UNIT/ML IJ SOLN
20000.0000 [IU] | INTRAMUSCULAR | Status: DC
  Administered 2021-04-20: 20000 [IU] via SUBCUTANEOUS

## 2021-05-18 ENCOUNTER — Encounter (HOSPITAL_COMMUNITY): Payer: Medicare PPO

## 2021-05-18 ENCOUNTER — Encounter (HOSPITAL_COMMUNITY)
Admission: RE | Admit: 2021-05-18 | Discharge: 2021-05-18 | Disposition: A | Discharge status: Home / Self Care | Payer: Medicare PPO | Source: Ambulatory Visit | Attending: Nephrology | Admitting: Nephrology

## 2021-05-18 VITALS — BP 122/66 | HR 73 | Temp 96.6°F

## 2021-05-18 DIAGNOSIS — N184 Chronic kidney disease, stage 4 (severe): Secondary | ICD-10-CM | POA: Diagnosis not present

## 2021-05-18 LAB — RENAL FUNCTION PANEL
Albumin: 3.5 g/dL (ref 3.5–5.0)
Anion gap: 10 (ref 5–15)
BUN: 49 mg/dL — ABNORMAL HIGH (ref 8–23)
CO2: 20 mmol/L — ABNORMAL LOW (ref 22–32)
Calcium: 10 mg/dL (ref 8.9–10.3)
Chloride: 109 mmol/L (ref 98–111)
Creatinine, Ser: 4.07 mg/dL — ABNORMAL HIGH (ref 0.44–1.00)
GFR, Estimated: 10 mL/min — ABNORMAL LOW (ref >60)
Glucose, Bld: 114 mg/dL — ABNORMAL HIGH (ref 70–99)
Phosphorus: 4 mg/dL (ref 2.5–4.6)
Potassium: 4 mmol/L (ref 3.5–5.1)
Sodium: 139 mmol/L (ref 135–145)

## 2021-05-18 LAB — CBC WITH DIFFERENTIAL/PLATELET
Abs Immature Granulocytes: 0.06 10*3/uL (ref 0.00–0.07)
Basophils Absolute: 0 10*3/uL (ref 0.0–0.1)
Basophils Relative: 0 %
Eosinophils Absolute: 0.1 10*3/uL (ref 0.0–0.5)
Eosinophils Relative: 1 %
HCT: 31.8 % — ABNORMAL LOW (ref 36.0–46.0)
Hemoglobin: 10 g/dL — ABNORMAL LOW (ref 12.0–15.0)
Immature Granulocytes: 1 %
Lymphocytes Relative: 18 %
Lymphs Abs: 2 10*3/uL (ref 0.7–4.0)
MCH: 32.4 pg (ref 26.0–34.0)
MCHC: 31.4 g/dL (ref 30.0–36.0)
MCV: 102.9 fL — ABNORMAL HIGH (ref 80.0–100.0)
Monocytes Absolute: 0.9 10*3/uL (ref 0.1–1.0)
Monocytes Relative: 8 %
Neutro Abs: 8.3 10*3/uL — ABNORMAL HIGH (ref 1.7–7.7)
Neutrophils Relative %: 72 %
Platelets: 255 10*3/uL (ref 150–400)
RBC: 3.09 MIL/uL — ABNORMAL LOW (ref 3.87–5.11)
RDW: 15.3 % (ref 11.5–15.5)
WBC: 11.4 10*3/uL — ABNORMAL HIGH (ref 4.0–10.5)
nRBC: 0 % (ref 0.0–0.2)

## 2021-05-18 LAB — IRON AND TIBC
Iron: 71 ug/dL (ref 28–170)
Saturation Ratios: 25 % (ref 10.4–31.8)
TIBC: 288 ug/dL (ref 250–450)
UIBC: 217 ug/dL

## 2021-05-18 LAB — POCT HEMOGLOBIN-HEMACUE: Hemoglobin: 10.3 g/dL — ABNORMAL LOW (ref 12.0–15.0)

## 2021-05-18 LAB — FERRITIN: Ferritin: 234 ng/mL (ref 11–307)

## 2021-05-18 MED ORDER — EPOETIN ALFA-EPBX 10000 UNIT/ML IJ SOLN
INTRAMUSCULAR | Status: AC
  Filled 2021-05-18: qty 2

## 2021-05-18 MED ORDER — EPOETIN ALFA-EPBX 10000 UNIT/ML IJ SOLN
20000.0000 [IU] | INTRAMUSCULAR | Status: DC
  Administered 2021-05-18: 20000 [IU] via SUBCUTANEOUS

## 2021-05-19 ENCOUNTER — Ambulatory Visit: Payer: Medicare PPO | Admitting: Dermatology

## 2021-06-02 ENCOUNTER — Ambulatory Visit: Payer: Medicare PPO | Admitting: Dermatology

## 2021-06-02 ENCOUNTER — Encounter: Payer: Self-pay | Admitting: Dermatology

## 2021-06-02 DIAGNOSIS — Z86007 Personal history of in-situ neoplasm of skin: Secondary | ICD-10-CM

## 2021-06-02 DIAGNOSIS — D0462 Carcinoma in situ of skin of left upper limb, including shoulder: Secondary | ICD-10-CM

## 2021-06-02 DIAGNOSIS — L570 Actinic keratosis: Secondary | ICD-10-CM

## 2021-06-02 DIAGNOSIS — D485 Neoplasm of uncertain behavior of skin: Secondary | ICD-10-CM

## 2021-06-02 DIAGNOSIS — C44629 Squamous cell carcinoma of skin of left upper limb, including shoulder: Secondary | ICD-10-CM

## 2021-06-02 NOTE — Patient Instructions (Signed)

## 2021-06-15 ENCOUNTER — Encounter (HOSPITAL_COMMUNITY)
Admission: RE | Admit: 2021-06-15 | Discharge: 2021-06-15 | Disposition: A | Discharge status: Home / Self Care | Payer: Medicare PPO | Source: Ambulatory Visit | Attending: Nephrology | Admitting: Nephrology

## 2021-06-15 VITALS — BP 119/64 | HR 82

## 2021-06-15 DIAGNOSIS — N184 Chronic kidney disease, stage 4 (severe): Secondary | ICD-10-CM | POA: Insufficient documentation

## 2021-06-15 LAB — CBC WITH DIFFERENTIAL/PLATELET
Abs Immature Granulocytes: 0.03 10*3/uL (ref 0.00–0.07)
Basophils Absolute: 0 10*3/uL (ref 0.0–0.1)
Basophils Relative: 0 %
Eosinophils Absolute: 0.1 10*3/uL (ref 0.0–0.5)
Eosinophils Relative: 1 %
HCT: 34.3 % — ABNORMAL LOW (ref 36.0–46.0)
Hemoglobin: 11 g/dL — ABNORMAL LOW (ref 12.0–15.0)
Immature Granulocytes: 0 %
Lymphocytes Relative: 19 %
Lymphs Abs: 1.8 10*3/uL (ref 0.7–4.0)
MCH: 32.5 pg (ref 26.0–34.0)
MCHC: 32.1 g/dL (ref 30.0–36.0)
MCV: 101.5 fL — ABNORMAL HIGH (ref 80.0–100.0)
Monocytes Absolute: 0.4 10*3/uL (ref 0.1–1.0)
Monocytes Relative: 4 %
Neutro Abs: 7.1 10*3/uL (ref 1.7–7.7)
Neutrophils Relative %: 76 %
Platelets: 238 10*3/uL (ref 150–400)
RBC: 3.38 MIL/uL — ABNORMAL LOW (ref 3.87–5.11)
RDW: 15.1 % (ref 11.5–15.5)
WBC: 9.4 10*3/uL (ref 4.0–10.5)
nRBC: 0 % (ref 0.0–0.2)

## 2021-06-15 LAB — POCT HEMOGLOBIN-HEMACUE: Hemoglobin: 10.8 g/dL — ABNORMAL LOW (ref 12.0–15.0)

## 2021-06-15 LAB — RENAL FUNCTION PANEL
Albumin: 3.5 g/dL (ref 3.5–5.0)
Anion gap: 12 (ref 5–15)
BUN: 45 mg/dL — ABNORMAL HIGH (ref 8–23)
CO2: 20 mmol/L — ABNORMAL LOW (ref 22–32)
Calcium: 10.6 mg/dL — ABNORMAL HIGH (ref 8.9–10.3)
Chloride: 110 mmol/L (ref 98–111)
Creatinine, Ser: 4.1 mg/dL — ABNORMAL HIGH (ref 0.44–1.00)
GFR, Estimated: 10 mL/min — ABNORMAL LOW (ref >60)
Glucose, Bld: 115 mg/dL — ABNORMAL HIGH (ref 70–99)
Phosphorus: 4.9 mg/dL — ABNORMAL HIGH (ref 2.5–4.6)
Potassium: 4.2 mmol/L (ref 3.5–5.1)
Sodium: 142 mmol/L (ref 135–145)

## 2021-06-15 MED ORDER — EPOETIN ALFA-EPBX 10000 UNIT/ML IJ SOLN: 20000.0000 [IU] | INTRAMUSCULAR | Status: DC

## 2021-06-15 MED ORDER — EPOETIN ALFA-EPBX 10000 UNIT/ML IJ SOLN
INTRAMUSCULAR | Status: AC
  Administered 2021-06-15: 20000 [IU] via SUBCUTANEOUS
  Filled 2021-06-15: qty 2

## 2021-06-20 ENCOUNTER — Encounter: Payer: Self-pay | Admitting: Dermatology

## 2021-06-20 NOTE — Progress Notes (Signed)
? ?  Follow-Up Visit ?  ?Subjective  ?Stacy Morris is a 88 y.o. female who presents for the following: Skin Problem (Lesion on left arm x 3 months. 2 lesion on left upper arm x 3 months. Lesion on forehead x 3 years. Lesion on left temple and right temple x 2 weeks. ). ? ?Several new crusts that patient has noticed ?Location:  ?Duration:  ?Quality:  ?Associated Signs/Symptoms: ?Modifying Factors:  ?Severity:  ?Timing: ?Context:  ? ?Objective  ?Well appearing patient in no apparent distress; mood and affect are within normal limits. ?Neck - Anterior ?No sign recurrence ? ?Left Upper Arm - Posterior (5), Right Temporal Scalp (3) ?Hornlike pink 2 to 4 mm crusts ? ?Left Forearm - Posterior ?1 cm waxy, verrucous crust ? ? ? ? ? ? ?A focused examination was performed including head, neck, arms. Relevant physical exam findings are noted in the Assessment and Plan. ? ? ?Assessment & Plan  ? ? ?History of squamous cell carcinoma in situ ?Neck - Anterior ? ?Check as needed change ? ?AK (actinic keratosis) (8) ?Left Upper Arm - Posterior (5); Right Temporal Scalp (3) ? ?Destruction of lesion - Left Upper Arm - Posterior, Right Temporal Scalp ?Complexity: simple   ?Destruction method: cryotherapy   ?Informed consent: discussed and consent obtained   ?Lesion destroyed using liquid nitrogen: Yes   ?Cryotherapy cycles:  3 ?Outcome: patient tolerated procedure well with no complications   ? ?Squamous cell carcinoma of arm, left ?Left Forearm - Posterior ? ?Skin / nail biopsy ?Type of biopsy: tangential   ?Informed consent: discussed and consent obtained   ?Timeout: patient name, date of birth, surgical site, and procedure verified   ?Anesthesia: the lesion was anesthetized in a standard fashion   ?Anesthetic:  1% lidocaine w/ epinephrine 1-100,000 local infiltration ?Instrument used: flexible razor blade   ?Hemostasis achieved with: aluminum chloride and electrodesiccation   ?Outcome: patient tolerated procedure well    ?Post-procedure details: wound care instructions given   ? ?Destruction of lesion ?Complexity: simple   ?Destruction method: electrodesiccation and curettage   ?Informed consent: discussed and consent obtained   ?Timeout:  patient name, date of birth, surgical site, and procedure verified ?Anesthesia: the lesion was anesthetized in a standard fashion   ?Anesthetic:  1% lidocaine w/ epinephrine 1-100,000 local infiltration ?Curettage performed in three different directions: Yes   ?Electrodesiccation performed over the curetted area: Yes   ?Curettage cycles:  3 ?Lesion length (cm):  1.1 ?Lesion width (cm):  1.1 ?Margin per side (cm):  0 ?Final wound size (cm):  1.1 ?Hemostasis achieved with:  aluminum chloride ?Outcome: patient tolerated procedure well with no complications   ?Post-procedure details: wound care instructions given   ? ?Specimen 1 - Surgical pathology ?Differential Diagnosis: bcc vs scc - txpbx ? ?Check Margins: No ? ?After shave biopsy the deeper keratin was treated with curettage plus cautery. ? ? ? ? ? ?I, Lavonna Monarch, MD, have reviewed all documentation for this visit.  The documentation on 06/20/21 for the exam, diagnosis, procedures, and orders are all accurate and complete. ?

## 2021-07-13 ENCOUNTER — Ambulatory Visit (HOSPITAL_COMMUNITY)
Admission: RE | Admit: 2021-07-13 | Discharge: 2021-07-13 | Disposition: A | Discharge status: Home / Self Care | Payer: Medicare PPO | Source: Ambulatory Visit | Attending: Nephrology | Admitting: Nephrology

## 2021-07-13 VITALS — BP 133/68 | HR 71

## 2021-07-13 DIAGNOSIS — N184 Chronic kidney disease, stage 4 (severe): Secondary | ICD-10-CM | POA: Diagnosis not present

## 2021-07-13 LAB — POCT HEMOGLOBIN-HEMACUE: Hemoglobin: 11.2 g/dL — ABNORMAL LOW (ref 12.0–15.0)

## 2021-07-13 MED ORDER — EPOETIN ALFA-EPBX 10000 UNIT/ML IJ SOLN
20000.0000 [IU] | INTRAMUSCULAR | Status: DC
  Administered 2021-07-13: 20000 [IU] via SUBCUTANEOUS

## 2021-07-13 MED ORDER — EPOETIN ALFA-EPBX 10000 UNIT/ML IJ SOLN
INTRAMUSCULAR | Status: AC
  Filled 2021-07-13: qty 2

## 2021-08-09 ENCOUNTER — Ambulatory Visit (HOSPITAL_COMMUNITY)
Admission: RE | Admit: 2021-08-09 | Discharge: 2021-08-09 | Disposition: A | Discharge status: Home / Self Care | Payer: Medicare PPO | Source: Ambulatory Visit | Attending: Nephrology | Admitting: Nephrology

## 2021-08-09 VITALS — BP 133/70 | HR 87

## 2021-08-09 DIAGNOSIS — N184 Chronic kidney disease, stage 4 (severe): Secondary | ICD-10-CM | POA: Insufficient documentation

## 2021-08-09 LAB — IRON AND TIBC
Iron: 64 ug/dL (ref 28–170)
Saturation Ratios: 22 % (ref 10.4–31.8)
TIBC: 288 ug/dL (ref 250–450)
UIBC: 224 ug/dL

## 2021-08-09 LAB — CBC WITH DIFFERENTIAL/PLATELET
Abs Immature Granulocytes: 0.24 10*3/uL — ABNORMAL HIGH (ref 0.00–0.07)
Basophils Absolute: 0 10*3/uL (ref 0.0–0.1)
Basophils Relative: 0 %
Eosinophils Absolute: 0.1 10*3/uL (ref 0.0–0.5)
Eosinophils Relative: 0 %
HCT: 35.2 % — ABNORMAL LOW (ref 36.0–46.0)
Hemoglobin: 10.8 g/dL — ABNORMAL LOW (ref 12.0–15.0)
Immature Granulocytes: 2 %
Lymphocytes Relative: 10 %
Lymphs Abs: 1.5 10*3/uL (ref 0.7–4.0)
MCH: 31.5 pg (ref 26.0–34.0)
MCHC: 30.7 g/dL (ref 30.0–36.0)
MCV: 102.6 fL — ABNORMAL HIGH (ref 80.0–100.0)
Monocytes Absolute: 1 10*3/uL (ref 0.1–1.0)
Monocytes Relative: 7 %
Neutro Abs: 11.4 10*3/uL — ABNORMAL HIGH (ref 1.7–7.7)
Neutrophils Relative %: 81 %
Platelets: 282 10*3/uL (ref 150–400)
RBC: 3.43 MIL/uL — ABNORMAL LOW (ref 3.87–5.11)
RDW: 16.4 % — ABNORMAL HIGH (ref 11.5–15.5)
WBC: 14.2 10*3/uL — ABNORMAL HIGH (ref 4.0–10.5)
nRBC: 0 % (ref 0.0–0.2)

## 2021-08-09 LAB — RENAL FUNCTION PANEL
Albumin: 3.4 g/dL — ABNORMAL LOW (ref 3.5–5.0)
Anion gap: 11 (ref 5–15)
BUN: 74 mg/dL — ABNORMAL HIGH (ref 8–23)
CO2: 19 mmol/L — ABNORMAL LOW (ref 22–32)
Calcium: 10 mg/dL (ref 8.9–10.3)
Chloride: 110 mmol/L (ref 98–111)
Creatinine, Ser: 4.53 mg/dL — ABNORMAL HIGH (ref 0.44–1.00)
GFR, Estimated: 9 mL/min — ABNORMAL LOW (ref >60)
Glucose, Bld: 102 mg/dL — ABNORMAL HIGH (ref 70–99)
Phosphorus: 5 mg/dL — ABNORMAL HIGH (ref 2.5–4.6)
Potassium: 4 mmol/L (ref 3.5–5.1)
Sodium: 140 mmol/L (ref 135–145)

## 2021-08-09 LAB — FERRITIN: Ferritin: 335 ng/mL — ABNORMAL HIGH (ref 11–307)

## 2021-08-09 LAB — POCT HEMOGLOBIN-HEMACUE: Hemoglobin: 11.3 g/dL — ABNORMAL LOW (ref 12.0–15.0)

## 2021-08-09 MED ORDER — EPOETIN ALFA-EPBX 10000 UNIT/ML IJ SOLN
20000.0000 [IU] | INTRAMUSCULAR | Status: DC
  Administered 2021-08-09: 20000 [IU] via SUBCUTANEOUS

## 2021-08-09 MED ORDER — EPOETIN ALFA-EPBX 10000 UNIT/ML IJ SOLN
INTRAMUSCULAR | Status: AC
  Filled 2021-08-09: qty 2

## 2021-09-06 ENCOUNTER — Encounter (HOSPITAL_COMMUNITY): Payer: Medicare PPO

## 2021-09-14 DEATH — deceased

## 2021-10-04 ENCOUNTER — Encounter (HOSPITAL_COMMUNITY): Payer: Medicare PPO

## 2023-01-24 IMAGING — MG MM DIGITAL SCREENING BILAT W/ TOMO AND CAD
8 series · 8 of 24 positions shown · non-contrast
Comparison: Previous exam(s).

CLINICAL DATA: Screening.

EXAM:
DIGITAL SCREENING BILATERAL MAMMOGRAM WITH TOMOSYNTHESIS AND CAD
TECHNIQUE: Bilateral screening digital craniocaudal and mediolateral oblique
mammograms were obtained. Bilateral screening digital breast
tomosynthesis was performed. The images were evaluated with
computer-aided detection.

[L MLO synth-2D]
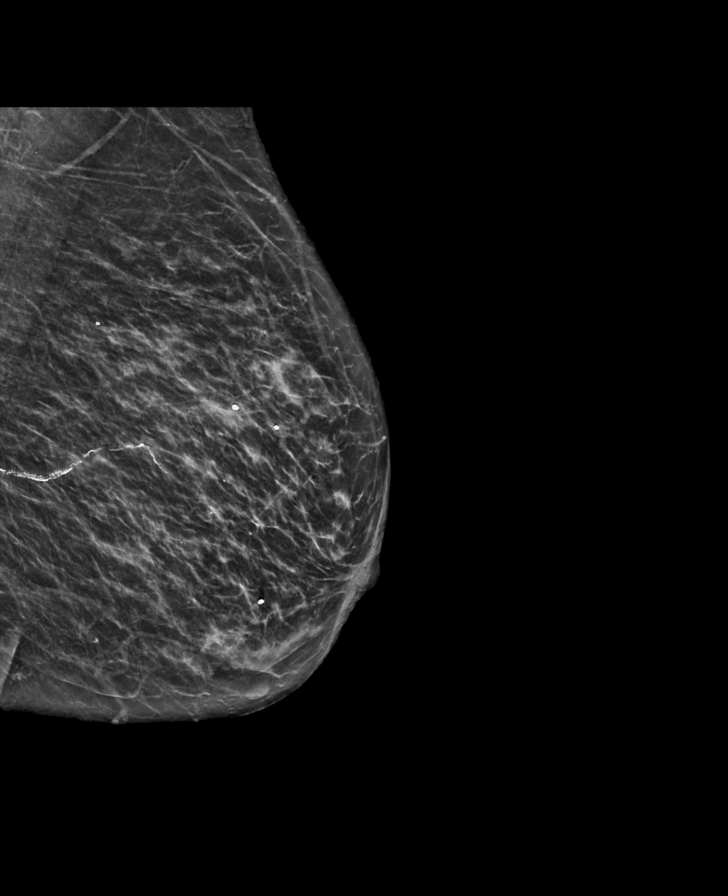

[R CC synth-2D]
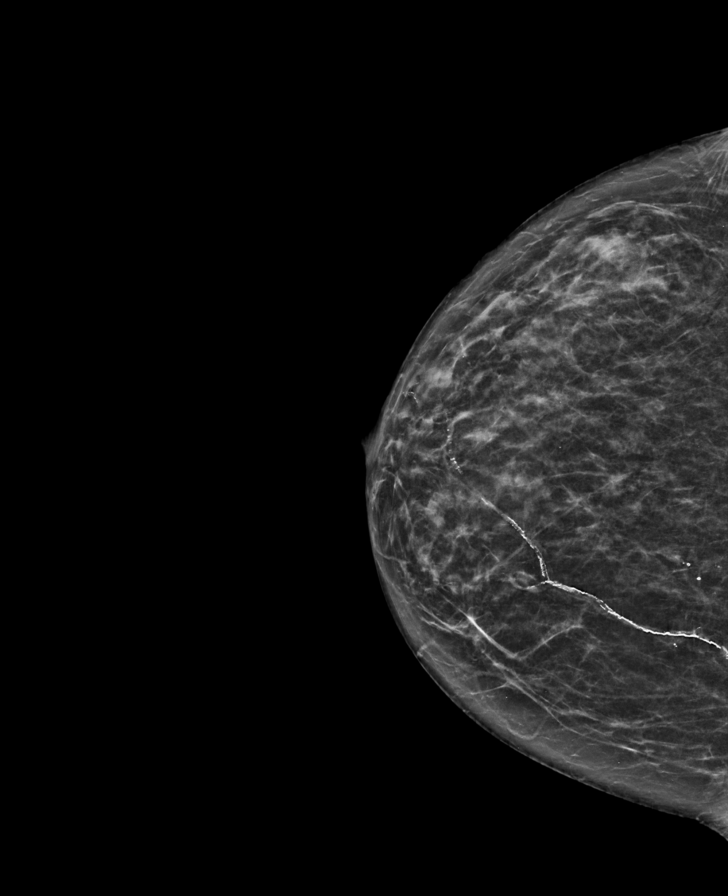

[L CC synth-2D]
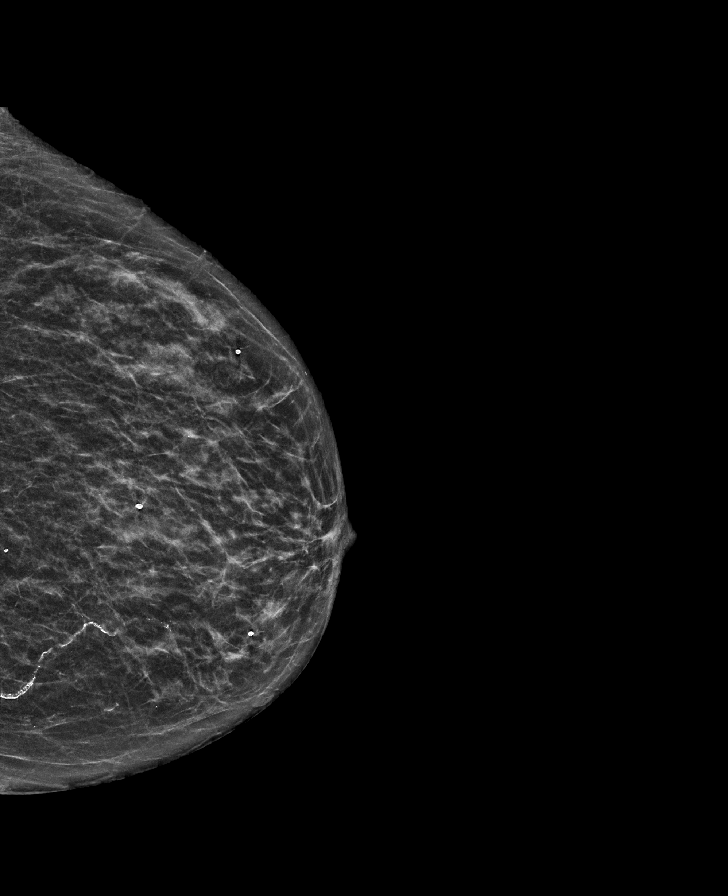

[R MLO synth-2D]
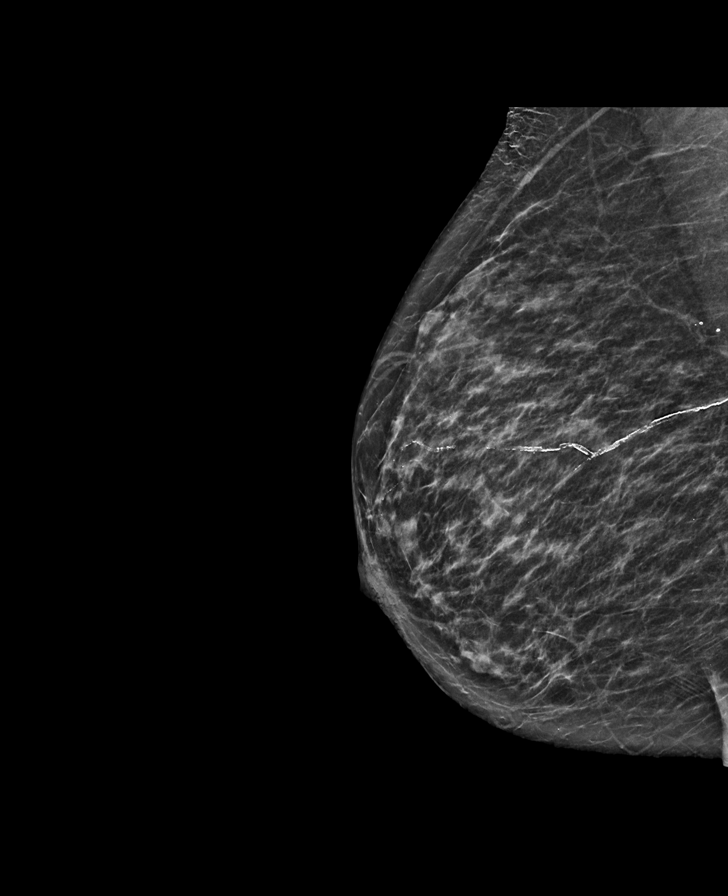

[R MLO tomo · tomo slice 30/59.0]
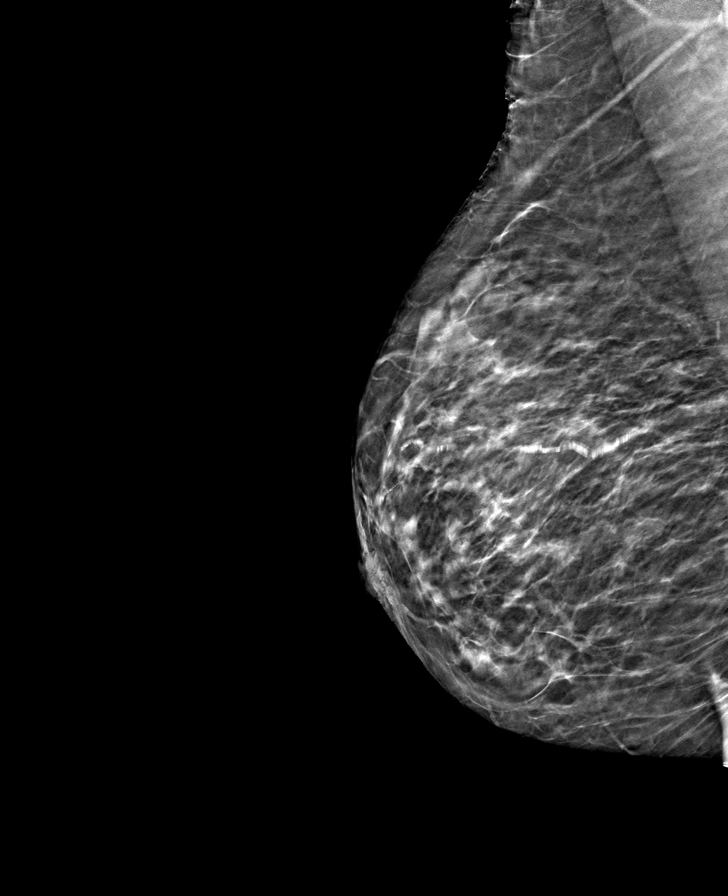

[L CC tomo · tomo slice 27/53.0]
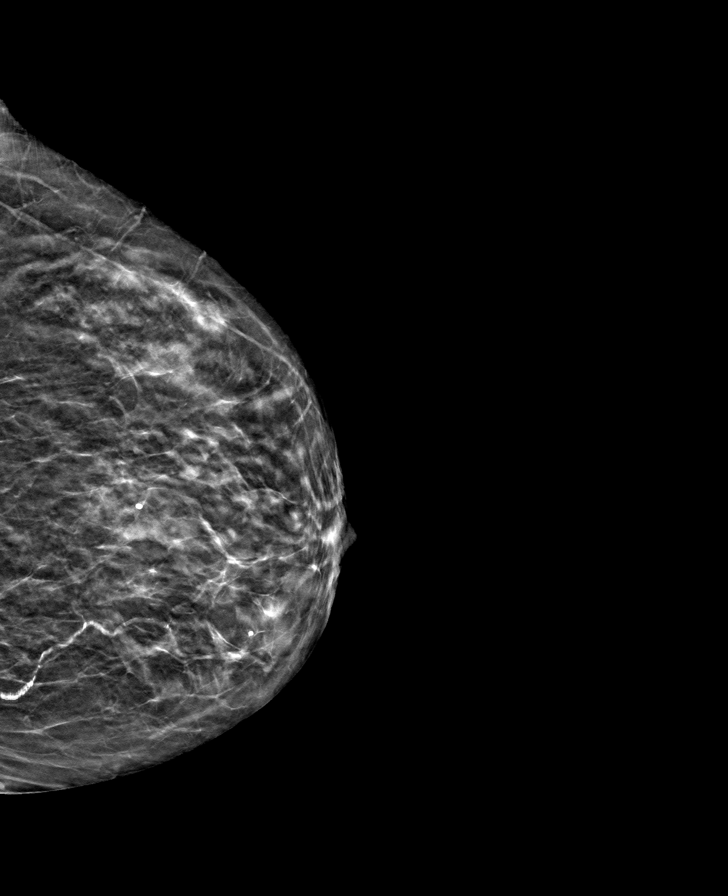

[R CC tomo · tomo slice 31/61.0]
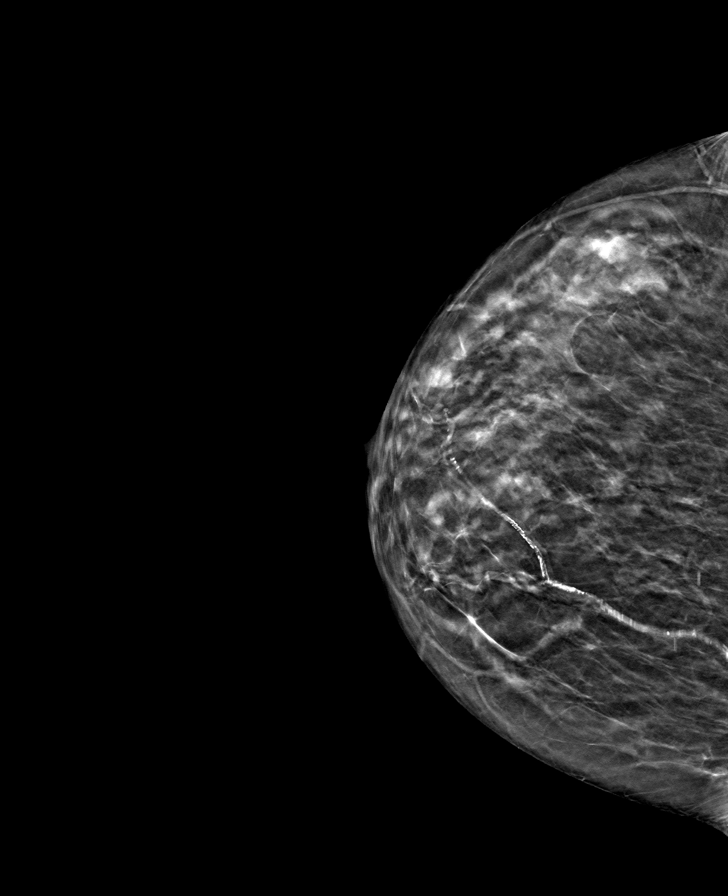

[L MLO tomo · tomo slice 28/55.0]
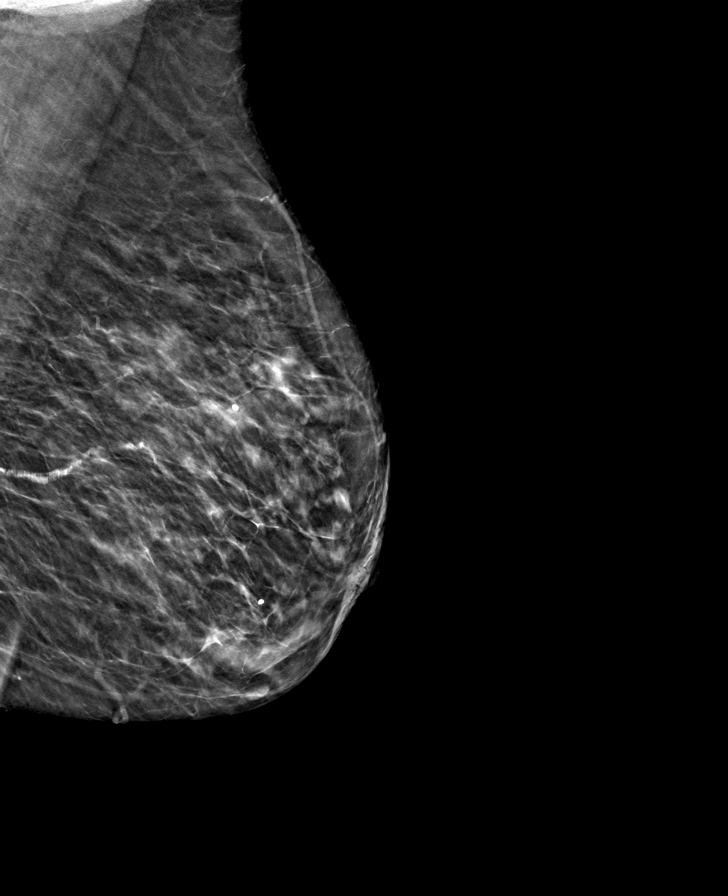

[8 of 24 positions shown; findings below may reference images not displayed]

ACR Breast Density Category b: There are scattered areas of
fibroglandular density.
FINDINGS: There are no findings suspicious for malignancy.
IMPRESSION: No mammographic evidence of malignancy. A result letter of this
screening mammogram will be mailed directly to the patient.

RECOMMENDATION:
Screening mammogram in one year. (Code:51-O-LD2)

BI-RADS CATEGORY  1: Negative.
